# Patient Record
Sex: Male | Born: 1935 | Race: White | Hispanic: No | Marital: Married | Smoking: Former smoker
Health system: Southern US, Community
[De-identification: ages and names within clinical notes are randomized; demographics above are authoritative.]

## PROBLEM LIST (undated history)

## (undated) DIAGNOSIS — I10 Essential (primary) hypertension: Secondary | ICD-10-CM

## (undated) DIAGNOSIS — H3530 Unspecified macular degeneration: Secondary | ICD-10-CM

## (undated) DIAGNOSIS — G629 Polyneuropathy, unspecified: Secondary | ICD-10-CM

## (undated) DIAGNOSIS — T148XXA Other injury of unspecified body region, initial encounter: Secondary | ICD-10-CM

## (undated) DIAGNOSIS — S0291XA Unspecified fracture of skull, initial encounter for closed fracture: Secondary | ICD-10-CM

## (undated) DIAGNOSIS — J40 Bronchitis, not specified as acute or chronic: Secondary | ICD-10-CM

## (undated) DIAGNOSIS — E1142 Type 2 diabetes mellitus with diabetic polyneuropathy: Secondary | ICD-10-CM

## (undated) DIAGNOSIS — E7800 Pure hypercholesterolemia, unspecified: Secondary | ICD-10-CM

## (undated) DIAGNOSIS — M5030 Other cervical disc degeneration, unspecified cervical region: Secondary | ICD-10-CM

## (undated) DIAGNOSIS — S065X9A Traumatic subdural hemorrhage with loss of consciousness of unspecified duration, initial encounter: Secondary | ICD-10-CM

## (undated) DIAGNOSIS — S065XAA Traumatic subdural hemorrhage with loss of consciousness status unknown, initial encounter: Secondary | ICD-10-CM

## (undated) DIAGNOSIS — R911 Solitary pulmonary nodule: Secondary | ICD-10-CM

## (undated) DIAGNOSIS — I723 Aneurysm of iliac artery: Secondary | ICD-10-CM

## (undated) DIAGNOSIS — R269 Unspecified abnormalities of gait and mobility: Secondary | ICD-10-CM

## (undated) DIAGNOSIS — J449 Chronic obstructive pulmonary disease, unspecified: Secondary | ICD-10-CM

## (undated) HISTORY — DX: Other injury of unspecified body region, initial encounter: T14.8XXA

## (undated) HISTORY — DX: Traumatic subdural hemorrhage with loss of consciousness status unknown, initial encounter: S06.5XAA

## (undated) HISTORY — DX: Type 2 diabetes mellitus with diabetic polyneuropathy: E11.42

## (undated) HISTORY — DX: Unspecified macular degeneration: H35.30

## (undated) HISTORY — DX: Traumatic subdural hemorrhage with loss of consciousness of unspecified duration, initial encounter: S06.5X9A

## (undated) HISTORY — DX: Chronic obstructive pulmonary disease, unspecified: J44.9

## (undated) HISTORY — DX: Aneurysm of iliac artery: I72.3

## (undated) HISTORY — DX: Solitary pulmonary nodule: R91.1

## (undated) HISTORY — DX: Polyneuropathy, unspecified: G62.9

## (undated) HISTORY — DX: Pure hypercholesterolemia, unspecified: E78.00

## (undated) HISTORY — DX: Unspecified abnormalities of gait and mobility: R26.9

## (undated) HISTORY — DX: Other cervical disc degeneration, unspecified cervical region: M50.30

## (undated) NOTE — *Deleted (*Deleted)
Below is our plan:  We will ***  Please make sure you are staying well hydrated. I recommend 50-60 ounces daily. Well balanced diet and regular exercise encouraged.    Please continue follow up with care team as directed.   Follow up   You may receive a survey regarding today's visit. I encourage you to leave honest feed back as I do use this information to improve patient care. Thank you for seeing me today!       Peripheral Neuropathy Peripheral neuropathy is a type of nerve damage. It affects nerves that carry signals between the spinal cord and the arms, legs, and the rest of the body (peripheral nerves). It does not affect nerves in the spinal cord or brain. In peripheral neuropathy, one nerve or a group of nerves may be damaged. Peripheral neuropathy is a broad category that includes many specific nerve disorders, like diabetic neuropathy, hereditary neuropathy, and carpal tunnel syndrome. What are the causes? This condition may be caused by:  Diabetes. This is the most common cause of peripheral neuropathy.  Nerve injury.  Pressure or stress on a nerve that lasts a long time.  Lack (deficiency) of B vitamins. This can result from alcoholism, poor diet, or a restricted diet.  Infections.  Autoimmune diseases, such as rheumatoid arthritis and systemic lupus erythematosus.  Nerve diseases that are passed from parent to child (inherited).  Some medicines, such as cancer medicines (chemotherapy).  Poisonous (toxic) substances, such as lead and mercury.  Too little blood flowing to the legs.  Kidney disease.  Thyroid disease. In some cases, the cause of this condition is not known. What are the signs or symptoms? Symptoms of this condition depend on which of your nerves is damaged. Common symptoms include:  Loss of feeling (numbness) in the feet, hands, or both.  Tingling in the feet, hands, or both.  Burning pain.  Very sensitive skin.  Weakness.  Not  being able to move a part of the body (paralysis).  Muscle twitching.  Clumsiness or poor coordination.  Loss of balance.  Not being able to control your bladder.  Feeling dizzy.  Sexual problems. How is this diagnosed? Diagnosing and finding the cause of peripheral neuropathy can be difficult. Your health care provider will take your medical history and do a physical exam. A neurological exam will also be done. This involves checking things that are affected by your brain, spinal cord, and nerves (nervous system). For example, your health care provider will check your reflexes, how you move, and what you can feel. You may have other tests, such as:  Blood tests.  Electromyogram (EMG) and nerve conduction tests. These tests check nerve function and how well the nerves are controlling the muscles.  Imaging tests, such as CT scans or MRI to rule out other causes of your symptoms.  Removing a small piece of nerve to be examined in a lab (nerve biopsy). This is rare.  Removing and examining a small amount of the fluid that surrounds the brain and spinal cord (lumbar puncture). This is rare. How is this treated? Treatment for this condition may involve:  Treating the underlying cause of the neuropathy, such as diabetes, kidney disease, or vitamin deficiencies.  Stopping medicines that can cause neuropathy, such as chemotherapy.  Medicine to relieve pain. Medicines may include: ? Prescription or over-the-counter pain medicine. ? Antiseizure medicine. ? Antidepressants. ? Pain-relieving patches that are applied to painful areas of skin.  Surgery to relieve pressure on a  nerve or to destroy a nerve that is causing pain.  Physical therapy to help improve movement and balance.  Devices to help you move around (assistive devices). Follow these instructions at home: Medicines  Take over-the-counter and prescription medicines only as told by your health care provider. Do not take  any other medicines without first asking your health care provider.  Do not drive or use heavy machinery while taking prescription pain medicine. Lifestyle   Do not use any products that contain nicotine or tobacco, such as cigarettes and e-cigarettes. Smoking keeps blood from reaching damaged nerves. If you need help quitting, ask your health care provider.  Avoid or limit alcohol. Too much alcohol can cause a vitamin B deficiency, and vitamin B is needed for healthy nerves.  Eat a healthy diet. This includes: ? Eating foods that are high in fiber, such as fresh fruits and vegetables, whole grains, and beans. ? Limiting foods that are high in fat and processed sugars, such as fried or sweet foods. General instructions   If you have diabetes, work closely with your health care provider to keep your blood sugar under control.  If you have numbness in your feet: ? Check every day for signs of injury or infection. Watch for redness, warmth, and swelling. ? Wear padded socks and comfortable shoes. These help protect your feet.  Develop a good support system. Living with peripheral neuropathy can be stressful. Consider talking with a mental health specialist or joining a support group.  Use assistive devices and attend physical therapy as told by your health care provider. This may include using a walker or a cane.  Keep all follow-up visits as told by your health care provider. This is important. Contact a health care provider if:  You have new signs or symptoms of peripheral neuropathy.  You are struggling emotionally from dealing with peripheral neuropathy.  Your pain is not well-controlled. Get help right away if:  You have an injury or infection that is not healing normally.  You develop new weakness in an arm or leg.  You fall frequently. Summary  Peripheral neuropathy is when the nerves in the arms, or legs are damaged, resulting in numbness, weakness, or pain.  There  are many causes of peripheral neuropathy, including diabetes, pinched nerves, vitamin deficiencies, autoimmune disease, and hereditary conditions.  Diagnosing and finding the cause of peripheral neuropathy can be difficult. Your health care provider will take your medical history, do a physical exam, and do tests, including blood tests and nerve function tests.  Treatment involves treating the underlying cause of the neuropathy and taking medicines to help control pain. Physical therapy and assistive devices may also help. This information is not intended to replace advice given to you by your health care provider. Make sure you discuss any questions you have with your health care provider. Document Revised: 06/13/2017 Document Reviewed: 09/09/2016 Elsevier Patient Education  2020 [employer].

---

## 1999-02-21 ENCOUNTER — Encounter: Admission: RE | Admit: 1999-02-21 | Discharge: 1999-05-22 | Payer: Self-pay | Admitting: Emergency Medicine

## 2000-06-25 ENCOUNTER — Encounter: Admission: RE | Admit: 2000-06-25 | Discharge: 2000-06-25 | Payer: Self-pay | Admitting: General Surgery

## 2000-06-25 ENCOUNTER — Encounter: Payer: Self-pay | Admitting: General Surgery

## 2001-02-03 ENCOUNTER — Other Ambulatory Visit: Admission: RE | Admit: 2001-02-03 | Discharge: 2001-02-03 | Payer: Self-pay | Admitting: Urology

## 2001-07-31 ENCOUNTER — Ambulatory Visit (HOSPITAL_COMMUNITY): Admission: RE | Admit: 2001-07-31 | Discharge: 2001-07-31 | Payer: Self-pay | Admitting: Gastroenterology

## 2005-09-12 ENCOUNTER — Encounter: Admission: RE | Admit: 2005-09-12 | Discharge: 2005-09-12 | Payer: Self-pay | Admitting: Family Medicine

## 2005-10-23 ENCOUNTER — Encounter: Admission: RE | Admit: 2005-10-23 | Discharge: 2005-10-23 | Payer: Self-pay | Admitting: Emergency Medicine

## 2005-12-04 ENCOUNTER — Encounter: Admission: RE | Admit: 2005-12-04 | Discharge: 2005-12-04 | Payer: Self-pay | Admitting: Emergency Medicine

## 2006-01-26 ENCOUNTER — Encounter: Admission: RE | Admit: 2006-01-26 | Discharge: 2006-01-26 | Payer: Self-pay | Admitting: Family Medicine

## 2008-02-01 ENCOUNTER — Inpatient Hospital Stay (HOSPITAL_COMMUNITY): Admission: EM | Admit: 2008-02-01 | Discharge: 2008-02-05 | Payer: Self-pay | Admitting: Emergency Medicine

## 2008-02-02 ENCOUNTER — Ambulatory Visit: Payer: Self-pay | Admitting: Physical Medicine & Rehabilitation

## 2008-03-09 ENCOUNTER — Encounter: Admission: RE | Admit: 2008-03-09 | Discharge: 2008-03-09 | Payer: Self-pay | Admitting: Neurological Surgery

## 2008-06-27 ENCOUNTER — Encounter: Admission: RE | Admit: 2008-06-27 | Discharge: 2008-07-14 | Payer: Self-pay | Admitting: Emergency Medicine

## 2008-07-18 ENCOUNTER — Encounter: Admission: RE | Admit: 2008-07-18 | Discharge: 2008-10-16 | Payer: Self-pay | Admitting: Emergency Medicine

## 2008-10-18 ENCOUNTER — Encounter: Admission: RE | Admit: 2008-10-18 | Discharge: 2008-10-18 | Payer: Self-pay | Admitting: Neurology

## 2008-10-19 ENCOUNTER — Encounter: Admission: RE | Admit: 2008-10-19 | Discharge: 2008-10-19 | Payer: Self-pay | Admitting: Neurology

## 2008-12-07 ENCOUNTER — Encounter: Admission: RE | Admit: 2008-12-07 | Discharge: 2008-12-07 | Payer: Self-pay | Admitting: Neurology

## 2009-02-15 ENCOUNTER — Encounter: Admission: RE | Admit: 2009-02-15 | Discharge: 2009-03-21 | Payer: Self-pay | Admitting: Neurology

## 2009-11-05 ENCOUNTER — Emergency Department (HOSPITAL_BASED_OUTPATIENT_CLINIC_OR_DEPARTMENT_OTHER): Admission: EM | Admit: 2009-11-05 | Discharge: 2009-11-05 | Payer: Self-pay | Admitting: Emergency Medicine

## 2009-11-09 ENCOUNTER — Ambulatory Visit: Payer: Self-pay | Admitting: Diagnostic Radiology

## 2009-11-09 ENCOUNTER — Emergency Department (HOSPITAL_BASED_OUTPATIENT_CLINIC_OR_DEPARTMENT_OTHER): Admission: EM | Admit: 2009-11-09 | Discharge: 2009-11-09 | Payer: Self-pay | Admitting: Emergency Medicine

## 2010-10-02 LAB — CBC
HCT: 42.5 % (ref 39.0–52.0)
Hemoglobin: 14.5 g/dL (ref 13.0–17.0)
MCHC: 34.3 g/dL (ref 30.0–36.0)
MCV: 99.6 fL (ref 78.0–100.0)
Platelets: 238 K/uL (ref 150–400)
RBC: 4.26 MIL/uL (ref 4.22–5.81)
RDW: 12.4 % (ref 11.5–15.5)
WBC: 8.8 10*3/uL (ref 4.0–10.5)

## 2010-10-02 LAB — BASIC METABOLIC PANEL WITH GFR
CO2: 25 meq/L (ref 19–32)
Calcium: 9.4 mg/dL (ref 8.4–10.5)
Creatinine, Ser: 0.6 mg/dL (ref 0.4–1.5)
GFR calc Af Amer: >60 mL/min (ref >60)
GFR calc non Af Amer: >60 mL/min (ref >60)
Glucose, Bld: 160 mg/dL — ABNORMAL HIGH (ref 70–99)
Potassium: 4.9 meq/L (ref 3.5–5.1)

## 2010-10-02 LAB — DIFFERENTIAL
Basophils Absolute: 0 K/uL (ref 0.0–0.1)
Basophils Relative: 1 % (ref 0–1)
Eosinophils Absolute: 0 10*3/uL (ref 0.0–0.7)
Eosinophils Relative: 0 % (ref 0–5)
Lymphocytes Relative: 15 % (ref 12–46)
Lymphs Abs: 1.3 K/uL (ref 0.7–4.0)
Monocytes Absolute: 0.6 10*3/uL (ref 0.1–1.0)
Monocytes Relative: 7 % (ref 3–12)
Neutro Abs: 6.9 10*3/uL (ref 1.7–7.7)
Neutrophils Relative %: 77 % (ref 43–77)

## 2010-10-02 LAB — POCT B-TYPE NATRIURETIC PEPTIDE (BNP): B Natriuretic Peptide, POC: 12 pg/mL (ref 0–100)

## 2010-10-02 LAB — BASIC METABOLIC PANEL
BUN: 22 mg/dL (ref 6–23)
Chloride: 98 mEq/L (ref 96–112)
Sodium: 135 mEq/L (ref 135–145)

## 2010-10-02 LAB — POCT CARDIAC MARKERS
CKMB, poc: 1 ng/mL (ref 1.0–8.0)
Myoglobin, poc: 78.1 ng/mL (ref 12–200)
Troponin i, poc: <0.05 ng/mL (ref 0.00–0.09)

## 2010-11-27 NOTE — Discharge Summary (Signed)
NAMELORENCE, [name]              ACCOUNT NO.:  [id number]   MEDICAL RECORD NO.:  [id number]          PATIENT TYPE:  INP   LOCATION:  3001                         FACILITY:  [facility]   PHYSICIAN:  Stefani Dama, M.D.  DATE OF BIRTH:  [birth year]   DATE OF ADMISSION:  01/31/2008  DATE OF DISCHARGE:  02/05/2008                               DISCHARGE SUMMARY   ADMITTING DIAGNOSES:  1. Left subdural hematoma with tentorial subdural hematoma.  2. Right skull fracture.  3. Alcohol intoxication.  4. Multiple small facial abrasions and a contusion to the right eye.  5. Right elbow contusion.   DISCHARGE DIAGNOSES:  1. Left subdural hematoma with tentorial subdural hematoma.  2. Right skull fracture.  3. Alcohol intoxication.  4. Multiple small facial abrasions and a contusion to the right eye.  5. Right elbow contusion.  6. Left temporal contusion of the brain.   SECONDARY DIAGNOSES:  1. Tobacco abuse.  2. Alcohol abuse.  3. History of hypertension.   OPERATIONS AND PROCEDURES:  None.   BRIEF HISTORY:  The patient is a 75 year old male who fell while taking  out the garbage.  Upon arrival to the emergency room, had a blood  alcohol level of 177.  Brought by EMS, the patient was disoriented and  combative.  CT scan showed a right frontal skull fracture, left subdural  hematoma and tentorial subdural hematoma.  He was admitted to the  neurosurgical ICU.  He was oriented, but repeating himself continuously.  On the first day of his hospitalization, he was doing a little bit  better.  He was neurovascularly intact.  Cranial nerves 2-12 are intact.  He did have some mild double vision that resolved after 2 to 3 days.  His right eye was significantly swollen with sclera injected with blood  and a facial abrasion.  He was admitted for observation.  Physical  therapy and occupational therapy was consulted.  He was sent for a new  CT scan on February 01, 2008.  The new CT scan showed a left  temporal  contusion to the brain.  No change in the subdural hematoma.  No new  bleeding otherwise.  He continued to be observed as he was transferred  to 3000 out of the neurosurgical ICU as he was neurologically stable.  A  discharge plan was arranged.  He was found to need assistance, rehab  versus skilled nursing facility due to insurance issues.  He was not  accepted to inpatient rehab, and it was felt best that he would benefit  from short stay with skilled nursing facility due to his slow  progression with physical therapy and occupational therapy.  He was  eating well, voiding well.  Resolution of his double vision has  occurred.  He is ready for discharge on February 05, 2008.   DISCHARGE CONDITION:  Stable and improved.   DISCHARGE INSTRUCTIONS:  Discharge to skilled nursing facility,  [facility].  Priority discharge summary dictated.  Continue physical therapy and occupational therapy for general  conditioning, coordination, balance, gait training, and activities of  daily living.  He will follow up with Dr. Danielle Dess in 3 to 4 weeks as an  outpatient.  He was complaining of some mild left ear problems, which he  has had prior to this fall.  He will see an ENT if this continues to  bother him.  This can be done as an outpatient as well.   DISCHARGE MEDICATIONS:  1. Lisinopril 25 mg q. a.m.  2  Advair 250 mg 1 puff q. a.m. and q. p.m.  1. Metoprolol 35 mg p.o. q. a.m.  2. Multivitamin p.o. daily.  3. An 81 mg aspirin q. a.m.  4. Tylenol #3 one p.o. q.6 to 8 hours p.r.n. pain.  5. The patient had been placed on Ativan protocol during his      hospitalization due to his alcohol intoxication.   He is alert and oriented at the time of discharge.  Regular diet.  Contact our office prior to follow up if they have any questions or  concerns.      Aura Fey Bobbe Medico.      Stefani Dama, M.D.  Electronically Signed    SCI/MEDQ  D:  02/05/2008  T:   02/05/2008  Job:  [id number]

## 2010-11-27 NOTE — H&P (Signed)
[name], James NO.:  [id number]   MEDICAL RECORD NO.:  [id number]          PATIENT TYPE:  INP   LOCATION:                                FACILITY:  [facility]   PHYSICIAN:  Stefani Dama, M.D.  DATE OF BIRTH:  1935-09-01   DATE OF ADMISSION:  02/01/2008  DATE OF DISCHARGE:                              HISTORY & PHYSICAL   ADMITTING DIAGNOSES:  1. Acute subdural hematoma on the left with tentorial subdural      hematoma.  2. Right frontal skull fracture.  3. Alcohol intoxication.   HISTORY OF PRESENT ILLNESS:  [name] is a 75 year old right-  handed Hispanic male who apparently was taking out the garbage this  evening when he apparently tripped and fell, striking his head and his  right elbow, causing a gash onto the right posterior occiput.  He was  seen in the emergency department where his lacerations were sewn, the  contusions were evaluated, and a CT scan of the brain was performed.  CT  scan demonstrates presence of a left-sided subdural hematoma over the  temporoparietal convexity.  This measures 6 mm in maximum thickness.  There is blood over the tentorium also on the left side.  There is a  right-sided skull fracture which is nondisplaced in the frontotemporal  region.  The patient is now admitted to the hospital for further  monitoring.  On initial evaluation in the emergency department, it was  noted that the patient was combative, disoriented, and agitated.  He  gradually settled down.  His initial blood alcohol level was 177.   PAST MEDICAL HISTORY:  Significant for smoking 1 pack cigarettes a day  and drinks scotch on a daily basis.  He has an underlying history of  hypertension.   CURRENT MEDICATIONS:  Metoprolol, lisinopril, Advair, and aspirin.  He  also notes that he uses a multivitamin.   SOCIAL HISTORY:  He is married and lives with his wife who accompanies  him on this visit.   PHYSICAL EXAMINATION:  GENERAL:  At the current,  he is alert and  oriented, though he constantly repeats himself, complains of headache  and head pain, requesting pain medication.  HEENT:  He has abrasions and a sewn laceration along her right parieto-  occipital region over the scalp.  He has injection of the sclerae on the  right side with ecchymoses that is forming over the lateral aspect of  the orbit in the zygomatic region.  His pupils are 3 mm equally and  reactive to light and accommodation.  Extraocular movements appear full.  Face is symmetric.  Tongue and uvula are midline.  Sclerae and  conjunctivae are as noted with injection on the right side.  NECK:  Supple.  Range of motion is good.  MUSCULOSKELETAL:  His motor strength in the upper and lower extremities  is within the limits of normal.  Deep tendon reflexes are 1+ in the  biceps and triceps, 1+ in the patellae and the Achilles.  Babinski are  downgoing.  There is a significant bruise and contusion about the  right  elbow.  He complains of some chest wall pain.  LUNGS:  Clear to auscultation.  HEART:  Regular rate and rhythm.  No murmurs are heard.  ABDOMEN:  Soft.  Bowel sounds are positive.  No masses are noted.  EXTREMITIES:  No cyanosis, clubbing, or edema, although there is a  number of scrapes in and about the knees and the shins on both lower  extremities.   IMPRESSION:  The patient has evidence of a subdural hematoma and a skull  fracture secondary to an apparent fall.  He has also evidence of alcohol  intoxication with a blood alcohol level of 177.  He is now being  admitted for observation.  A repeat CT scan will be obtained.  The  patient's wife is questioning whether the fall could be secondary to a  mini-stroke or some other abnormality.  I explained that the most likely  source is related to the alcohol usage and I discouraged this for the  near time as the patient needs to recover from his subdural hematoma.  I  explained to them the risk of seizure with  the presence of closed head  injury including some blood on the surface of the brain.  These issues  will be reinforced in the future.      Stefani Dama, M.D.  Electronically Signed     HJE/MEDQ  D:  02/01/2008  T:  02/01/2008  Job:  [id number]

## 2010-11-30 NOTE — Procedures (Signed)
[facility]  Patient:    [name], [name] Visit Number: [id number] MRN: [id number]          Service Type: END Location: ENDO Attending Physician:  Rich Brave Dictated by:   Florencia Reasons, M.D. Proc. Date: 07/31/01 Admit Date:  07/31/2001 Discharge Date: 07/31/2001   CC:         Onalee Hua B. Georgina Pillion, M.D.   Procedure Report  PROCEDURE:  Colonoscopy.  INDICATION:  Screening for colon cancer in an asymptomatic 75 year old.  FINDINGS:  Normal exam to the cecum.  DESCRIPTION OF PROCEDURE:  The nature, purpose, and risks of the procedure had been described to the patient, who provided written consent.  Sedation was fentanyl 87.5 mcg and Versed 10 mg IV without arrhythmias or desaturation. There were some perianal skin tags, but these did not look like condylomata to me.  Digital exam of the prostate was normal.  The Olympus adult video colonoscope was advanced quite easily to the cecum, turning the patient into the supine position, applying some external abdominal compression, and overriding and taking out loops to reach the base of the cecum.  Pullback was then performed.  The quality of the prep was excellent, and it is felt that all areas were well seen.  This was a normal examination.  No polyps, cancer, colitis, vascular malformations, or diverticular disease were observed, and retroflexion of the rectum was normal.  No biopsies were obtained.  The patient tolerated the procedure well, and there were no apparent complications.  IMPRESSION:  Normal screening colonoscopy.  PLAN:  Follow-up colonoscopy in five years. Dictated by:   Florencia Reasons, M.D. Attending Physician:  Rich Brave DD:  07/31/01 TD:  08/02/01 Job: [id number]

## 2011-04-12 LAB — CBC
MCV: 99.2
RDW: 12.6
RDW: 13.2
WBC: 10.9 — ABNORMAL HIGH
WBC: 13.2 — ABNORMAL HIGH

## 2011-04-12 LAB — PROTIME-INR
INR: 1
Prothrombin Time: 13.6

## 2011-04-12 LAB — ETHANOL: Alcohol, Ethyl (B): 177 — ABNORMAL HIGH

## 2011-04-12 LAB — BASIC METABOLIC PANEL: GFR calc non Af Amer: >60

## 2011-04-24 ENCOUNTER — Other Ambulatory Visit: Payer: Self-pay | Admitting: Family Medicine

## 2011-04-24 ENCOUNTER — Ambulatory Visit
Admission: RE | Admit: 2011-04-24 | Discharge: 2011-04-24 | Disposition: A | Discharge status: Home / Self Care | Payer: Medicare Other | Source: Ambulatory Visit | Attending: Family Medicine | Admitting: Family Medicine

## 2011-04-24 DIAGNOSIS — G8929 Other chronic pain: Secondary | ICD-10-CM

## 2011-04-24 DIAGNOSIS — M542 Cervicalgia: Secondary | ICD-10-CM

## 2011-08-03 ENCOUNTER — Emergency Department (HOSPITAL_BASED_OUTPATIENT_CLINIC_OR_DEPARTMENT_OTHER)
Admission: EM | Admit: 2011-08-03 | Discharge: 2011-08-03 | Disposition: A | Discharge status: Home / Self Care | Payer: Medicare Other | Attending: Emergency Medicine | Admitting: Emergency Medicine

## 2011-08-03 ENCOUNTER — Encounter (HOSPITAL_BASED_OUTPATIENT_CLINIC_OR_DEPARTMENT_OTHER): Payer: Self-pay | Admitting: *Deleted

## 2011-08-03 ENCOUNTER — Emergency Department (INDEPENDENT_AMBULATORY_CARE_PROVIDER_SITE_OTHER): Payer: Medicare Other

## 2011-08-03 DIAGNOSIS — R0602 Shortness of breath: Secondary | ICD-10-CM

## 2011-08-03 DIAGNOSIS — E119 Type 2 diabetes mellitus without complications: Secondary | ICD-10-CM | POA: Insufficient documentation

## 2011-08-03 DIAGNOSIS — R05 Cough: Secondary | ICD-10-CM

## 2011-08-03 DIAGNOSIS — I10 Essential (primary) hypertension: Secondary | ICD-10-CM | POA: Insufficient documentation

## 2011-08-03 DIAGNOSIS — J45909 Unspecified asthma, uncomplicated: Secondary | ICD-10-CM | POA: Insufficient documentation

## 2011-08-03 DIAGNOSIS — F17200 Nicotine dependence, unspecified, uncomplicated: Secondary | ICD-10-CM | POA: Insufficient documentation

## 2011-08-03 DIAGNOSIS — J209 Acute bronchitis, unspecified: Secondary | ICD-10-CM | POA: Insufficient documentation

## 2011-08-03 DIAGNOSIS — R059 Cough, unspecified: Secondary | ICD-10-CM

## 2011-08-03 HISTORY — DX: Essential (primary) hypertension: I10

## 2011-08-03 HISTORY — DX: Unspecified fracture of skull, initial encounter for closed fracture: S02.91XA

## 2011-08-03 HISTORY — DX: Bronchitis, not specified as acute or chronic: J40

## 2011-08-03 MED ORDER — ALBUTEROL SULFATE (2.5 MG/3ML) 0.083% IN NEBU: 2.5000 mg | INHALATION_SOLUTION | Freq: Four times a day (QID) | RESPIRATORY_TRACT | Status: DC | PRN

## 2011-08-03 NOTE — ED Provider Notes (Signed)
History     CSN: [id number]  Arrival date & time 08/03/11  1226   None     No chief complaint on file.   (Consider location/radiation/quality/duration/timing/severity/associated sxs/prior treatment) Patient is a 76 y.o. male presenting with cough. The history is provided by the patient. No language interpreter was used.  Cough This is a new problem. The current episode started more than 1 week ago. The problem occurs constantly. The problem has been gradually worsening. The cough is productive of sputum. There has been no fever. Associated symptoms include rhinorrhea, shortness of breath and wheezing. He is not a smoker. His past medical history is significant for bronchitis and asthma.  Pt complains of a cough and congestion.  Pt reports he saw his MD and was started on Levaquin.  Pt reports this is his 6th day of Levaquin.  Pt has been using his wifes albuterol neblizer with some relief.  Past Medical History  Diagnosis Date  . Asthma   . Bronchitis   . Diabetes mellitus   . Hypertension   . Skull fracture     History reviewed. No pertinent past surgical history.  No family history on file.  History  Substance Use Topics  . Smoking status: Current Everyday Smoker    Types: Cigarettes  . Smokeless tobacco: Never Used  . Alcohol Use: 7.0 oz/week    14 drink(s) per week      Review of Systems  HENT: Positive for rhinorrhea.   Respiratory: Positive for cough, shortness of breath and wheezing.   All other systems reviewed and are negative.    Allergies  Penicillins; Prednisone; and Sulfa antibiotics  Home Medications   Current Outpatient Rx  Name Route Sig Dispense Refill  . ALBUTEROL SULFATE HFA 108 (90 BASE) MCG/ACT IN AERS Inhalation Inhale 2 puffs into the lungs every 6 (six) hours as needed.    Marland Kitchen AMLODIPINE BESYLATE 5 MG PO TABS Oral Take 5 mg by mouth daily.    Marland Kitchen FLUTICASONE-SALMETEROL 250-50 MCG/DOSE IN AEPB Inhalation Inhale 1 puff into the lungs every  12 (twelve) hours.    Marland Kitchen HYDROCHLOROTHIAZIDE 12.5 MG PO TABS Oral Take 6.25 mg by mouth daily.    Marland Kitchen LISINOPRIL 20 MG PO TABS Oral Take 20 mg by mouth daily.      BP 134/74  Pulse 89  Temp(Src) 97.9 F (36.6 C) (Oral)  Resp 18  Ht 5\' 11"  (1.803 m)  Wt 200 lb (90.719 kg)  BMI 27.89 kg/m2  SpO2 98%  Physical Exam  Nursing note and vitals reviewed. Constitutional: He is oriented to person, place, and time. He appears well-developed and well-nourished.  HENT:  Head: Normocephalic and atraumatic.  Right Ear: External ear normal.  Left Ear: External ear normal.  Nose: Nose normal.  Mouth/Throat: Oropharynx is clear and moist.  Eyes: Conjunctivae and EOM are normal. Pupils are equal, round, and reactive to light.  Neck: Normal range of motion. Neck supple.  Cardiovascular: Normal rate and normal heart sounds.   Pulmonary/Chest: Effort normal.  Abdominal: Soft. Bowel sounds are normal.  Musculoskeletal: Normal range of motion.  Neurological: He is alert and oriented to person, place, and time. He has normal reflexes.  Skin: Skin is warm.  Psychiatric: He has a normal mood and affect.    ED Course  Procedures (including critical care time)  Labs Reviewed - No data to display No results found.   No diagnosis found.    MDM  Pt advised continue albuterol nebs,  Take  levaquin.  See your Physician for recheck on Monday        Langston Masker, [place] 08/03/11 1518  Langston Masker, [place] 08/03/11 [phone]

## 2011-08-03 NOTE — ED Notes (Signed)
Dx with bronchitis last Sunday and started on levaquin- states feeling SOB- used home neb this morning without relief

## 2011-08-03 NOTE — ED Provider Notes (Signed)
This 76 year old male has been coughing for a week on antibiotics his lungs are currently clear and has only used his wife's nebulizer treatment a few times over the last week and has not had to use his inhaler, he is a nontoxic appearance and is easily speaking full sentences without respiratory distress during my examination.  Medical screening examination/treatment/procedure(s) were conducted as a shared visit with non-physician practitioner(s) and myself.  I personally evaluated the patient during the encounter  Hurman Horn, MD 08/06/11 1651

## 2011-08-03 NOTE — ED Notes (Signed)
Patient expressed concern over wait, informed them of status and that they were next for radiology

## 2011-08-04 NOTE — ED Provider Notes (Signed)
Medical screening examination/treatment/procedure(s) were conducted as a shared visit with non-physician practitioner(s) and myself.  I personally evaluated the patient during the encounter  Hurman Horn, MD 08/04/11 [phone]

## 2012-02-27 ENCOUNTER — Emergency Department (HOSPITAL_BASED_OUTPATIENT_CLINIC_OR_DEPARTMENT_OTHER)
Admission: EM | Admit: 2012-02-27 | Discharge: 2012-02-28 | Disposition: A | Discharge status: Home / Self Care | Payer: Medicare Other | Attending: Emergency Medicine | Admitting: Emergency Medicine

## 2012-02-27 ENCOUNTER — Encounter (HOSPITAL_BASED_OUTPATIENT_CLINIC_OR_DEPARTMENT_OTHER): Payer: Self-pay | Admitting: *Deleted

## 2012-02-27 DIAGNOSIS — J45909 Unspecified asthma, uncomplicated: Secondary | ICD-10-CM | POA: Insufficient documentation

## 2012-02-27 DIAGNOSIS — Z880 Allergy status to penicillin: Secondary | ICD-10-CM | POA: Insufficient documentation

## 2012-02-27 DIAGNOSIS — J40 Bronchitis, not specified as acute or chronic: Secondary | ICD-10-CM | POA: Insufficient documentation

## 2012-02-27 DIAGNOSIS — F17200 Nicotine dependence, unspecified, uncomplicated: Secondary | ICD-10-CM | POA: Insufficient documentation

## 2012-02-27 DIAGNOSIS — Z881 Allergy status to other antibiotic agents status: Secondary | ICD-10-CM | POA: Insufficient documentation

## 2012-02-27 DIAGNOSIS — I10 Essential (primary) hypertension: Secondary | ICD-10-CM | POA: Insufficient documentation

## 2012-02-27 DIAGNOSIS — F1010 Alcohol abuse, uncomplicated: Secondary | ICD-10-CM | POA: Insufficient documentation

## 2012-02-27 DIAGNOSIS — R339 Retention of urine, unspecified: Secondary | ICD-10-CM | POA: Insufficient documentation

## 2012-02-27 DIAGNOSIS — Z7982 Long term (current) use of aspirin: Secondary | ICD-10-CM | POA: Insufficient documentation

## 2012-02-27 DIAGNOSIS — R319 Hematuria, unspecified: Secondary | ICD-10-CM | POA: Insufficient documentation

## 2012-02-27 DIAGNOSIS — E119 Type 2 diabetes mellitus without complications: Secondary | ICD-10-CM | POA: Insufficient documentation

## 2012-02-27 DIAGNOSIS — Z882 Allergy status to sulfonamides status: Secondary | ICD-10-CM | POA: Insufficient documentation

## 2012-02-27 NOTE — ED Notes (Signed)
Hematuria x 3 days. Hx of same off and on x 18 years. Was seen by his urologist yesterday for same. Tonight he is having urgency and urinary retention.

## 2012-02-27 NOTE — ED Provider Notes (Signed)
History     CSN: [id number]  Arrival date & time 02/27/12  2143   First MD Initiated Contact with Patient 02/27/12 2317      Chief Complaint  Patient presents with  . Hematuria    (Consider location/radiation/quality/duration/timing/severity/associated sxs/prior treatment) HPI This is a 76 year old male with 18 year history of intermittent hematuria for which no etiology has ever been determined. He began having hematuria, including the passage of clots, 2 days ago. He was seen in his urologists office yesterday but states at that time there was no blood in his urine. There was some microscopic hematuria noted. This morning he again developed hematuria and passed several clots. This evening after dinner he was unable to urinate and came to the ED here. He was able to void but again developed the feeling that he cannot empty his bladder. He states he feels he needs to void but cannot. There is only minimal discomfort associated with this. He denies fever, chills or abdominal pain.  Past Medical History  Diagnosis Date  . Asthma   . Bronchitis   . Diabetes mellitus   . Hypertension   . Skull fracture     History reviewed. No pertinent past surgical history.  No family history on file.  History  Substance Use Topics  . Smoking status: Current Everyday Smoker    Types: Cigarettes  . Smokeless tobacco: Never Used  . Alcohol Use: 7.0 oz/week    14 drink(s) per week      Review of Systems  All other systems reviewed and are negative.    Allergies  Prednisone; Penicillins; and Sulfa antibiotics  Home Medications   Current Outpatient Rx  Name Route Sig Dispense Refill  . AMLODIPINE BESYLATE 5 MG PO TABS Oral Take 5 mg by mouth daily.    Marland Kitchen VITAMIN C PO Oral Take 1 tablet by mouth daily.    . ASPIRIN 81 MG PO TABS Oral Take 81 mg by mouth daily.    Marland Kitchen CALCIUM CARBONATE ANTACID 500 MG PO CHEW Oral Chew 2 tablets by mouth once as needed. For indigestion    .  FLUTICASONE-SALMETEROL 250-50 MCG/DOSE IN AEPB Inhalation Inhale 1 puff into the lungs every 12 (twelve) hours.    Marland Kitchen HYDROCHLOROTHIAZIDE 12.5 MG PO TABS Oral Take 6.25 mg by mouth daily.    Marland Kitchen LISINOPRIL 20 MG PO TABS Oral Take 20 mg by mouth daily.    Marland Kitchen MENTHOL (TOPICAL ANALGESIC) 7.5 % (ROLL) EX MISC Apply externally Apply 1 each topically daily as needed. For muscle tension    . ADULT MULTIVITAMIN W/MINERALS CH Oral Take 1 tablet by mouth daily.    Marland Kitchen PRESCRIPTION MEDICATION Both Eyes Place 1 drop into both eyes daily as needed. Allergy eye drop    . VITAMIN B-12 1000 MCG PO TABS Oral Take 1,000 mcg by mouth daily.    . ALBUTEROL SULFATE HFA 108 (90 BASE) MCG/ACT IN AERS Inhalation Inhale 2 puffs into the lungs every 6 (six) hours as needed. For shortness of breath    . ALBUTEROL SULFATE (2.5 MG/3ML) 0.083% IN NEBU Nebulization Take 3 mLs (2.5 mg total) by nebulization every 6 (six) hours as needed for wheezing. 75 mL 12    BP 138/75  Pulse 103  Temp 97.9 F (36.6 C) (Oral)  Resp 20  SpO2 98%  Physical Exam General: Well-developed, well-nourished male in no acute distress; appearance consistent with age of record HENT: normocephalic, atraumatic Eyes: Normal appearance Neck: supple Heart: regular rate and  rhythm Lungs: clear to auscultation bilaterally Abdomen: soft; nondistended; nontender; bowel sounds present GU: mildly distended bladder Extremities: No deformity; full range of motion Neurologic: Awake, alert and oriented; motor function intact in all extremities and symmetric; no facial droop Skin: Warm and dry Psychiatric: Normal mood and affect    ED Course  Procedures (including critical care time)     MDM   Nursing notes and vitals signs, including pulse oximetry, reviewed.  Summary of this visit's results, reviewed by myself:  Labs:  Results for orders placed during the hospital encounter of 02/27/12  URINALYSIS, WITH MICROSCOPIC      Component Value Range    Color, Urine RED (*) YELLOW   APPearance CLOUDY (*) CLEAR   Specific Gravity, Urine 1.012  1.005 - 1.030   pH 6.0  5.0 - 8.0   Glucose, UA NEGATIVE  NEGATIVE mg/dL   Hgb urine dipstick LARGE (*) NEGATIVE   Bilirubin Urine NEGATIVE  NEGATIVE   Ketones, ur NEGATIVE  NEGATIVE mg/dL   Protein, ur 161 (*) NEGATIVE mg/dL   Urobilinogen, UA 0.2  0.0 - 1.0 mg/dL   Nitrite NEGATIVE  NEGATIVE   Leukocytes, UA NEGATIVE  NEGATIVE   WBC, UA 0-2  <3 WBC/hpf   RBC / HPF TOO NUMEROUS TO COUNT  <3 RBC/hpf   Bacteria, UA RARE  RARE   Squamous Epithelial / LPF RARE  RARE   12:39 AM Coud catheter placed by nursing staff. 1300 mL postvoid residual was obtained. We'll send patient home with leg bag and have him followup with his urologist, Dr. Isabel Caprice.         Hanley Seamen, MD 02/28/12 [phone]

## 2012-02-28 LAB — URINALYSIS, MICROSCOPIC ONLY
Leukocytes, UA: NEGATIVE
Protein, ur: 100 mg/dL — AB
Specific Gravity, Urine: 1.012 (ref 1.005–1.030)
Urobilinogen, UA: 0.2 mg/dL (ref 0.0–1.0)

## 2012-02-28 NOTE — ED Notes (Signed)
Windell Moulding, RN reviewed care of leg bag with pt and significant other.

## 2012-02-28 NOTE — ED Notes (Signed)
First attempt at foley catheter placement by EMT was unsuccessful. Could not get past the prostate.  Verbal order from Dr. Read Drivers to attempt coude catheter placement by nurse.  Coude inserted successfully on first attempt.  Less than 5 ml urine returned.  Catheter irrigated with approx 60ml NS.  Urine immediately began to flow and 1300 ml red urine returned into gravity drainage bag. Pt tolerated the procedure well.  Expressed instant relief.

## 2012-02-29 LAB — URINE CULTURE
Colony Count: NO GROWTH
Culture: NO GROWTH

## 2012-03-04 ENCOUNTER — Emergency Department (HOSPITAL_BASED_OUTPATIENT_CLINIC_OR_DEPARTMENT_OTHER): Payer: Medicare Other

## 2012-03-04 ENCOUNTER — Emergency Department (HOSPITAL_BASED_OUTPATIENT_CLINIC_OR_DEPARTMENT_OTHER)
Admission: EM | Admit: 2012-03-04 | Discharge: 2012-03-05 | Disposition: A | Discharge status: Home / Self Care | Payer: Medicare Other | Attending: Emergency Medicine | Admitting: Emergency Medicine

## 2012-03-04 ENCOUNTER — Encounter (HOSPITAL_BASED_OUTPATIENT_CLINIC_OR_DEPARTMENT_OTHER): Payer: Self-pay | Admitting: *Deleted

## 2012-03-04 DIAGNOSIS — R739 Hyperglycemia, unspecified: Secondary | ICD-10-CM

## 2012-03-04 DIAGNOSIS — E119 Type 2 diabetes mellitus without complications: Secondary | ICD-10-CM | POA: Insufficient documentation

## 2012-03-04 DIAGNOSIS — R319 Hematuria, unspecified: Secondary | ICD-10-CM | POA: Insufficient documentation

## 2012-03-04 DIAGNOSIS — I10 Essential (primary) hypertension: Secondary | ICD-10-CM | POA: Insufficient documentation

## 2012-03-04 DIAGNOSIS — R109 Unspecified abdominal pain: Secondary | ICD-10-CM | POA: Insufficient documentation

## 2012-03-04 DIAGNOSIS — I723 Aneurysm of iliac artery: Secondary | ICD-10-CM | POA: Insufficient documentation

## 2012-03-04 DIAGNOSIS — E871 Hypo-osmolality and hyponatremia: Secondary | ICD-10-CM | POA: Insufficient documentation

## 2012-03-04 LAB — CBC WITH DIFFERENTIAL/PLATELET
Basophils Absolute: 0.1 10*3/uL (ref 0.0–0.1)
HCT: 35 % — ABNORMAL LOW (ref 39.0–52.0)
Lymphs Abs: 2.2 10*3/uL (ref 0.7–4.0)
MCH: 34.2 pg — ABNORMAL HIGH (ref 26.0–34.0)
MCV: 92.1 fL (ref 78.0–100.0)
Monocytes Absolute: 0.8 10*3/uL (ref 0.1–1.0)
Monocytes Relative: 11 % (ref 3–12)
Neutro Abs: 4 10*3/uL (ref 1.7–7.7)
RDW: 11.9 % (ref 11.5–15.5)
WBC: 7.3 10*3/uL (ref 4.0–10.5)

## 2012-03-04 LAB — BASIC METABOLIC PANEL
BUN: 18 mg/dL (ref 6–23)
CO2: 25 mEq/L (ref 19–32)
Chloride: 93 mEq/L — ABNORMAL LOW (ref 96–112)
Creatinine, Ser: 0.5 mg/dL (ref 0.50–1.35)
GFR calc Af Amer: >90 mL/min (ref >90)

## 2012-03-04 LAB — URINALYSIS, ROUTINE W REFLEX MICROSCOPIC
Nitrite: NEGATIVE
Protein, ur: 100 mg/dL — AB
Specific Gravity, Urine: 1.011 (ref 1.005–1.030)
Urobilinogen, UA: 0.2 mg/dL (ref 0.0–1.0)

## 2012-03-04 LAB — URINE MICROSCOPIC-ADD ON

## 2012-03-04 MED ORDER — DOXYCYCLINE HYCLATE 100 MG PO TABS: 100.0000 mg | ORAL_TABLET | Freq: Once | ORAL | Status: DC

## 2012-03-04 MED ORDER — DOXYCYCLINE HYCLATE 100 MG PO CAPS: 100.0000 mg | ORAL_CAPSULE | Freq: Two times a day (BID) | ORAL | Status: AC

## 2012-03-04 NOTE — ED Notes (Signed)
Pt has foley placed here last week- tonight has hematuria in leg bag, urine clear- appt with urology tomorrow

## 2012-03-04 NOTE — ED Provider Notes (Signed)
History     CSN: [id number]  Arrival date & time 03/04/12  2133   First MD Initiated Contact with Patient 03/04/12 2258      Chief Complaint  Patient presents with  . Hematuria    (Consider location/radiation/quality/duration/timing/severity/associated sxs/prior treatment) Patient is a 76 y.o. male presenting with hematuria. The history is provided by the patient. No language interpreter was used.  Hematuria This is a recurrent (started a week ago but has had several times in the past 11 years never able to find the source) problem. The current episode started in the past 7 days. The problem has been gradually worsening since onset. He describes the hematuria as gross hematuria. The hematuria occurs throughout his entire urinary stream. He reports no clotting in his urine stream. His pain is at a severity of 0/10. He is experiencing no pain. He describes his urine color as light pink (dark red earlier now pink). Obstructive symptoms do not include straining. Pertinent negatives include no dysuria, fever, flank pain, nausea or vomiting. His sexual activity is non-contributory to the current illness. His past medical history is significant for hypertension.  Had foley placed last week for same with retention bleeding initially improved now worse.  No f/c/r.  States urine cytology was negative for malignancy and is scheduled to see Dr. Isabel Caprice in the am.  No rashes on the skin.  Has had clots in the urine but not now.  No abdominal pain no n/v/d.    Past Medical History  Diagnosis Date  . Asthma   . Bronchitis   . Diabetes mellitus   . Hypertension   . Skull fracture     History reviewed. No pertinent past surgical history.  No family history on file.  History  Substance Use Topics  . Smoking status: Current Everyday Smoker    Types: Cigarettes  . Smokeless tobacco: Never Used  . Alcohol Use: 7.0 oz/week    14 drink(s) per week      Review of Systems  Constitutional: Negative  for fever.  Gastrointestinal: Negative for nausea and vomiting.  Genitourinary: Positive for hematuria. Negative for dysuria, flank pain, discharge and penile pain.  All other systems reviewed and are negative.    Allergies  Prednisone; Penicillins; and Sulfa antibiotics  Home Medications   Current Outpatient Rx  Name Route Sig Dispense Refill  . AMLODIPINE BESYLATE 5 MG PO TABS Oral Take 5 mg by mouth daily.    Marland Kitchen VITAMIN C PO Oral Take 1 tablet by mouth daily.    . ASPIRIN 81 MG PO TABS Oral Take 81 mg by mouth daily.    Marland Kitchen CALCIUM CARBONATE ANTACID 500 MG PO CHEW Oral Chew 2 tablets by mouth once as needed. For indigestion    . FLUTICASONE-SALMETEROL 250-50 MCG/DOSE IN AEPB Inhalation Inhale 1 puff into the lungs every 12 (twelve) hours.    Marland Kitchen HYDROCHLOROTHIAZIDE 12.5 MG PO TABS Oral Take 6.25 mg by mouth daily.    Marland Kitchen LISINOPRIL 20 MG PO TABS Oral Take 20 mg by mouth daily.    Marland Kitchen MENTHOL (TOPICAL ANALGESIC) 7.5 % (ROLL) EX MISC Apply externally Apply 1 each topically daily as needed. For muscle tension    . ADULT MULTIVITAMIN W/MINERALS CH Oral Take 1 tablet by mouth daily.    Marland Kitchen PRESCRIPTION MEDICATION Both Eyes Place 1 drop into both eyes daily as needed. Allergy eye drop    . TAMSULOSIN HCL 0.4 MG PO CAPS Oral Take 0.4 mg by mouth daily.    Marland Kitchen  VITAMIN B-12 1000 MCG PO TABS Oral Take 1,000 mcg by mouth daily.    Marland Kitchen DOXYCYCLINE HYCLATE 100 MG PO CAPS Oral Take 1 capsule (100 mg total) by mouth 2 (two) times daily. One po bid x 7 days 14 capsule 0    BP 157/72  Pulse 109  Temp 98.2 F (36.8 C) (Oral)  Resp 18  Ht 5\' 11"  (1.803 m)  Wt 200 lb (90.719 kg)  BMI 27.89 kg/m2  SpO2 98%  Physical Exam  Constitutional: He is oriented to person, place, and time. He appears well-developed and well-nourished. No distress.  HENT:  Head: Normocephalic.  Mouth/Throat: Oropharynx is clear and moist.  Eyes: Conjunctivae are normal. Pupils are equal, round, and reactive to light.  Neck: Normal  range of motion. Neck supple.  Cardiovascular: Normal rate and regular rhythm.   Pulmonary/Chest: Effort normal and breath sounds normal. He has no wheezes. He has no rales.  Abdominal: Soft. Bowel sounds are normal. There is no tenderness. There is no rebound and no guarding.  Musculoskeletal: Normal range of motion.  Neurological: He is alert and oriented to person, place, and time.  Skin: Skin is warm and dry.  Psychiatric: He has a normal mood and affect.    ED Course  Procedures (including critical care time)  Labs Reviewed  URINALYSIS, ROUTINE W REFLEX MICROSCOPIC - Abnormal; Notable for the following:    Color, Urine RED (*)  BIOCHEMICALS MAY BE AFFECTED BY COLOR   Glucose, UA 500 (*)     Hgb urine dipstick LARGE (*)     Protein, ur 100 (*)     Leukocytes, UA SMALL (*)     All other components within normal limits  CBC WITH DIFFERENTIAL - Abnormal; Notable for the following:    RBC 3.80 (*)     HCT 35.0 (*)     MCH 34.2 (*)     MCHC 37.1 (*)  RULED OUT INTERFERING SUBSTANCES   All other components within normal limits  BASIC METABOLIC PANEL - Abnormal; Notable for the following:    Sodium 129 (*)     Chloride 93 (*)     Glucose, Bld 201 (*)     All other components within normal limits  URINE MICROSCOPIC-ADD ON   Ct Abdomen Pelvis Wo Contrast  03/04/2012  *RADIOLOGY REPORT*  Clinical Data: Left flank pain.  Hematuria.  Foley catheter placed last week.  CT ABDOMEN AND PELVIS WITHOUT CONTRAST  Technique:  Multidetector CT imaging of the abdomen and pelvis was performed following the standard protocol without intravenous contrast.  Comparison: None.  Findings: 5 mm nodule in the right lung base is stable since previous chest CT 11/09/2009.  Lung bases are otherwise clear.  Minimal coronary artery calcification.  The kidneys appear symmetrical in size and shape.  No pyelocaliectasis or ureterectasis.  No renal or ureteral stones.  Stone in the gallbladder.  No bile duct  dilatation.  The unenhanced appearance of the liver, spleen, pancreas, and adrenal glands is unremarkable.  Scattered calcification in the abdominal aorta. 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 Retroperitoneal lymph nodes are present but not pathologically enlarged.  A small accessory spleen.  The stomach, small bowel, and colon are decompressed.  Diffusely stool filled colon.  No free air or free fluid in the abdomen.  Small umbilical hernia containing fat.  Pelvis:  Enlarged prostate gland measuring 6.2 x 3.8  cm.  A Foley catheter is in place with the balloon in the bladder base.  There is heterogeneous bladder wall thickening.  There is increased density material within the bladder suggesting hemorrhage.  Small amount of gas in the bladder consistent with catheter insertion. Findings may represent bladder infection versus other cause of bleeding.  Fat inguinal canals bilaterally.  No free or loculated pelvic fluid collections.  No significant pelvic lymphadenopathy. No evidence of diverticulitis.  The appendix is normal. Degenerative changes in the lumbar spine.  IMPRESSION: A Foley catheter in the bladder.  Diffuse bladder wall thickening which may represent infection or hypertrophy.  Increased density material in the bladder suggesting hemorrhage.  No ureteral stones or obstruction.  Incidental note of aneurysms of the right iliac artery and the celiac axis origin.  Cholelithiasis.  Stable right lung base nodule.   Original Report Authenticated By: Marlon Pel, M.D.      1. Hematuria   2. Hyponatremia   3. Hyperglycemia   4. Aneurysm artery, iliac       MDM  Hematuria with bladder hemorrhage and foley in place will treat for cystitis and patient has follow up with Dr. Isabel Caprice at 10 am today.  Patient informed of hyponatremia and states they have been reducing his HCTZ and elevation in sugar, likely  secondary to inflamation or infection.  Follow up with your PMD.  Informed of aneurysm finding and need to follow up with vascular surgery.  Patient and wife verbalize understanding and agree to follow up        Malai Lady Smitty Cords, MD 03/05/12 1610

## 2012-03-05 MED ORDER — DOXYCYCLINE HYCLATE 100 MG PO TABS
ORAL_TABLET | ORAL | Status: AC
  Filled 2012-03-05: qty 1

## 2012-03-12 ENCOUNTER — Encounter: Payer: Self-pay | Admitting: Vascular Surgery

## 2012-03-13 ENCOUNTER — Encounter: Payer: Self-pay | Admitting: Vascular Surgery

## 2012-03-17 ENCOUNTER — Ambulatory Visit (INDEPENDENT_AMBULATORY_CARE_PROVIDER_SITE_OTHER): Payer: Medicare Other | Admitting: Vascular Surgery

## 2012-03-17 ENCOUNTER — Encounter: Payer: Self-pay | Admitting: Vascular Surgery

## 2012-03-17 VITALS — BP 138/78 | HR 90 | Resp 16 | Ht 71.0 in | Wt 199.5 lb

## 2012-03-17 DIAGNOSIS — I723 Aneurysm of iliac artery: Secondary | ICD-10-CM

## 2012-03-17 DIAGNOSIS — I728 Aneurysm of other specified arteries: Secondary | ICD-10-CM

## 2012-03-17 NOTE — Progress Notes (Signed)
Subjective:     Patient ID: [name], male   DOB: 01-26-1936, 76 y.o.   MRN: [id number]  HPI this 76 year old male was referred for evaluation of right iliac artery aneurysm discovered on recent CT scan. Dr. Isabel Caprice was evaluating the patient for hematuria. 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 Patient was referred for further evaluation. He has no history of previous aneurysms and no family history of aortic aneurysms. He has had no abdominal or back symptoms. His hematuria has resolved with antibiotics.  Past Medical History  Diagnosis Date  . Asthma   . Bronchitis   . Diabetes mellitus   . Hypertension   . Skull fracture     History  Substance Use Topics  . Smoking status: Current Everyday Smoker    Types: Cigarettes  . Smokeless tobacco: Never Used  . Alcohol Use: 7.0 oz/week    14 drink(s) per week    History reviewed. No pertinent family history.  Allergies  Allergen Reactions  . Prednisone Other (See Comments)    Sweating with high dose  . Penicillins Rash  . Sulfa Antibiotics Rash    Current outpatient prescriptions:amLODipine (NORVASC) 5 MG tablet, Take 5 mg by mouth daily., Disp: , Rfl: ;  Ascorbic Acid (VITAMIN C PO), Take 1 tablet by mouth daily., Disp: , Rfl: ;  calcium carbonate (TUMS - DOSED IN MG ELEMENTAL CALCIUM) 500 MG chewable tablet, Chew 2 tablets by mouth once as needed. For indigestion, Disp: , Rfl: ;  dutasteride (AVODART) 0.5 MG capsule, Take 0.5 mg by mouth daily., Disp: , Rfl:  Fluticasone-Salmeterol (ADVAIR) 250-50 MCG/DOSE AEPB, Inhale 1 puff into the lungs every 12 (twelve) hours., Disp: , Rfl: ;  lisinopril (PRINIVIL,ZESTRIL) 20 MG tablet, Take 20 mg by mouth daily., Disp: , Rfl: ;  Menthol, Topical Analgesic, (ICY HOT) 7.5 % (ROLL) MISC, Apply 1 each topically daily as needed. For muscle tension, Disp: , Rfl: ;   Multiple Vitamin (MULTIVITAMIN WITH MINERALS) TABS, Take 1 tablet by mouth daily., Disp: , Rfl:  PRESCRIPTION MEDICATION, Place 1 drop into both eyes daily as needed. Allergy eye drop, Disp: , Rfl: ;  Tamsulosin HCl (FLOMAX) 0.4 MG CAPS, Take 0.4 mg by mouth daily., Disp: , Rfl: ;  vitamin B-12 (CYANOCOBALAMIN) 1000 MCG tablet, Take 1,000 mcg by mouth daily., Disp: , Rfl: ;  aspirin 81 MG tablet, Take 81 mg by mouth daily., Disp: , Rfl: ;  hydrochlorothiazide (HYDRODIURIL) 12.5 MG tablet, Take 6.25 mg by mouth daily., Disp: , Rfl:   BP 138/78  Pulse 90  Resp 16  Ht 5\' 11"  (1.803 m)  Wt 199 lb 8 oz (90.493 kg)  BMI 27.82 kg/m2  SpO2 98%  Body mass index is 27.82 kg/(m^2).         Review of Systems denies chest pain, dyspnea on exertion, PND, orthopnea, chronic bronchitis, lateralizing weakness, amaurosis fugax, claudication. 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 Other systems negative and complete review of systems other than episodes of hematuria     Objective:   Physical Exam blood pressure 130/78 heart rate 90 respirations 16 Gen.-alert and oriented x3 in no apparent distress HEENT normal for age Lungs no rhonchi or wheezing Cardiovascular regular rhythm no murmurs carotid pulses 3+ palpable no bruits audible Abdomen soft nontender no palpable masses Musculoskeletal free of  major deformities Skin clear -no  rashes Neurologic normal Lower extremities 3+ femoral and dorsalis pedis pulses palpable bilaterally with no edema  Today I reviewed the medical records provided by Dr Shaune Pollack in the CT scan which was performed 03/04/2012. 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 Unlikely this will ever require surgical treatment.

## 2012-03-18 ENCOUNTER — Telehealth: Payer: Self-pay | Admitting: Vascular Surgery

## 2012-03-18 NOTE — Telephone Encounter (Signed)
Followup - pt had 03/17/12 appt with Dr. Hart Rochester - kf

## 2012-03-18 NOTE — Telephone Encounter (Signed)
Message copied by Margaretmary Eddy on Wed Mar 18, 2012 10:35 AM ------      Message from: [place] M      Created: Mon Mar 09, 2012  1:03 PM      Regarding: RE: patient question (referral)      Contact: [phone]       Please let him know that I haven't rec'd anything.                   ----- Message -----         From: Margaretmary Eddy         Sent: 03/09/2012  12:32 PM           To: Allegra Grana      Subject: patient question (referral)                              Pt called. Wanted to verify that we received a referral for him from [facility] in [place] re: CT they did. Please verify with pt. Thank you!

## 2013-03-19 ENCOUNTER — Other Ambulatory Visit: Payer: Self-pay | Admitting: Vascular Surgery

## 2013-03-22 ENCOUNTER — Encounter: Payer: Self-pay | Admitting: Vascular Surgery

## 2013-03-23 ENCOUNTER — Ambulatory Visit
Admission: RE | Admit: 2013-03-23 | Discharge: 2013-03-23 | Disposition: A | Discharge status: Home / Self Care | Payer: Medicare Other | Source: Ambulatory Visit | Attending: Vascular Surgery | Admitting: Vascular Surgery

## 2013-03-23 ENCOUNTER — Encounter: Payer: Self-pay | Admitting: Vascular Surgery

## 2013-03-23 ENCOUNTER — Ambulatory Visit (INDEPENDENT_AMBULATORY_CARE_PROVIDER_SITE_OTHER): Payer: Medicare Other | Admitting: Vascular Surgery

## 2013-03-23 VITALS — BP 122/70 | HR 83 | Resp 16 | Ht 71.0 in | Wt 195.0 lb

## 2013-03-23 DIAGNOSIS — I723 Aneurysm of iliac artery: Secondary | ICD-10-CM

## 2013-03-23 DIAGNOSIS — I728 Aneurysm of other specified arteries: Secondary | ICD-10-CM

## 2013-03-23 MED ORDER — IOHEXOL 350 MG/ML SOLN
100.0000 mL | Freq: Once | INTRAVENOUS | Status: AC | PRN
  Administered 2013-03-23: 100 mL via INTRAVENOUS

## 2013-03-23 NOTE — Progress Notes (Signed)
Subjective:     Patient ID: [name], male   DOB: 03-23-1936, 77 y.o.   MRN: [id number]  HPI this 77 year old male is seen in continued followup regarding a small right common iliac artery aneurysm discovered one year ago. He has had no abdominal or back symptoms. He also had some mild dilatation of the celiac axis.  Past Medical History  Diagnosis Date  . Asthma   . Bronchitis   . Diabetes mellitus   . Hypertension   . Skull fracture   . Iliac artery aneurysm     History  Substance Use Topics  . Smoking status: Current Every Day Smoker    Types: Cigarettes  . Smokeless tobacco: Never Used  . Alcohol Use: 7.0 oz/week    14 drink(s) per week    Family History  Problem Relation Age of Onset  . Stroke Father     Allergies  Allergen Reactions  . Prednisone Other (See Comments)    Sweating with high dose  . Penicillins Rash  . Sulfa Antibiotics Rash    Current outpatient prescriptions:amLODipine (NORVASC) 5 MG tablet, Take 5 mg by mouth daily., Disp: , Rfl: ;  Ascorbic Acid (VITAMIN C PO), Take 1 tablet by mouth daily., Disp: , Rfl: ;  aspirin 81 MG tablet, Take 81 mg by mouth daily., Disp: , Rfl: ;  atorvastatin (LIPITOR) 10 MG tablet, Take 10 mg by mouth daily., Disp: , Rfl:  calcium carbonate (TUMS - DOSED IN MG ELEMENTAL CALCIUM) 500 MG chewable tablet, Chew 2 tablets by mouth once as needed. For indigestion, Disp: , Rfl: ;  dutasteride (AVODART) 0.5 MG capsule, Take 0.5 mg by mouth daily., Disp: , Rfl: ;  Fluticasone-Salmeterol (ADVAIR) 250-50 MCG/DOSE AEPB, Inhale 1 puff into the lungs every 12 (twelve) hours., Disp: , Rfl: ;  lisinopril (PRINIVIL,ZESTRIL) 20 MG tablet, Take 20 mg by mouth daily., Disp: , Rfl:  Menthol, Topical Analgesic, (ICY HOT) 7.5 % (ROLL) MISC, Apply 1 each topically daily as needed. For muscle tension, Disp: , Rfl: ;  Multiple Vitamin (MULTIVITAMIN WITH MINERALS) TABS, Take 1 tablet by mouth daily., Disp: , Rfl: ;  PRESCRIPTION MEDICATION, Place 1  drop into both eyes daily as needed. Allergy eye drop, Disp: , Rfl: ;  Tamsulosin HCl (FLOMAX) 0.4 MG CAPS, Take 0.4 mg by mouth daily., Disp: , Rfl:  vitamin B-12 (CYANOCOBALAMIN) 1000 MCG tablet, Take 1,000 mcg by mouth daily., Disp: , Rfl: ;  hydrochlorothiazide (HYDRODIURIL) 12.5 MG tablet, Take 6.25 mg by mouth daily., Disp: , Rfl:   BP 122/70  Pulse 83  Resp 16  Ht 5\' 11"  (1.803 m)  Wt 195 lb (88.451 kg)  BMI 27.21 kg/m2  Body mass index is 27.21 kg/(m^2).        \   Review of Systems complains of asthma, occasional hematuria but denies chest pain, dyspnea on exertion, PND, orthopnea, hemoptysis, claudication. All of systems negative and a complete review of systems     Objective:   Physical Exam BP 122/70  Pulse 83  Resp 16  Ht 5\' 11"  (1.803 m)  Wt 195 lb (88.451 kg)  BMI 27.21 kg/m2  Gen.-alert and oriented x3 in no apparent distress HEENT normal for age Lungs no rhonchi or wheezing Cardiovascular regular rhythm no murmurs carotid pulses 3+ palpable no bruits audible Abdomen soft nontender no palpable masses Musculoskeletal free of  major deformities Skin clear -no rashes Neurologic normal Lower extremities 3+ femoral and dorsalis pedis pulses palpable bilaterally with no edema  Data ordered a CT scan with contrast which are reviewed by computer. The right common iliac artery aneurysm is essentially unchanged with approximate 26 mm diameter. The celiac axis has mild dilatation about 15 mm in size.     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

## 2013-03-25 ENCOUNTER — Other Ambulatory Visit: Payer: Self-pay | Admitting: *Deleted

## 2013-03-25 DIAGNOSIS — I723 Aneurysm of iliac artery: Secondary | ICD-10-CM

## 2013-05-05 ENCOUNTER — Other Ambulatory Visit: Payer: Self-pay | Admitting: *Deleted

## 2013-05-05 DIAGNOSIS — I728 Aneurysm of other specified arteries: Secondary | ICD-10-CM

## 2013-05-05 DIAGNOSIS — I723 Aneurysm of iliac artery: Secondary | ICD-10-CM

## 2013-06-21 ENCOUNTER — Ambulatory Visit
Admission: RE | Admit: 2013-06-21 | Discharge: 2013-06-21 | Disposition: A | Discharge status: Home / Self Care | Payer: Medicare Other | Source: Ambulatory Visit | Attending: Family Medicine | Admitting: Family Medicine

## 2013-06-21 ENCOUNTER — Other Ambulatory Visit: Payer: Self-pay | Admitting: Family Medicine

## 2013-06-21 DIAGNOSIS — R0602 Shortness of breath: Secondary | ICD-10-CM

## 2013-07-30 ENCOUNTER — Other Ambulatory Visit: Payer: Self-pay | Admitting: Family Medicine

## 2013-07-30 DIAGNOSIS — J45909 Unspecified asthma, uncomplicated: Secondary | ICD-10-CM

## 2013-09-10 ENCOUNTER — Encounter: Payer: Self-pay | Admitting: *Deleted

## 2013-09-15 ENCOUNTER — Encounter: Payer: Self-pay | Admitting: Neurology

## 2013-09-15 ENCOUNTER — Ambulatory Visit (INDEPENDENT_AMBULATORY_CARE_PROVIDER_SITE_OTHER): Payer: Medicare Other | Admitting: Neurology

## 2013-09-15 VITALS — BP 123/73 | HR 91 | Resp 18 | Ht 71.0 in | Wt 190.0 lb

## 2013-09-15 DIAGNOSIS — R269 Unspecified abnormalities of gait and mobility: Secondary | ICD-10-CM

## 2013-09-15 DIAGNOSIS — G44229 Chronic tension-type headache, not intractable: Secondary | ICD-10-CM

## 2013-09-15 HISTORY — DX: Unspecified abnormalities of gait and mobility: R26.9

## 2013-09-15 MED ORDER — AMITRIPTYLINE HCL 10 MG PO TABS: 10.0000 mg | ORAL_TABLET | Freq: Every day | ORAL | Status: DC

## 2013-09-15 NOTE — Progress Notes (Signed)
[facility]  Provider:  Larey Seat, M D  Referring Provider: Marjorie Smolder, MD Primary Care Physician:  Marjorie Smolder, MD  Chief Complaint  Patient presents with  . New Evaluation    Room 11  . Gait Problem    HPI:  [name] is a 78 y.o. male  Is seen here as a referral   from Dr. Inda Merlin for evaluation of Tension headaches, and progressive gait disorder, walking wide based.   The patient reports a long neurologic history: While still living in [place] 1959 through 1 he had some evaluation for tightness at the ankles and dizziness . He went to the [place], lived in [place] . He had EEG evaluations for ? These were normal. He had again seen neurology for a fall related skull fracture , went for 6 days to ICU under Dr Clarice Pole care in 2009 , diagnosed with a subdural hematoma. He walks with a cane since. He lost left sided hearing , and vertigo -but the fracture was on the right high parietal skull. He likes to sleep on this right. Has hip pain, he reads and works on his computer, noticed that the seated posture provokes the tightness in the neck, he gets dizzy with looking to th right.  He has seen Dr. Melton Alar for tension neck and headaches. He is now retired, daily attending church and he has noted that he develops his tightness once he has been up and about for several minutes.          Review of Systems: Out of a complete 14 system review, the patient complains of only the following symptoms, and all other reviewed systems are negative. Vertigo- dizziness.   History   Social History  . Marital Status: Married    Spouse Name: [name]    Number of Children: 3  . Years of Education: 13   Occupational History  . Not on file.   Social History Main Topics  . Smoking status: Current Every Day Smoker    Types: Cigarettes  . Smokeless tobacco: Never Used  . Alcohol Use: 7.0 oz/week    14 drink(s) per week     Comment: 1-2 before dinner  .  Drug Use: No  . Sexual Activity: Not on file   Other Topics Concern  . Not on file   Social History Narrative   Patient is married [name]).   Patient has three children and 7 grandchildren.   Patient is retired.   Patient drinks one serving of coffee daily.   Patient is right-handed.   Patient has a college education.    Family History  Problem Relation Age of Onset  . Stroke Father     Past Medical History  Diagnosis Date  . Asthma   . Bronchitis   . Diabetes mellitus   . Hypertension   . Skull fracture   . Iliac artery aneurysm   . Peripheral neuropathy   . Hypercholesterolemia   . Subdural hematoma   . Progressive gait disorder   . Progressive gait disorder 09/15/2013    History reviewed. No pertinent past surgical history.  Current Outpatient Prescriptions  Medication Sig Dispense Refill  . amLODipine (NORVASC) 5 MG tablet Take 5 mg by mouth daily.      . Ascorbic Acid (VITAMIN C PO) Take 1 tablet by mouth daily.      Marland Kitchen aspirin 81 MG tablet Take 81 mg by mouth daily.      Marland Kitchen atorvastatin (LIPITOR)  10 MG tablet Take 10 mg by mouth daily.      . Blood Glucose Monitoring Suppl (ONE TOUCH ULTRA 2) W/DEVICE KIT       . calcium carbonate (TUMS - DOSED IN MG ELEMENTAL CALCIUM) 500 MG chewable tablet Chew 2 tablets by mouth once as needed. For indigestion      . dutasteride (AVODART) 0.5 MG capsule Take 0.5 mg by mouth daily.      . Fluticasone-Salmeterol (ADVAIR) 250-50 MCG/DOSE AEPB Inhale 1 puff into the lungs every 12 (twelve) hours.      . hydrochlorothiazide (HYDRODIURIL) 12.5 MG tablet Take 6.25 mg by mouth daily.      Marland Kitchen lisinopril (PRINIVIL,ZESTRIL) 20 MG tablet Take 20 mg by mouth daily.      . Menthol, Topical Analgesic, (ICY HOT) 7.5 % (ROLL) MISC Apply 1 each topically daily as needed. For muscle tension      . metFORMIN (GLUCOPHAGE) 500 MG tablet       . MICROLET LANCETS MISC       . Multiple Vitamin (MULTIVITAMIN WITH MINERALS) TABS Take 1 tablet by mouth  daily.      . ONE TOUCH ULTRA TEST test strip       . PRESCRIPTION MEDICATION Place 1 drop into both eyes daily as needed. Allergy eye drop      . Tamsulosin HCl (FLOMAX) 0.4 MG CAPS Take 0.4 mg by mouth daily.      . vitamin B-12 (CYANOCOBALAMIN) 1000 MCG tablet Take 1,000 mcg by mouth daily.       No current facility-administered medications for this visit.    Allergies as of 09/15/2013 - Review Complete 09/15/2013  Allergen Reaction Noted  . Pneumovax 23 [pneumococcal vac polyvalent]  09/10/2013  . Prednisone Other (See Comments) 08/03/2011  . Penicillins Rash 08/03/2011  . Sulfa antibiotics Rash 08/03/2011    Vitals: BP 123/73  Pulse 91  Resp 18  Ht 5' 11"  (1.803 m)  Wt 190 lb (86.183 kg)  BMI 26.51 kg/m2 Last Weight:  Wt Readings from Last 1 Encounters:  09/15/13 190 lb (86.183 kg)   Last Height:   Ht Readings from Last 1 Encounters:  09/15/13 5' 11"  (1.803 m)   Physical exam:  General: The patient is awake, alert and appears not in acute distress. The patient is well groomed. Head: Normocephalic, atraumatic. Neck is supple. Mallampati 3, neck circumference: 18 inches. Cardiovascular:  Regular rate and rhythm , without  murmurs or carotid bruit, and without distended neck veins. Respiratory: Lungs are clear to auscultation. Skin:  Without evidence of edema, or rash Trunk: BMI is elevated. Patient  has normal posture.  Neurologic exam : The patient is awake and alert, oriented to place and time.  Memory subjective  described as intact. There is a normal attention span & concentration ability. Speech is fluent without  dysarthria, dysphonia or aphasia. Mood and affect are appropriate.  Cranial nerves: Pupils are equal and briskly reactive to light. Funduscopic exam without  evidence of pallor or edema.  Extraocular movements  in vertical and horizontal planes intact and without nystagmus. Visual fields by finger perimetry are intact. Hearing to finger rub intact.   Facial sensation intact to fine touch. Facial motor strength is symmetric and tongue and uvula move midline.  Motor exam:  Normal tone and normal muscle bulk and symmetric normal strength in all extremities.  Sensory:  Fine touch, pinprick and vibration were tested in all extremities. Loss of vibratory sensation is noted from ankle  downwards bilateral.   Coordination: Rapid alternating movements in the fingers/hands is tested and normal.  Finger-to-nose maneuver tested and normal without evidence of ataxia, dysmetria or tremor.  Gait and station: Patient walks without assistive device. Strength within normal limits. He walks with long strides. Stance is stable and normal. Tandem gait is unfragmented.  Romberg testing is normal.  Deep tendon reflexes: in the upper and lower extremities are symmetric, downgoing.   Assessment:  After physical and neurologic examination, review of laboratory studies, imaging, neurophysiology testing and pre-existing records, assessment is;  1) diabetic neuropathy 2) vestibular dysfunction, residual since skull fracture.  3) multifactorial gait disorder. Tension neck pain .  Plan:  Treatment plan and additional workup : 1) Elavil at night lowest dose.  2) vestibular evaluation with Pt next door.    Arvilla Salada, MD

## 2013-09-22 ENCOUNTER — Ambulatory Visit
Admission: RE | Admit: 2013-09-22 | Discharge: 2013-09-22 | Disposition: A | Discharge status: Home / Self Care | Payer: Medicare Other | Source: Ambulatory Visit | Attending: Neurology | Admitting: Neurology

## 2013-09-22 DIAGNOSIS — G44229 Chronic tension-type headache, not intractable: Secondary | ICD-10-CM

## 2013-09-22 DIAGNOSIS — R269 Unspecified abnormalities of gait and mobility: Secondary | ICD-10-CM

## 2013-09-27 ENCOUNTER — Telehealth: Payer: Self-pay | Admitting: Neurology

## 2013-09-27 NOTE — Telephone Encounter (Signed)
Pt called and asked for the results for the MRI he had last week.  He understands that Dr. Brett Fairy is out of the office, however would like the WID to call. Please call.  Thank you.

## 2013-09-27 NOTE — Telephone Encounter (Signed)
Discussed results with patient. He would like a follow up with Dr Brett Fairy to discuss further.

## 2013-09-27 NOTE — Telephone Encounter (Signed)
Patient is calling requesting MRI results. Dr. Brett Fairy patient, sending to [facility], Dr. Janann Colonel. Please advise

## 2013-09-30 NOTE — Telephone Encounter (Signed)
Called pt and pt stated that Dr. Janann Colonel had called him to discuss his MRI results and that Dr. Janann Colonel was really nice. I informed the pt that he has an f/u appt with Dr. Brett Fairy on 03/03/14. I advised the pt that if he has any other problems, questions or concerns to call the office. Pt verbalized understanding.

## 2013-10-06 ENCOUNTER — Ambulatory Visit: Payer: Medicare Other | Admitting: Neurology

## 2013-10-18 ENCOUNTER — Ambulatory Visit: Payer: Medicare Other | Attending: Neurology

## 2013-10-18 DIAGNOSIS — R293 Abnormal posture: Secondary | ICD-10-CM | POA: Insufficient documentation

## 2013-10-18 DIAGNOSIS — IMO0001 Reserved for inherently not codable concepts without codable children: Secondary | ICD-10-CM | POA: Insufficient documentation

## 2013-10-18 DIAGNOSIS — G44229 Chronic tension-type headache, not intractable: Secondary | ICD-10-CM | POA: Insufficient documentation

## 2013-10-18 DIAGNOSIS — R269 Unspecified abnormalities of gait and mobility: Secondary | ICD-10-CM | POA: Insufficient documentation

## 2013-10-19 ENCOUNTER — Ambulatory Visit: Payer: Medicare Other

## 2013-10-28 ENCOUNTER — Ambulatory Visit: Payer: Medicare Other

## 2013-11-09 ENCOUNTER — Ambulatory Visit: Payer: Medicare Other

## 2013-11-15 NOTE — Progress Notes (Signed)
Quick Note:  Dr. Janann Colonel has already spoke to patient about MRI results. ______

## 2013-11-16 ENCOUNTER — Ambulatory Visit: Payer: Medicare Other | Attending: Neurology

## 2013-11-16 DIAGNOSIS — I728 Aneurysm of other specified arteries: Secondary | ICD-10-CM | POA: Insufficient documentation

## 2013-11-16 DIAGNOSIS — G44229 Chronic tension-type headache, not intractable: Secondary | ICD-10-CM | POA: Insufficient documentation

## 2013-11-16 DIAGNOSIS — Z5189 Encounter for other specified aftercare: Secondary | ICD-10-CM | POA: Diagnosis not present

## 2013-11-16 DIAGNOSIS — R269 Unspecified abnormalities of gait and mobility: Secondary | ICD-10-CM | POA: Insufficient documentation

## 2013-11-16 DIAGNOSIS — I723 Aneurysm of iliac artery: Secondary | ICD-10-CM | POA: Diagnosis not present

## 2013-11-30 ENCOUNTER — Ambulatory Visit (INDEPENDENT_AMBULATORY_CARE_PROVIDER_SITE_OTHER): Payer: Medicare Other | Admitting: Internal Medicine

## 2013-11-30 DIAGNOSIS — J45909 Unspecified asthma, uncomplicated: Secondary | ICD-10-CM

## 2013-11-30 NOTE — Progress Notes (Signed)
PFT done today. 

## 2013-12-01 LAB — PULMONARY FUNCTION TEST
DL/VA % PRED: 78 %
DL/VA: 3.62 ml/min/mmHg/L
DLCO unc % pred: 78 %
DLCO unc: 25.25 ml/min/mmHg
FEF 25-75 POST: 0.86 L/s
FEF 25-75 PRE: 0.54 L/s
FEF2575-%CHANGE-POST: 59 %
FEF2575-%PRED-POST: 39 %
FEF2575-%PRED-PRE: 24 %
FEV1-%Change-Post: 4 %
FEV1-%PRED-PRE: 55 %
FEV1-%Pred-Post: 57 %
FEV1-POST: 1.76 L
FEV1-PRE: 1.68 L
FEV1FVC-%CHANGE-POST: 0 %
FEV1FVC-%PRED-PRE: 81 %
FEV6-%CHANGE-POST: 8 %
FEV6-%PRED-POST: 74 %
FEV6-%Pred-Pre: 68 %
FEV6-PRE: 2.72 L
FEV6-Post: 2.96 L
FEV6FVC-%Change-Post: 1 %
FEV6FVC-%Pred-Post: 104 %
FEV6FVC-%Pred-Pre: 102 %
FVC-%Change-Post: 5 %
FVC-%PRED-POST: 71 %
FVC-%Pred-Pre: 68 %
FVC-Post: 3.03 L
FVC-Pre: 2.87 L
POST FEV1/FVC RATIO: 58 %
POST FEV6/FVC RATIO: 98 %
Pre FEV1/FVC ratio: 59 %
Pre FEV6/FVC Ratio: 97 %
RV % pred: 178 %
RV: 4.58 L
TLC % PRED: 109 %
TLC: 7.72 L

## 2014-02-12 ENCOUNTER — Emergency Department (HOSPITAL_BASED_OUTPATIENT_CLINIC_OR_DEPARTMENT_OTHER)
Admission: EM | Admit: 2014-02-12 | Discharge: 2014-02-12 | Disposition: A | Discharge status: Home / Self Care | Payer: Medicare Other | Attending: Emergency Medicine | Admitting: Emergency Medicine

## 2014-02-12 ENCOUNTER — Encounter (HOSPITAL_BASED_OUTPATIENT_CLINIC_OR_DEPARTMENT_OTHER): Payer: Self-pay | Admitting: Emergency Medicine

## 2014-02-12 DIAGNOSIS — R5381 Other malaise: Secondary | ICD-10-CM | POA: Insufficient documentation

## 2014-02-12 DIAGNOSIS — I10 Essential (primary) hypertension: Secondary | ICD-10-CM | POA: Diagnosis not present

## 2014-02-12 DIAGNOSIS — Z79899 Other long term (current) drug therapy: Secondary | ICD-10-CM | POA: Diagnosis not present

## 2014-02-12 DIAGNOSIS — J45909 Unspecified asthma, uncomplicated: Secondary | ICD-10-CM | POA: Insufficient documentation

## 2014-02-12 DIAGNOSIS — E119 Type 2 diabetes mellitus without complications: Secondary | ICD-10-CM | POA: Insufficient documentation

## 2014-02-12 DIAGNOSIS — Z8781 Personal history of (healed) traumatic fracture: Secondary | ICD-10-CM | POA: Diagnosis not present

## 2014-02-12 DIAGNOSIS — R42 Dizziness and giddiness: Secondary | ICD-10-CM | POA: Insufficient documentation

## 2014-02-12 DIAGNOSIS — IMO0002 Reserved for concepts with insufficient information to code with codable children: Secondary | ICD-10-CM | POA: Insufficient documentation

## 2014-02-12 DIAGNOSIS — F17200 Nicotine dependence, unspecified, uncomplicated: Secondary | ICD-10-CM | POA: Diagnosis not present

## 2014-02-12 DIAGNOSIS — Z880 Allergy status to penicillin: Secondary | ICD-10-CM | POA: Diagnosis not present

## 2014-02-12 DIAGNOSIS — R5383 Other fatigue: Secondary | ICD-10-CM

## 2014-02-12 DIAGNOSIS — Z7982 Long term (current) use of aspirin: Secondary | ICD-10-CM | POA: Insufficient documentation

## 2014-02-12 LAB — COMPREHENSIVE METABOLIC PANEL
ALBUMIN: 4.6 g/dL (ref 3.5–5.2)
ALK PHOS: 56 U/L (ref 39–117)
ALT: 17 U/L (ref 0–53)
AST: 17 U/L (ref 0–37)
Anion gap: 14 (ref 5–15)
BILIRUBIN TOTAL: 0.4 mg/dL (ref 0.3–1.2)
BUN: 17 mg/dL (ref 6–23)
CHLORIDE: 94 meq/L — AB (ref 96–112)
CO2: 25 mEq/L (ref 19–32)
Calcium: 10.4 mg/dL (ref 8.4–10.5)
Creatinine, Ser: 0.5 mg/dL (ref 0.50–1.35)
GFR calc non Af Amer: >90 mL/min (ref >90)
GLUCOSE: 121 mg/dL — AB (ref 70–99)
POTASSIUM: 4.5 meq/L (ref 3.7–5.3)
SODIUM: 133 meq/L — AB (ref 137–147)
TOTAL PROTEIN: 8 g/dL (ref 6.0–8.3)

## 2014-02-12 LAB — CBC WITH DIFFERENTIAL/PLATELET
Basophils Absolute: 0.1 10*3/uL (ref 0.0–0.1)
Basophils Relative: 1 % (ref 0–1)
EOS ABS: 0.2 10*3/uL (ref 0.0–0.7)
Eosinophils Relative: 4 % (ref 0–5)
HCT: 39.2 % (ref 39.0–52.0)
HEMOGLOBIN: 14.1 g/dL (ref 13.0–17.0)
LYMPHS ABS: 1.5 10*3/uL (ref 0.7–4.0)
Lymphocytes Relative: 31 % (ref 12–46)
MCH: 34.4 pg — AB (ref 26.0–34.0)
MCHC: 36 g/dL (ref 30.0–36.0)
MCV: 95.6 fL (ref 78.0–100.0)
MONOS PCT: 12 % (ref 3–12)
Monocytes Absolute: 0.6 10*3/uL (ref 0.1–1.0)
NEUTROS ABS: 2.6 10*3/uL (ref 1.7–7.7)
NEUTROS PCT: 53 % (ref 43–77)
PLATELETS: 196 10*3/uL (ref 150–400)
RBC: 4.1 MIL/uL — AB (ref 4.22–5.81)
RDW: 12.1 % (ref 11.5–15.5)
WBC: 5 10*3/uL (ref 4.0–10.5)

## 2014-02-12 LAB — URINALYSIS, ROUTINE W REFLEX MICROSCOPIC
BILIRUBIN URINE: NEGATIVE
Glucose, UA: 100 mg/dL — AB
HGB URINE DIPSTICK: NEGATIVE
KETONES UR: NEGATIVE mg/dL
Leukocytes, UA: NEGATIVE
Nitrite: NEGATIVE
PH: 6.5 (ref 5.0–8.0)
Protein, ur: NEGATIVE mg/dL
SPECIFIC GRAVITY, URINE: 1.014 (ref 1.005–1.030)
Urobilinogen, UA: 0.2 mg/dL (ref 0.0–1.0)

## 2014-02-12 LAB — CBG MONITORING, ED: GLUCOSE-CAPILLARY: 126 mg/dL — AB (ref 70–99)

## 2014-02-12 LAB — TROPONIN I: Troponin I: <0.3 ng/mL (ref <0.30)

## 2014-02-12 MED ORDER — SODIUM CHLORIDE 0.9 % IV BOLUS (SEPSIS)
500.0000 mL | Freq: Once | INTRAVENOUS | Status: AC
  Administered 2014-02-12: 500 mL via INTRAVENOUS

## 2014-02-12 NOTE — ED Notes (Signed)
Patient here with ongoing intermittent dizziness for the past week. Reports 1 episode of room spinning and feels more dizzy when he bends over, tried meclizine with minimal relief. Alert and oriented. Has had MRI for same and no diagnosis

## 2014-02-12 NOTE — ED Notes (Signed)
Pt took two of his own motrin that he brought from home. Ok'ed per Dr. Tamera Punt.

## 2014-02-12 NOTE — Discharge Instructions (Signed)
Dizziness °Dizziness is a common problem. It is a feeling of unsteadiness or light-headedness. You may feel like you are about to faint. Dizziness can lead to injury if you stumble or fall. A person of any age group can suffer from dizziness, but dizziness is more common in older adults. °CAUSES  °Dizziness can be caused by many different things, including: °· Middle ear problems. °· Standing for too long. °· Infections. °· An allergic reaction. °· Aging. °· An emotional response to something, such as the sight of blood. °· Side effects of medicines. °· Tiredness. °· Problems with circulation or blood pressure. °· Excessive use of alcohol or medicines, or illegal drug use. °· Breathing too fast (hyperventilation). °· An irregular heart rhythm (arrhythmia). °· A low red blood cell count (anemia). °· Pregnancy. °· Vomiting, diarrhea, fever, or other illnesses that cause body fluid loss (dehydration). °· Diseases or conditions such as Parkinson's disease, high blood pressure (hypertension), diabetes, and thyroid problems. °· Exposure to extreme heat. °DIAGNOSIS  °Your health care provider will ask about your symptoms, perform a physical exam, and perform an electrocardiogram (ECG) to record the electrical activity of your heart. Your health care provider may also perform other heart or blood tests to determine the cause of your dizziness. These may include: °· Transthoracic echocardiogram (TTE). During echocardiography, sound waves are used to evaluate how blood flows through your heart. °· Transesophageal echocardiogram (TEE). °· Cardiac monitoring. This allows your health care provider to monitor your heart rate and rhythm in real time. °· Holter monitor. This is a portable device that records your heartbeat and can help diagnose heart arrhythmias. It allows your health care provider to track your heart activity for several days if needed. °· Stress tests by exercise or by giving medicine that makes the heart beat  faster. °TREATMENT  °Treatment of dizziness depends on the cause of your symptoms and can vary greatly. °HOME CARE INSTRUCTIONS  °· Drink enough fluids to keep your urine clear or pale yellow. This is especially important in very hot weather. In older adults, it is also important in cold weather. °· Take your medicine exactly as directed if your dizziness is caused by medicines. When taking blood pressure medicines, it is especially important to get up slowly. °¨ Rise slowly from chairs and steady yourself until you feel okay. °¨ In the morning, first sit up on the side of the bed. When you feel okay, stand slowly while holding onto something until you know your balance is fine. °· Move your legs often if you need to stand in one place for a long time. Tighten and relax your muscles in your legs while standing. °· Have someone stay with you for 1-2 days if dizziness continues to be a problem. Do this until you feel you are well enough to stay alone. Have the person call your health care provider if he or she notices changes in you that are concerning. °· Do not drive or use heavy machinery if you feel dizzy. °· Do not drink alcohol. °SEEK IMMEDIATE MEDICAL CARE IF:  °· Your dizziness or light-headedness gets worse. °· You feel nauseous or vomit. °· You have problems talking, walking, or using your arms, hands, or legs. °· You feel weak. °· You are not thinking clearly or you have trouble forming sentences. It may take a friend or family member to notice this. °· You have chest pain, abdominal pain, shortness of breath, or sweating. °· Your vision changes. °· You notice   any bleeding. °· You have side effects from medicine that seems to be getting worse rather than better. °MAKE SURE YOU:  °· Understand these instructions. °· Will watch your condition. °· Will get help right away if you are not doing well or get worse. °Document Released: 12/25/2000 Document Revised: 07/06/2013 Document Reviewed: 01/18/2011 °[employer]®  Patient Information ©2015 [employer]. This information is not intended to replace advice given to you by your health care provider. Make sure you discuss any questions you have with your health care provider. ° ° °Dehydration, Adult °Dehydration is when you lose more fluids from the body than you take in. Vital organs like the kidneys, brain, and heart cannot function without a proper amount of fluids and salt. Any loss of fluids from the body can cause dehydration.  °CAUSES  °· Vomiting. °· Diarrhea. °· Excessive sweating. °· Excessive urine output. °· Fever. °SYMPTOMS  °Mild dehydration °· Thirst. °· Dry lips. °· Slightly dry mouth. °Moderate dehydration °· Very dry mouth. °· Sunken eyes. °· Skin does not bounce back quickly when lightly pinched and released. °· Dark urine and decreased urine production. °· Decreased tear production. °· Headache. °Severe dehydration °· Very dry mouth. °· Extreme thirst. °· Rapid, weak pulse (more than 100 beats per minute at rest). °· Cold hands and feet. °· Not able to sweat in spite of heat and temperature. °· Rapid breathing. °· Blue lips. °· Confusion and lethargy. °· Difficulty being awakened. °· Minimal urine production. °· No tears. °DIAGNOSIS  °Your caregiver will diagnose dehydration based on your symptoms and your exam. Blood and urine tests will help confirm the diagnosis. The diagnostic evaluation should also identify the cause of dehydration. °TREATMENT  °Treatment of mild or moderate dehydration can often be done at home by increasing the amount of fluids that you drink. It is best to drink small amounts of fluid more often. Drinking too much at one time can make vomiting worse. Refer to the home care instructions below. °Severe dehydration needs to be treated at the hospital where you will probably be given intravenous (IV) fluids that contain water and electrolytes. °HOME CARE INSTRUCTIONS  °· Ask your caregiver about specific rehydration instructions. °· Drink  enough fluids to keep your urine clear or pale yellow. °· Drink small amounts frequently if you have nausea and vomiting. °· Eat as you normally do. °· Avoid: °¨ Foods or drinks high in sugar. °¨ Carbonated drinks. °¨ Juice. °¨ Extremely hot or cold fluids. °¨ Drinks with caffeine. °¨ Fatty, greasy foods. °¨ Alcohol. °¨ Tobacco. °¨ Overeating. °¨ Gelatin desserts. °· Wash your hands well to avoid spreading bacteria and viruses. °· Only take over-the-counter or prescription medicines for pain, discomfort, or fever as directed by your caregiver. °· Ask your caregiver if you should continue all prescribed and over-the-counter medicines. °· Keep all follow-up appointments with your caregiver. °SEEK MEDICAL CARE IF: °· You have abdominal pain and it increases or stays in one area (localizes). °· You have a rash, stiff neck, or severe headache. °· You are irritable, sleepy, or difficult to awaken. °· You are weak, dizzy, or extremely thirsty. °SEEK IMMEDIATE MEDICAL CARE IF:  °· You are unable to keep fluids down or you get worse despite treatment. °· You have frequent episodes of vomiting or diarrhea. °· You have blood or green matter (bile) in your vomit. °· You have blood in your stool or your stool looks black and tarry. °· You have not urinated in 6 to   8 hours, or you have only urinated a small amount of very dark urine. °· You have a fever. °· You faint. °MAKE SURE YOU:  °· Understand these instructions. °· Will watch your condition. °· Will get help right away if you are not doing well or get worse. °Document Released: 07/01/2005 Document Revised: 09/23/2011 Document Reviewed: 02/18/2011 °ExitCare® Patient Information ©2015 [employer]. This information is not intended to replace advice given to you by your health care provider. Make sure you discuss any questions you have with your health care provider. ° °

## 2014-02-12 NOTE — ED Provider Notes (Signed)
CSN: [id number]     Arrival date & time 02/12/14  1125 History   First MD Initiated Contact with Patient 02/12/14 1235     Chief Complaint  Patient presents with  . Dizziness     (Consider location/radiation/quality/duration/timing/severity/associated sxs/prior Treatment) Patient is a 78 y.o. male presenting with dizziness. The history is provided by the patient. No language interpreter was used.  Dizziness Quality:  Lightheadedness and room spinning Associated symptoms: no chest pain, no headaches, no nausea, no shortness of breath and no vomiting   Associated symptoms comment:  Last week he started having symptoms he feels related to previous episodes of vertigo. He took his Meclizine and reports that has improved over the last week. For the past 2-3 days, he reports excessive weakness and fatigue along with some mild residual dizziness. No headache pain, no nausea, fever, SOB, chest pain or cough. He has not fainted or fallen. He states the weakness feels like his "legs and brain are not connected" and he feels "wobley".   Past Medical History  Diagnosis Date  . Asthma   . Bronchitis   . Diabetes mellitus   . Hypertension   . Skull fracture   . Iliac artery aneurysm   . Peripheral neuropathy   . Hypercholesterolemia   . Subdural hematoma   . Progressive gait disorder   . Progressive gait disorder 09/15/2013   History reviewed. No pertinent past surgical history. Family History  Problem Relation Age of Onset  . Stroke Father    History  Substance Use Topics  . Smoking status: Current Every Day Smoker    Types: Cigarettes  . Smokeless tobacco: Never Used  . Alcohol Use: 7.0 oz/week    14 drink(s) per week     Comment: 1-2 before dinner    Review of Systems  Constitutional: Negative for fever and chills.  HENT: Negative for congestion.   Respiratory: Negative.  Negative for cough and shortness of breath.   Cardiovascular: Negative.  Negative for chest pain and leg  swelling.  Gastrointestinal: Negative.  Negative for nausea, vomiting and abdominal pain.  Genitourinary: Negative.  Negative for dysuria.  Musculoskeletal: Negative.  Negative for myalgias.  Skin: Negative.  Negative for pallor.  Neurological: Positive for dizziness, weakness and light-headedness. Negative for syncope, facial asymmetry, speech difficulty and headaches.  Psychiatric/Behavioral: Negative.  Negative for confusion.      Allergies  Pneumovax 23; Prednisone; Penicillins; and Sulfa antibiotics  Home Medications   Prior to Admission medications   Medication Sig Start Date End Date Taking? Authorizing Provider  amitriptyline (ELAVIL) 10 MG tablet Take 1 tablet (10 mg total) by mouth at bedtime. Take one at bedtime for 14 days , if limited effect on tension neck pain, increase to 2 at night. 09/15/13   Asencion Partridge Dohmeier, MD  amLODipine (NORVASC) 5 MG tablet Take 5 mg by mouth daily.    Historical Provider, MD  Ascorbic Acid (VITAMIN C PO) Take 1 tablet by mouth daily.    Historical Provider, MD  aspirin 81 MG tablet Take 81 mg by mouth daily.    Historical Provider, MD  atorvastatin (LIPITOR) 10 MG tablet Take 10 mg by mouth daily.    Historical Provider, MD  Blood Glucose Monitoring Suppl (ONE TOUCH ULTRA 2) W/DEVICE KIT  09/01/13   Historical Provider, MD  calcium carbonate (TUMS - DOSED IN MG ELEMENTAL CALCIUM) 500 MG chewable tablet Chew 2 tablets by mouth once as needed. For indigestion    Historical Provider, MD  dutasteride (AVODART) 0.5 MG capsule Take 0.5 mg by mouth daily.    Historical Provider, MD  Fluticasone-Salmeterol (ADVAIR) 250-50 MCG/DOSE AEPB Inhale 1 puff into the lungs every 12 (twelve) hours.    Historical Provider, MD  hydrochlorothiazide (HYDRODIURIL) 12.5 MG tablet Take 6.25 mg by mouth daily.    Historical Provider, MD  lisinopril (PRINIVIL,ZESTRIL) 20 MG tablet Take 20 mg by mouth daily.    Historical Provider, MD  Menthol, Topical Analgesic, (ICY HOT) 7.5  % (ROLL) MISC Apply 1 each topically daily as needed. For muscle tension    Historical Provider, MD  metFORMIN (GLUCOPHAGE) 500 MG tablet  08/27/13   Historical Provider, MD  MICROLET LANCETS [place]  08/27/13   Historical Provider, MD  Multiple Vitamin (MULTIVITAMIN WITH MINERALS) TABS Take 1 tablet by mouth daily.    Historical Provider, MD  ONE TOUCH ULTRA TEST test strip  09/13/13   Historical Provider, MD  PRESCRIPTION MEDICATION Place 1 drop into both eyes daily as needed. Allergy eye drop    Historical Provider, MD  Tamsulosin HCl (FLOMAX) 0.4 MG CAPS Take 0.4 mg by mouth daily.    Historical Provider, MD  vitamin B-12 (CYANOCOBALAMIN) 1000 MCG tablet Take 1,000 mcg by mouth daily.    Historical Provider, MD   BP 144/77  Pulse 103  Temp(Src) 97.9 F (36.6 C) (Oral)  Resp 20  Ht 5' 10" (1.778 m)  Wt 190 lb (86.183 kg)  BMI 27.26 kg/m2  SpO2 100% Physical Exam  Constitutional: He is oriented to person, place, and time. He appears well-developed and well-nourished.  HENT:  Head: Normocephalic and atraumatic.  Eyes: EOM are normal. Pupils are equal, round, and reactive to light.  Neck: Normal range of motion.  Cardiovascular: Normal rate and regular rhythm.   No murmur heard. Pulmonary/Chest: Effort normal and breath sounds normal. He has no wheezes. He has no rales.  Abdominal: Soft. There is no tenderness.  Neurological: He is alert and oriented to person, place, and time. He has normal strength and normal reflexes. No sensory deficit. He displays a negative Romberg sign. Coordination normal.  No deficits of coordination. Speech is clear and focused. Ambulatory without ataxia. CN's 3-12 grossly intact. There is mild lateral nystagmus.  Psychiatric: He has a normal mood and affect.    ED Course  Procedures (including critical care time) Labs Review Labs Reviewed  CBC WITH DIFFERENTIAL - Abnormal; Notable for the following:    RBC 4.10 (*)    MCH 34.4 (*)    All other components  within normal limits  COMPREHENSIVE METABOLIC PANEL - Abnormal; Notable for the following:    Sodium 133 (*)    Chloride 94 (*)    Glucose, Bld 121 (*)    All other components within normal limits  URINALYSIS, ROUTINE W REFLEX MICROSCOPIC - Abnormal; Notable for the following:    Glucose, UA 100 (*)    All other components within normal limits  CBG MONITORING, ED - Abnormal; Notable for the following:    Glucose-Capillary 126 (*)    All other components within normal limits  TROPONIN I   Results for orders placed during the hospital encounter of 02/12/14  CBC WITH DIFFERENTIAL      Result Value Ref Range   WBC 5.0  4.0 - 10.5 K/uL   RBC 4.10 (*) 4.22 - 5.81 MIL/uL   Hemoglobin 14.1  13.0 - 17.0 g/dL   HCT 39.2  39.0 - 52.0 %   MCV 95.6  78.0 -  100.0 fL   MCH 34.4 (*) 26.0 - 34.0 pg   MCHC 36.0  30.0 - 36.0 g/dL   RDW 12.1  11.5 - 15.5 %   Platelets 196  150 - 400 K/uL   Neutrophils Relative % 53  43 - 77 %   Neutro Abs 2.6  1.7 - 7.7 K/uL   Lymphocytes Relative 31  12 - 46 %   Lymphs Abs 1.5  0.7 - 4.0 K/uL   Monocytes Relative 12  3 - 12 %   Monocytes Absolute 0.6  0.1 - 1.0 K/uL   Eosinophils Relative 4  0 - 5 %   Eosinophils Absolute 0.2  0.0 - 0.7 K/uL   Basophils Relative 1  0 - 1 %   Basophils Absolute 0.1  0.0 - 0.1 K/uL  COMPREHENSIVE METABOLIC PANEL      Result Value Ref Range   Sodium 133 (*) 137 - 147 mEq/L   Potassium 4.5  3.7 - 5.3 mEq/L   Chloride 94 (*) 96 - 112 mEq/L   CO2 25  19 - 32 mEq/L   Glucose, Bld 121 (*) 70 - 99 mg/dL   BUN 17  6 - 23 mg/dL   Creatinine, Ser 0.50  0.50 - 1.35 mg/dL   Calcium 10.4  8.4 - 10.5 mg/dL   Total Protein 8.0  6.0 - 8.3 g/dL   Albumin 4.6  3.5 - 5.2 g/dL   AST 17  0 - 37 U/L   ALT 17  0 - 53 U/L   Alkaline Phosphatase 56  39 - 117 U/L   Total Bilirubin 0.4  0.3 - 1.2 mg/dL   GFR calc non Af Amer >90  >90 mL/min   GFR calc Af Amer >90  >90 mL/min   Anion gap 14  5 - 15  URINALYSIS, ROUTINE W REFLEX MICROSCOPIC       Result Value Ref Range   Color, Urine YELLOW  YELLOW   APPearance CLEAR  CLEAR   Specific Gravity, Urine 1.014  1.005 - 1.030   pH 6.5  5.0 - 8.0   Glucose, UA 100 (*) NEGATIVE mg/dL   Hgb urine dipstick NEGATIVE  NEGATIVE   Bilirubin Urine NEGATIVE  NEGATIVE   Ketones, ur NEGATIVE  NEGATIVE mg/dL   Protein, ur NEGATIVE  NEGATIVE mg/dL   Urobilinogen, UA 0.2  0.0 - 1.0 mg/dL   Nitrite NEGATIVE  NEGATIVE   Leukocytes, UA NEGATIVE  NEGATIVE  TROPONIN I      Result Value Ref Range   Troponin I <0.30  <0.30 ng/mL  CBG MONITORING, ED      Result Value Ref Range   Glucose-Capillary 126 (*) 70 - 99 mg/dL   No results found. Imaging Review No results found.   EKG Interpretation None      MDM   Final diagnoses:  None    1. Dizziness   He feels some better with IV fluids. Labs are unconcerning. He has no neurologic deficits, no speech difficulty, is not off balance and has a history of similar symptoms. Patient is evaluated by Dr. Tamera Punt who feels he is stable for discharge.     Dewaine Oats, PA-C 02/17/14 1811

## 2014-02-12 NOTE — ED Notes (Signed)
MD at bedside. 

## 2014-02-14 ENCOUNTER — Telehealth: Payer: Self-pay | Admitting: Neurology

## 2014-02-14 NOTE — Telephone Encounter (Signed)
Patient requesting an appointment with Dr. Brett Fairy.  Went to [employer] at [facility] over the weekend due to feeling unsteady and dizzy.  Please return call on home # first [phone], if no answer call cell [phone] anytime and can leave message if not available.  Thanks

## 2014-02-15 ENCOUNTER — Ambulatory Visit (INDEPENDENT_AMBULATORY_CARE_PROVIDER_SITE_OTHER): Payer: Medicare Other | Admitting: Neurology

## 2014-02-15 ENCOUNTER — Encounter: Payer: Self-pay | Admitting: Neurology

## 2014-02-15 VITALS — BP 118/71 | HR 94 | Resp 14 | Wt 192.0 lb

## 2014-02-15 DIAGNOSIS — R2689 Other abnormalities of gait and mobility: Secondary | ICD-10-CM | POA: Insufficient documentation

## 2014-02-15 DIAGNOSIS — G44229 Chronic tension-type headache, not intractable: Secondary | ICD-10-CM

## 2014-02-15 DIAGNOSIS — H8110 Benign paroxysmal vertigo, unspecified ear: Secondary | ICD-10-CM

## 2014-02-15 DIAGNOSIS — R29818 Other symptoms and signs involving the nervous system: Secondary | ICD-10-CM

## 2014-02-15 MED ORDER — MECLIZINE HCL 25 MG PO TABS: 12.5000 mg | ORAL_TABLET | Freq: Every day | ORAL | Status: DC

## 2014-02-15 MED ORDER — AMITRIPTYLINE HCL 10 MG PO TABS
10.0000 mg | ORAL_TABLET | Freq: Every day | ORAL | Status: DC
Start: 2014-02-15 — End: 2014-04-05

## 2014-02-15 NOTE — Telephone Encounter (Signed)
Patient was scheduled for today at 3:30, had a cancellation.

## 2014-02-15 NOTE — Patient Instructions (Signed)
Vertigo Vertigo means you feel like you are moving when you are not. Vertigo can make you feel like things around you are moving when they are not. This problem often goes away on its own.  HOME CARE   Follow your doctor's instructions.  Avoid driving.  Avoid using heavy machinery.  Avoid doing any activity that could be dangerous if you have a vertigo attack.  Tell your doctor if a medicine seems to cause your vertigo. GET HELP RIGHT AWAY IF:   Your medicines do not help or make you feel worse.  You have trouble talking or walking.  You feel weak or have trouble using your arms, hands, or legs.  You have bad headaches.  You keep feeling sick to your stomach (nauseous) or throwing up (vomiting).  Your vision changes.  A family member notices changes in your behavior.  Your problems get worse. MAKE SURE YOU:  Understand these instructions.  Will watch your condition.  Will get help right away if you are not doing well or get worse. Document Released: 04/09/2008 Document Revised: 09/23/2011 Document Reviewed: 01/17/2011 ExitCare Patient Information 2015 [employer]. This information is not intended to replace advice given to you by your health care provider. Make sure you discuss any questions you have with your health care provider.  

## 2014-02-15 NOTE — Progress Notes (Signed)
[facility]  Provider:  Larey Seat, M D  Referring Provider: Marjorie Smolder, MD Primary Care Physician:  Marjorie Smolder, MD  Chief Complaint  Patient presents with  . Follow-up    rm 11    HPI:  [name] is a 78 y.o. male  Is seen here as a referral   from Dr. Inda Merlin for evaluation of Tension headaches, and progressive gait disorder, walking wide based.   The patient has not tried amitriptyline. He had a vertigo spell, and gets sick when driven but not when driving , he reads at night- no vision problems, but in the morning getting up from sleep he feels worse.  He is better after lunch.   Dr. Thornell Mule has followed him ENT. Last Saturday , he went to the ER with vertigo, and had nystagmus in the horizontal plane, had pulsating tinnitus. His labs and EKG were normal.          Last visit note : CD- consult.  The patient reports a long neurologic history: While still living in [place] 1959 through 32 he had some evaluation for tightness at the ankles and dizziness . He went to the [place], lived in [place] . He had EEG evaluations for ? These were normal. He had again seen neurology for a fall related skull fracture , went for 6 days to ICU under Dr Clarice Pole care in 2009 , diagnosed with a subdural hematoma. He walks with a cane since. He lost left sided hearing , and vertigo -but the fracture was on the right high parietal skull. He likes to sleep on this right. Has hip pain, he reads and works on his computer, noticed that the seated posture provokes the tightness in the neck, he gets dizzy with looking to th right.  He has seen Dr. Melton Alar for tension neck and headaches. He is now retired, daily attending church and he has noted that he develops his tightness once he has been up and about for several minutes.          Review of Systems: Out of a complete 14 system review, the patient complains of only the following symptoms, and all other  reviewed systems are negative. Vertigo- dizziness.   History   Social History  . Marital Status: Married    Spouse Name: [name]    Number of Children: 3  . Years of Education: 13   Occupational History  . Retired    Social History Main Topics  . Smoking status: Current Every Day Smoker    Types: Cigarettes  . Smokeless tobacco: Never Used  . Alcohol Use: 7.0 oz/week    14 drink(s) per week     Comment: 1-2 before dinner  . Drug Use: No  . Sexual Activity: Not on file   Other Topics Concern  . Not on file   Social History Narrative   Patient is married [name]).   Patient has three children and 7 grandchildren.   Patient is retired.   Patient drinks one serving of coffee daily.   Patient is right-handed.   Patient has a college education.    Family History  Problem Relation Age of Onset  . Stroke Father     Past Medical History  Diagnosis Date  . Asthma   . Bronchitis   . Diabetes mellitus   . Hypertension   . Skull fracture   . Iliac artery aneurysm   . Peripheral neuropathy   . Hypercholesterolemia   .  Subdural hematoma   . Progressive gait disorder   . Progressive gait disorder 09/15/2013    History reviewed. No pertinent past surgical history.  Current Outpatient Prescriptions  Medication Sig Dispense Refill  . albuterol (PROVENTIL HFA;VENTOLIN HFA) 108 (90 BASE) MCG/ACT inhaler Inhale 2 puffs into the lungs every 6 (six) hours as needed for wheezing or shortness of breath.      Marland Kitchen amLODipine (NORVASC) 5 MG tablet Take 5 mg by mouth daily.      . Ascorbic Acid (VITAMIN C PO) Take 1 tablet by mouth daily.      Marland Kitchen aspirin 81 MG tablet Take 81 mg by mouth daily.      Marland Kitchen atorvastatin (LIPITOR) 10 MG tablet Take 10 mg by mouth daily.      . Blood Glucose Monitoring Suppl (ONE TOUCH ULTRA 2) W/DEVICE KIT       . calcium carbonate (TUMS - DOSED IN MG ELEMENTAL CALCIUM) 500 MG chewable tablet Chew 2 tablets by mouth once as needed. For indigestion      .  cyclobenzaprine (FLEXERIL) 10 MG tablet Take 10 mg by mouth at bedtime.      . dutasteride (AVODART) 0.5 MG capsule Take 0.5 mg by mouth daily.      . Fluticasone-Salmeterol (ADVAIR) 250-50 MCG/DOSE AEPB Inhale 1 puff into the lungs every 12 (twelve) hours.      Marland Kitchen lisinopril (PRINIVIL,ZESTRIL) 20 MG tablet Take 20 mg by mouth daily.      . meclizine (ANTIVERT) 25 MG tablet Take 12.5 mg by mouth daily.      . Menthol, Topical Analgesic, (ICY HOT) 7.5 % (ROLL) MISC Apply 1 each topically daily as needed. For muscle tension      . metFORMIN (GLUCOPHAGE) 500 MG tablet       . MICROLET LANCETS MISC       . Multiple Vitamin (MULTIVITAMIN WITH MINERALS) TABS Take 1 tablet by mouth daily.      . ONE TOUCH ULTRA TEST test strip       . PRESCRIPTION MEDICATION Place 1 drop into both eyes daily as needed. Allergy eye drop      . Tamsulosin HCl (FLOMAX) 0.4 MG CAPS Take 0.4 mg by mouth daily.      . vitamin B-12 (CYANOCOBALAMIN) 1000 MCG tablet Take 1,000 mcg by mouth daily.       No current facility-administered medications for this visit.    Allergies as of 02/15/2014 - Review Complete 02/15/2014  Allergen Reaction Noted  . Pneumovax 23 [pneumococcal vac polyvalent]  09/10/2013  . Prednisone Other (See Comments) 08/03/2011  . Penicillins Rash 08/03/2011  . Sulfa antibiotics Rash 08/03/2011    Vitals: BP 118/71  Pulse 94  Resp 14  Wt 192 lb (87.091 kg) Last Weight:  Wt Readings from Last 1 Encounters:  02/15/14 192 lb (87.091 kg)   Last Height:   Ht Readings from Last 1 Encounters:  02/12/14 5' 10"  (1.778 m)   Physical exam:  General: The patient is awake, alert and appears not in acute distress. The patient is well groomed. Head: Normocephalic, atraumatic. Neck is supple. Mallampati 3, neck circumference: 18 inches. Cardiovascular:  Regular rate and rhythm , without  murmurs or carotid bruit, and without distended neck veins. Respiratory: Lungs are clear to auscultation. Skin:   Without evidence of edema, or rash Trunk: BMI is elevated. Patient  has normal posture.  Neurologic exam : The patient is awake and alert, oriented to place and time.  Memory  subjective  described as intact. There is a normal attention span & concentration ability. Speech is fluent without  dysarthria, dysphonia or aphasia. Mood and affect are appropriate.  Cranial nerves: Pupils are equal and briskly reactive to light. Funduscopic exam without  evidence of pallor or edema.  Extraocular movements  in vertical and horizontal planes intact and without nystagmus. Visual fields by finger perimetry are intact. Hearing to finger rub intact.  Facial sensation intact to fine touch. Facial motor strength is symmetric and tongue and uvula move midline.  Motor exam:  Normal tone and normal muscle bulk and symmetric normal strength in all extremities.  Sensory:  Fine touch, pinprick and vibration were tested in all extremities. Loss of vibratory sensation is noted from ankle downwards bilateral.   Coordination: Rapid alternating movements in the fingers/hands is tested and normal.  Finger-to-nose maneuver tested and normal without evidence of ataxia, dysmetria or tremor.  Gait and station: Patient walks without assistive device. Strength within normal limits. He walks with long strides. Stance is stable and normal. Tandem gait is unfragmented.  Romberg testing is normal.  Deep tendon reflexes: in the upper and lower extremities are symmetric, downgoing.   Assessment:  After physical and neurologic examination, review of laboratory studies, imaging, neurophysiology testing and pre-existing records, assessment is;  1) diabetic neuropathy 2) vestibular dysfunction, residual since skull fracture.  3) multifactorial gait disorder.  Imbalance, worse with downwards gaze. Marland Kitchen  4) Tension neck pain .  Plan:  Treatment plan and additional workup : 1) Elavil at night lowest dose at night .  2) vestibular  evaluation did not improve the sensation of vertigo.  3) PT for balance is working well.     Veer Elamin, MD

## 2014-02-17 NOTE — Telephone Encounter (Signed)
Noted  

## 2014-02-18 NOTE — ED Provider Notes (Signed)
Medical screening examination/treatment/procedure(s) were conducted as a shared visit with non-physician practitioner(s) and myself.  I personally evaluated the patient during the encounter.   EKG Interpretation   Date/Time:  Saturday February 12 2014 11:53:34 EDT Ventricular Rate:  95 PR Interval:  144 QRS Duration: 90 QT Interval:  340 QTC Calculation: 427 R Axis:   76 Text Interpretation:  Normal sinus rhythm Normal ECG since last tracing no  significant change Confirmed by Nyjah Schwake  MD, Naziah Weckerly ([id number]) on 02/12/2014  4:05:41 PM      Pt with some vertigo symptoms, fatigue, similar to past episodes.  No nystagmus, no neuro deficits, ambulating without problem, no symptoms consistent with ACS.  Pt feels better, will go home to f/u with his doctor  Malvin Johns, MD 02/18/14 [phone]

## 2014-03-03 ENCOUNTER — Ambulatory Visit: Payer: Medicare Other | Admitting: Neurology

## 2014-03-28 ENCOUNTER — Other Ambulatory Visit: Payer: Self-pay | Admitting: Vascular Surgery

## 2014-03-28 ENCOUNTER — Encounter: Payer: Self-pay | Admitting: Vascular Surgery

## 2014-03-28 LAB — BUN: BUN: 18 mg/dL (ref 6–23)

## 2014-03-28 LAB — CREATININE, SERUM: CREATININE: 0.65 mg/dL (ref 0.50–1.35)

## 2014-03-29 ENCOUNTER — Ambulatory Visit
Admission: RE | Admit: 2014-03-29 | Discharge: 2014-03-29 | Disposition: A | Discharge status: Home / Self Care | Payer: Medicare Other | Source: Ambulatory Visit | Attending: Vascular Surgery | Admitting: Vascular Surgery

## 2014-03-29 ENCOUNTER — Ambulatory Visit (INDEPENDENT_AMBULATORY_CARE_PROVIDER_SITE_OTHER): Payer: Medicare Other | Admitting: Vascular Surgery

## 2014-03-29 ENCOUNTER — Encounter: Payer: Self-pay | Admitting: Vascular Surgery

## 2014-03-29 ENCOUNTER — Inpatient Hospital Stay: Admission: RE | Admit: 2014-03-29 | Payer: Medicare Other | Source: Ambulatory Visit

## 2014-03-29 VITALS — BP 119/91 | HR 99 | Ht 70.0 in | Wt 192.0 lb

## 2014-03-29 DIAGNOSIS — I723 Aneurysm of iliac artery: Secondary | ICD-10-CM

## 2014-03-29 DIAGNOSIS — I728 Aneurysm of other specified arteries: Secondary | ICD-10-CM

## 2014-03-29 MED ORDER — IOHEXOL 350 MG/ML SOLN
80.0000 mL | Freq: Once | INTRAVENOUS | Status: AC | PRN
Start: 2014-03-29 — End: 2014-03-29
  Administered 2014-03-29: 80 mL via INTRAVENOUS

## 2014-03-29 NOTE — Progress Notes (Signed)
Subjective:     Patient ID: [name], male   DOB: 1935-11-05, 78 y.o.   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 He has had no abdominal or back symptoms since I last saw him one year ago. He does have some right hip problems which will be addressed in the near future.  Past Medical History  Diagnosis Date  . Asthma   . Bronchitis   . Diabetes mellitus   . Hypertension   . Skull fracture   . Iliac artery aneurysm   . Peripheral neuropathy   . Hypercholesterolemia   . Subdural hematoma   . Progressive gait disorder   . 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 COPD (chronic obstructive pulmonary disease)     History  Substance Use Topics  . Smoking status: Current Every Day Smoker -- 0.50 packs/day for 60 years    Types: Cigarettes  . Smokeless tobacco: Never Used  . Alcohol Use: 7.0 oz/week    14 drink(s) per week     Comment: 1-2 before dinner    Family History  Problem Relation Age of Onset  . Stroke Father     Allergies  Allergen Reactions  . Pneumovax 23 [Pneumococcal Vac Polyvalent]     Fever, sweats  . Prednisone Other (See Comments)    Sweating with high dose  . Penicillins Rash  . Sulfa Antibiotics Rash    Current outpatient prescriptions:amLODipine (NORVASC) 5 MG tablet, Take 5 mg by mouth daily., Disp: , Rfl: ;  atorvastatin (LIPITOR) 10 MG tablet, Take 10 mg by mouth daily., Disp: , Rfl: ;  cyclobenzaprine (FLEXERIL) 10 MG tablet, Take 10 mg by mouth 3 (three) times daily as needed for muscle spasms., Disp: , Rfl: ;  Fluticasone-Salmeterol (ADVAIR) 250-50 MCG/DOSE AEPB, Inhale 1 puff into the lungs every 12 (twelve) hours., Disp: , Rfl:  lisinopril (PRINIVIL,ZESTRIL) 20 MG tablet, Take 20 mg by mouth daily., Disp: , Rfl: ;  meclizine (ANTIVERT) 25 MG tablet, Take 0.5 tablets (12.5 mg total) by mouth daily., Disp: 30 tablet,  Rfl: 1;  metFORMIN (GLUCOPHAGE) 500 MG tablet, , Disp: , Rfl: ;  Tamsulosin HCl (FLOMAX) 0.4 MG CAPS, Take 0.4 mg by mouth daily., Disp: , Rfl:  albuterol (PROVENTIL HFA;VENTOLIN HFA) 108 (90 BASE) MCG/ACT inhaler, Inhale 2 puffs into the lungs every 6 (six) hours as needed for wheezing or shortness of breath., Disp: , Rfl: ;  amitriptyline (ELAVIL) 10 MG tablet, Take 1 tablet (10 mg total) by mouth at bedtime., Disp: 30 tablet, Rfl: 3;  Ascorbic Acid (VITAMIN C PO), Take 1 tablet by mouth daily., Disp: , Rfl: ;  aspirin 81 MG tablet, Take 81 mg by mouth daily., Disp: , Rfl:  Blood Glucose Monitoring Suppl (ONE TOUCH ULTRA 2) W/DEVICE KIT, , Disp: , Rfl: ;  calcium carbonate (TUMS - DOSED IN MG ELEMENTAL CALCIUM) 500 MG chewable tablet, Chew 2 tablets by mouth once as needed. For indigestion, Disp: , Rfl: ;  dutasteride (AVODART) 0.5 MG capsule, Take 0.5 mg by mouth daily., Disp: , Rfl: ;  Menthol, Topical Analgesic, (ICY HOT) 7.5 % (ROLL) MISC, Apply 1 each topically daily as needed. For muscle tension, Disp: , Rfl:  MICROLET LANCETS MISC, , Disp: , Rfl: ;  Multiple Vitamin (MULTIVITAMIN WITH MINERALS) TABS, Take 1 tablet by mouth daily., Disp: , Rfl: ;  ONE TOUCH ULTRA TEST test strip, ,  Disp: , Rfl: ;  PRESCRIPTION MEDICATION, Place 1 drop into both eyes daily as needed. Allergy eye drop, Disp: , Rfl: ;  vitamin B-12 (CYANOCOBALAMIN) 1000 MCG tablet, Take 1,000 mcg by mouth daily., Disp: , Rfl:   BP 119/91  Pulse 99  Ht 5' 10"  (1.778 m)  Wt 192 lb (87.091 kg)  BMI 27.55 kg/m2  SpO2 98%  Body mass index is 27.55 kg/(m^2).          Review of Systems denies chest pain. Does have history of asthma, peripheral neuropathy, right hip discomfort. Denies lateralizing weakness, aphasia, amaurosis fugax, syncope. All other systems negative the complete review of systems    Objective:   Physical Exam BP 119/91  Pulse 99  Ht 5' 10"  (1.778 m)  Wt 192 lb (87.091 kg)  BMI 27.55 kg/m2  SpO2  98%  Gen.-alert and oriented x3 in no apparent distress HEENT normal for age Lungs no rhonchi or wheezing Cardiovascular regular rhythm no murmurs carotid pulses 3+ palpable no bruits audible Abdomen soft nontender no palpable masses Musculoskeletal free of  major deformities Skin clear -no rashes Neurologic normal Lower extremities 3+ femoral and dorsalis pedis pulses palpable bilaterally with no edema  Today I ordered a CT angiogram of the abdomen and pelvis which are reviewed by computer. Right common iliac artery aneurysm is unchanged at 2.6 cm. The area of slight dilatation in the celiac axis is 14 mm and unchanged.        Assessment:     Stable right common iliac artery aneurysm-2.6 cm an area of dilatation of celiac axis    Plan:     Will repeat study in 2 years

## 2014-03-29 NOTE — Addendum Note (Signed)
Addended by: Mena Goes on: 03/29/2014 03:23 PM   Modules accepted: Orders

## 2014-04-05 ENCOUNTER — Encounter (HOSPITAL_BASED_OUTPATIENT_CLINIC_OR_DEPARTMENT_OTHER): Payer: Self-pay | Admitting: Emergency Medicine

## 2014-04-05 ENCOUNTER — Inpatient Hospital Stay (HOSPITAL_BASED_OUTPATIENT_CLINIC_OR_DEPARTMENT_OTHER)
Admission: EM | Admit: 2014-04-05 | Discharge: 2014-04-08 | DRG: 194 | Disposition: A | Discharge status: Home / Self Care | Payer: Medicare Other | Attending: Internal Medicine | Admitting: Internal Medicine

## 2014-04-05 ENCOUNTER — Emergency Department (HOSPITAL_BASED_OUTPATIENT_CLINIC_OR_DEPARTMENT_OTHER): Payer: Medicare Other

## 2014-04-05 DIAGNOSIS — E871 Hypo-osmolality and hyponatremia: Secondary | ICD-10-CM | POA: Diagnosis present

## 2014-04-05 DIAGNOSIS — I10 Essential (primary) hypertension: Secondary | ICD-10-CM | POA: Diagnosis present

## 2014-04-05 DIAGNOSIS — J440 Chronic obstructive pulmonary disease with acute lower respiratory infection: Secondary | ICD-10-CM | POA: Diagnosis present

## 2014-04-05 DIAGNOSIS — E785 Hyperlipidemia, unspecified: Secondary | ICD-10-CM | POA: Diagnosis present

## 2014-04-05 DIAGNOSIS — J209 Acute bronchitis, unspecified: Secondary | ICD-10-CM | POA: Diagnosis present

## 2014-04-05 DIAGNOSIS — E119 Type 2 diabetes mellitus without complications: Secondary | ICD-10-CM | POA: Diagnosis present

## 2014-04-05 DIAGNOSIS — G609 Hereditary and idiopathic neuropathy, unspecified: Secondary | ICD-10-CM | POA: Diagnosis present

## 2014-04-05 DIAGNOSIS — J189 Pneumonia, unspecified organism: Secondary | ICD-10-CM | POA: Diagnosis not present

## 2014-04-05 DIAGNOSIS — J45901 Unspecified asthma with (acute) exacerbation: Secondary | ICD-10-CM

## 2014-04-05 DIAGNOSIS — J441 Chronic obstructive pulmonary disease with (acute) exacerbation: Secondary | ICD-10-CM | POA: Diagnosis present

## 2014-04-05 DIAGNOSIS — E118 Type 2 diabetes mellitus with unspecified complications: Secondary | ICD-10-CM | POA: Diagnosis present

## 2014-04-05 LAB — CBC WITH DIFFERENTIAL/PLATELET
Basophils Absolute: 0 10*3/uL (ref 0.0–0.1)
Basophils Relative: 0 % (ref 0–1)
EOS ABS: 0.1 10*3/uL (ref 0.0–0.7)
EOS PCT: 1 % (ref 0–5)
HCT: 34.5 % — ABNORMAL LOW (ref 39.0–52.0)
HEMOGLOBIN: 12.3 g/dL — AB (ref 13.0–17.0)
LYMPHS PCT: 13 % (ref 12–46)
Lymphs Abs: 1.2 10*3/uL (ref 0.7–4.0)
MCH: 34.2 pg — ABNORMAL HIGH (ref 26.0–34.0)
MCHC: 35.7 g/dL (ref 30.0–36.0)
MCV: 95.8 fL (ref 78.0–100.0)
MONOS PCT: 11 % (ref 3–12)
Monocytes Absolute: 1 10*3/uL (ref 0.1–1.0)
NEUTROS ABS: 6.4 10*3/uL (ref 1.7–7.7)
NEUTROS PCT: 75 % (ref 43–77)
Platelets: 218 10*3/uL (ref 150–400)
RBC: 3.6 MIL/uL — ABNORMAL LOW (ref 4.22–5.81)
RDW: 12 % (ref 11.5–15.5)
WBC: 8.6 10*3/uL (ref 4.0–10.5)

## 2014-04-05 LAB — BASIC METABOLIC PANEL
Anion gap: 16 — ABNORMAL HIGH (ref 5–15)
BUN: 16 mg/dL (ref 6–23)
CO2: 23 meq/L (ref 19–32)
Calcium: 9.8 mg/dL (ref 8.4–10.5)
Chloride: 92 mEq/L — ABNORMAL LOW (ref 96–112)
Creatinine, Ser: 0.5 mg/dL (ref 0.50–1.35)
GFR calc Af Amer: >90 mL/min (ref >90)
GLUCOSE: 223 mg/dL — AB (ref 70–99)
Potassium: 4.5 mEq/L (ref 3.7–5.3)
SODIUM: 131 meq/L — AB (ref 137–147)

## 2014-04-05 LAB — GLUCOSE, CAPILLARY
Glucose-Capillary: 239 mg/dL — ABNORMAL HIGH (ref 70–99)
Glucose-Capillary: 310 mg/dL — ABNORMAL HIGH (ref 70–99)

## 2014-04-05 LAB — TROPONIN I

## 2014-04-05 LAB — I-STAT CG4 LACTIC ACID, ED: LACTIC ACID, VENOUS: 1.6 mmol/L (ref 0.5–2.2)

## 2014-04-05 MED ORDER — LEVOFLOXACIN 750 MG PO TABS: 750.0000 mg | ORAL_TABLET | Freq: Every day | ORAL | Status: DC

## 2014-04-05 MED ORDER — SODIUM CHLORIDE 0.9 % IV SOLN
INTRAVENOUS | Status: DC
  Administered 2014-04-05 – 2014-04-07 (×3): via INTRAVENOUS

## 2014-04-05 MED ORDER — DUTASTERIDE 0.5 MG PO CAPS
0.5000 mg | ORAL_CAPSULE | Freq: Every day | ORAL | Status: DC
  Administered 2014-04-05 – 2014-04-08 (×4): 0.5 mg via ORAL
  Filled 2014-04-05 (×5): qty 1

## 2014-04-05 MED ORDER — METHYLPREDNISOLONE SODIUM SUCC 125 MG IJ SOLR
125.0000 mg | Freq: Once | INTRAMUSCULAR | Status: AC
  Administered 2014-04-05: 125 mg via INTRAVENOUS
  Filled 2014-04-05: qty 2

## 2014-04-05 MED ORDER — ATORVASTATIN CALCIUM 10 MG PO TABS
10.0000 mg | ORAL_TABLET | Freq: Every day | ORAL | Status: DC
  Administered 2014-04-05 – 2014-04-08 (×4): 10 mg via ORAL
  Filled 2014-04-05 (×4): qty 1

## 2014-04-05 MED ORDER — LISINOPRIL 20 MG PO TABS
20.0000 mg | ORAL_TABLET | Freq: Every day | ORAL | Status: DC
  Administered 2014-04-06 – 2014-04-08 (×3): 20 mg via ORAL
  Filled 2014-04-05 (×3): qty 1

## 2014-04-05 MED ORDER — VITAMIN C 500 MG PO TABS
500.0000 mg | ORAL_TABLET | Freq: Every day | ORAL | Status: DC
Start: 2014-04-06 — End: 2014-04-08
  Administered 2014-04-06 – 2014-04-08 (×3): 500 mg via ORAL
  Filled 2014-04-05 (×3): qty 1

## 2014-04-05 MED ORDER — IPRATROPIUM-ALBUTEROL 0.5-2.5 (3) MG/3ML IN SOLN
RESPIRATORY_TRACT | Status: AC
  Administered 2014-04-05: 3 mL via RESPIRATORY_TRACT
  Filled 2014-04-05: qty 3

## 2014-04-05 MED ORDER — IPRATROPIUM-ALBUTEROL 0.5-2.5 (3) MG/3ML IN SOLN
3.0000 mL | RESPIRATORY_TRACT | Status: DC
  Administered 2014-04-05: 3 mL via RESPIRATORY_TRACT
  Filled 2014-04-05: qty 3

## 2014-04-05 MED ORDER — ENOXAPARIN SODIUM 40 MG/0.4ML ~~LOC~~ SOLN
40.0000 mg | SUBCUTANEOUS | Status: DC
  Administered 2014-04-05 – 2014-04-07 (×3): 40 mg via SUBCUTANEOUS
  Filled 2014-04-05 (×4): qty 0.4

## 2014-04-05 MED ORDER — ONDANSETRON HCL 4 MG/2ML IJ SOLN: 4.0000 mg | Freq: Four times a day (QID) | INTRAMUSCULAR | Status: DC | PRN

## 2014-04-05 MED ORDER — ONDANSETRON HCL 4 MG PO TABS
4.0000 mg | ORAL_TABLET | Freq: Four times a day (QID) | ORAL | Status: DC | PRN
Start: 2014-04-05 — End: 2014-04-08

## 2014-04-05 MED ORDER — IPRATROPIUM-ALBUTEROL 0.5-2.5 (3) MG/3ML IN SOLN
3.0000 mL | Freq: Once | RESPIRATORY_TRACT | Status: AC
  Administered 2014-04-05: 3 mL via RESPIRATORY_TRACT

## 2014-04-05 MED ORDER — OXYCODONE HCL 5 MG PO TABS
5.0000 mg | ORAL_TABLET | ORAL | Status: DC | PRN
  Administered 2014-04-07: 5 mg via ORAL
  Filled 2014-04-05: qty 1

## 2014-04-05 MED ORDER — TAMSULOSIN HCL 0.4 MG PO CAPS
0.4000 mg | ORAL_CAPSULE | Freq: Every day | ORAL | Status: DC
  Administered 2014-04-05 – 2014-04-08 (×4): 0.4 mg via ORAL
  Filled 2014-04-05 (×4): qty 1

## 2014-04-05 MED ORDER — ADULT MULTIVITAMIN W/MINERALS CH
1.0000 | ORAL_TABLET | Freq: Every day | ORAL | Status: DC
  Administered 2014-04-06 – 2014-04-08 (×3): 1 via ORAL
  Filled 2014-04-05 (×3): qty 1

## 2014-04-05 MED ORDER — ACETAMINOPHEN 325 MG PO TABS
650.0000 mg | ORAL_TABLET | Freq: Four times a day (QID) | ORAL | Status: DC | PRN
  Administered 2014-04-06 – 2014-04-07 (×4): 650 mg via ORAL
  Filled 2014-04-05 (×4): qty 2

## 2014-04-05 MED ORDER — AMLODIPINE BESYLATE 5 MG PO TABS
5.0000 mg | ORAL_TABLET | Freq: Every day | ORAL | Status: DC
  Administered 2014-04-06 – 2014-04-08 (×3): 5 mg via ORAL
  Filled 2014-04-05 (×4): qty 1

## 2014-04-05 MED ORDER — HYDROMORPHONE HCL 1 MG/ML IJ SOLN: 0.5000 mg | INTRAMUSCULAR | Status: DC | PRN

## 2014-04-05 MED ORDER — IPRATROPIUM-ALBUTEROL 0.5-2.5 (3) MG/3ML IN SOLN
3.0000 mL | Freq: Three times a day (TID) | RESPIRATORY_TRACT | Status: DC
  Administered 2014-04-06 – 2014-04-08 (×8): 3 mL via RESPIRATORY_TRACT
  Filled 2014-04-05 (×8): qty 3

## 2014-04-05 MED ORDER — ALBUTEROL SULFATE (2.5 MG/3ML) 0.083% IN NEBU
INHALATION_SOLUTION | RESPIRATORY_TRACT | Status: AC
  Administered 2014-04-05: 2.5 mg via RESPIRATORY_TRACT
  Filled 2014-04-05: qty 3

## 2014-04-05 MED ORDER — ALUM & MAG HYDROXIDE-SIMETH 200-200-20 MG/5ML PO SUSP: 30.0000 mL | Freq: Four times a day (QID) | ORAL | Status: DC | PRN

## 2014-04-05 MED ORDER — INSULIN ASPART 100 UNIT/ML ~~LOC~~ SOLN
0.0000 [IU] | Freq: Three times a day (TID) | SUBCUTANEOUS | Status: DC
  Administered 2014-04-06 (×2): 3 [IU] via SUBCUTANEOUS
  Administered 2014-04-06 – 2014-04-07 (×2): 2 [IU] via SUBCUTANEOUS
  Administered 2014-04-07: 5 [IU] via SUBCUTANEOUS
  Administered 2014-04-07: 3 [IU] via SUBCUTANEOUS
  Administered 2014-04-08 (×2): 5 [IU] via SUBCUTANEOUS

## 2014-04-05 MED ORDER — SODIUM CHLORIDE 0.9 % IV BOLUS (SEPSIS)
1000.0000 mL | Freq: Once | INTRAVENOUS | Status: AC
  Administered 2014-04-05: 1000 mL via INTRAVENOUS

## 2014-04-05 MED ORDER — LEVOFLOXACIN IN D5W 750 MG/150ML IV SOLN
750.0000 mg | INTRAVENOUS | Status: DC
  Administered 2014-04-06 – 2014-04-08 (×3): 750 mg via INTRAVENOUS
  Filled 2014-04-05 (×3): qty 150

## 2014-04-05 MED ORDER — ACETAMINOPHEN 650 MG RE SUPP: 650.0000 mg | Freq: Four times a day (QID) | RECTAL | Status: DC | PRN

## 2014-04-05 MED ORDER — LEVOFLOXACIN IN D5W 750 MG/150ML IV SOLN
750.0000 mg | Freq: Once | INTRAVENOUS | Status: AC
  Administered 2014-04-05: 750 mg via INTRAVENOUS
  Filled 2014-04-05: qty 150

## 2014-04-05 MED ORDER — INSULIN ASPART 100 UNIT/ML ~~LOC~~ SOLN
0.0000 [IU] | Freq: Every day | SUBCUTANEOUS | Status: DC
  Administered 2014-04-05: 4 [IU] via SUBCUTANEOUS
  Administered 2014-04-06: 2 [IU] via SUBCUTANEOUS
  Administered 2014-04-07: 3 [IU] via SUBCUTANEOUS

## 2014-04-05 MED ORDER — ASPIRIN 81 MG PO CHEW
81.0000 mg | CHEWABLE_TABLET | Freq: Every day | ORAL | Status: DC
  Administered 2014-04-06 – 2014-04-08 (×3): 81 mg via ORAL
  Filled 2014-04-05 (×3): qty 1

## 2014-04-05 MED ORDER — ALBUTEROL SULFATE (2.5 MG/3ML) 0.083% IN NEBU
2.5000 mg | INHALATION_SOLUTION | Freq: Once | RESPIRATORY_TRACT | Status: AC
  Administered 2014-04-05: 2.5 mg via RESPIRATORY_TRACT

## 2014-04-05 NOTE — ED Provider Notes (Signed)
TIME SEEN: 12:05 PM  CHIEF COMPLAINT: Fever, cough, congestion  HPI: Patient is a 78 year old male with history of asthma and tobacco use, hypertension, diabetes, hyperlipidemia who presents to the emergency department with complaints of subjective fevers, night sweats, cough, nasal congestion and shortness of breath that started last week. He was seen by his primary care physician on Thursday, 5 days ago. He states he did not have a chest x-ray was given antibiotic at that time. He went to urgent care on Saturday, 3 days ago and had a chest x-ray was diagnosed with bronchitis and started on azithromycin. He reports he has not had any improvement of his symptoms. Wife reports that he had an episode of diaphoresis last night. He denies any chest pain or chest discomfort. No vomiting or diarrhea.  ROS: See HPI Constitutional: no fever  Eyes: no drainage  ENT: no runny nose   Cardiovascular:  no chest pain  Resp:  SOB  GI: no vomiting GU: no dysuria Integumentary: no rash  Allergy: no hives  Musculoskeletal: no leg swelling  Neurological: no slurred speech ROS otherwise negative  PAST MEDICAL HISTORY/PAST SURGICAL HISTORY:  Past Medical History  Diagnosis Date  . Asthma   . Bronchitis   . Diabetes mellitus   . Hypertension   . Skull fracture   . Iliac artery aneurysm   . Peripheral neuropathy   . Hypercholesterolemia   . Subdural hematoma   . Progressive gait disorder   . 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 COPD (chronic obstructive pulmonary disease)     MEDICATIONS:  Prior to Admission medications   Medication Sig Start Date End Date Taking? Authorizing Provider  albuterol (PROVENTIL HFA;VENTOLIN HFA) 108 (90 BASE) MCG/ACT inhaler Inhale 2 puffs into the lungs every 6 (six) hours as needed for wheezing or shortness of breath.    Historical Provider, MD  amitriptyline (ELAVIL) 10 MG tablet Take 1 tablet (10 mg total) by mouth at bedtime. 02/15/14   Asencion Partridge Dohmeier, MD   amLODipine (NORVASC) 5 MG tablet Take 5 mg by mouth daily.    Historical Provider, MD  Ascorbic Acid (VITAMIN C PO) Take 1 tablet by mouth daily.    Historical Provider, MD  aspirin 81 MG tablet Take 81 mg by mouth daily.    Historical Provider, MD  atorvastatin (LIPITOR) 10 MG tablet Take 10 mg by mouth daily.    Historical Provider, MD  Blood Glucose Monitoring Suppl (ONE TOUCH ULTRA 2) W/DEVICE KIT  09/01/13   Historical Provider, MD  calcium carbonate (TUMS - DOSED IN MG ELEMENTAL CALCIUM) 500 MG chewable tablet Chew 2 tablets by mouth once as needed. For indigestion    Historical Provider, MD  cyclobenzaprine (FLEXERIL) 10 MG tablet Take 10 mg by mouth 3 (three) times daily as needed for muscle spasms.    Historical Provider, MD  dutasteride (AVODART) 0.5 MG capsule Take 0.5 mg by mouth daily.    Historical Provider, MD  Fluticasone-Salmeterol (ADVAIR) 250-50 MCG/DOSE AEPB Inhale 1 puff into the lungs every 12 (twelve) hours.    Historical Provider, MD  lisinopril (PRINIVIL,ZESTRIL) 20 MG tablet Take 20 mg by mouth daily.    Historical Provider, MD  meclizine (ANTIVERT) 25 MG tablet Take 0.5 tablets (12.5 mg total) by mouth daily. 02/15/14   Asencion Partridge Dohmeier, MD  Menthol, Topical Analgesic, (ICY HOT) 7.5 % (ROLL) MISC Apply 1 each topically daily as needed. For muscle tension    Historical Provider, MD  metFORMIN (GLUCOPHAGE) 500 MG tablet  08/27/13  Historical Provider, MD  MICROLET LANCETS [place]  08/27/13   Historical Provider, MD  Multiple Vitamin (MULTIVITAMIN WITH MINERALS) TABS Take 1 tablet by mouth daily.    Historical Provider, MD  ONE TOUCH ULTRA TEST test strip  09/13/13   Historical Provider, MD  PRESCRIPTION MEDICATION Place 1 drop into both eyes daily as needed. Allergy eye drop    Historical Provider, MD  Tamsulosin HCl (FLOMAX) 0.4 MG CAPS Take 0.4 mg by mouth daily.    Historical Provider, MD  vitamin B-12 (CYANOCOBALAMIN) 1000 MCG tablet Take 1,000 mcg by mouth daily.    Historical  Provider, MD    ALLERGIES:  Allergies  Allergen Reactions  . Pneumovax 23 [Pneumococcal Vac Polyvalent]     Fever, sweats  . Prednisone Other (See Comments)    Sweating with high dose  . Penicillins Rash  . Sulfa Antibiotics Rash    SOCIAL HISTORY:  History  Substance Use Topics  . Smoking status: Current Every Day Smoker -- 0.50 packs/day for 60 years    Types: Cigarettes  . Smokeless tobacco: Never Used  . Alcohol Use: 7.0 oz/week    14 drink(s) per week     Comment: 1-2 before dinner    FAMILY HISTORY: Family History  Problem Relation Age of Onset  . Stroke Father     EXAM: BP 127/69  Pulse 110  Temp(Src) 99.4 F (37.4 C) (Oral)  Resp 18  SpO2 94% CONSTITUTIONAL: Alert and oriented and responds appropriately to questions. Well-appearing; well-nourished HEAD: Normocephalic EYES: Conjunctivae clear, PERRL ENT: normal nose; no rhinorrhea; moist mucous membranes; pharynx without lesions noted NECK: Supple, no meningismus, no LAD  CARD: Regular and tachycardic; S1 and S2 appreciated; no murmurs, no clicks, no rubs, no gallops RESP: Normal chest excursion without splinting or tachypnea; breath sounds clear and equal bilaterally; no wheezes, no rhonchi, no rales, , diminished at bases bilaterally  ABD/GI: Normal bowel sounds; non-distended; soft, non-tender, no rebound, no guarding BACK:  The back appears normal and is non-tender to palpation, there is no CVA tenderness EXT: Normal ROM in all joints; non-tender to palpation; no edema; normal capillary refill; no cyanosis    SKIN: Normal color for age and race; warm NEURO: Moves all extremities equally PSYCH: The patient's mood and manner are appropriate. Grooming and personal hygiene are appropriate.  MEDICAL DECISION MAKING: Patient here with URI. May be viral versus pneumonia versus asthma/COPD exacerbation. We'll give DuoNeb treatment, obtain chest x-ray, labs including troponin as he did have an episode of  diaphoresis last night. EKG shows sinus tachycardia but no other ischemic changes.  ED PROGRESS: Chest x-ray shows bilateral pneumonia. He has no leukocytosis. No hypoxia. Will give IV Levaquin for community-acquired pneumonia. Oxygen saturation is 92% at rest and with ambulating. Would ambulate patient does become very short of breath, speaking short sentences, increased work of breathing. I feel he will need admission as he has failed outpatient treatment.   2:03 PM  D/w Dr. Algis Liming for admission at [facility] (per pt preference).   EKG Interpretation  Date/Time:  Tuesday April 05 2014 12:28:12 EDT Ventricular Rate:  101 PR Interval:  134 QRS Duration: 90 QT Interval:  338 QTC Calculation: 438 R Axis:   69 Text Interpretation:  Sinus tachycardia Otherwise normal ECG Confirmed by WARD,  DO, KRISTEN ([id number]) on 04/05/2014 12:44:24 PM        [place], DO 04/05/14 1403

## 2014-04-05 NOTE — ED Notes (Signed)
C/o nonprod cough, head and chest congestion started last week-was seen by PCP 9/17-no meds started-seen at urgent care 9/19-dx with bronchitis and started on zpack (last dose to complete tomorrow)-pt reports e is not getting better and feels increase in SOB with diaphoresis at night-low grade fever

## 2014-04-05 NOTE — ED Notes (Signed)
Resting SpO2 92% HR 108, RR 16 and ambulating in here the ED HR increased to 118, SpO2 remained 92% and RR increased to 24.  Pt dizzy at times. MD notified.

## 2014-04-05 NOTE — Progress Notes (Signed)
ANTIBIOTIC CONSULT NOTE - INITIAL  Pharmacy Consult:  Levaquin Indication:  CAP  Allergies  Allergen Reactions  . Pneumovax 23 [Pneumococcal Vac Polyvalent]     Fever, sweats  . Prednisone Other (See Comments)    Sweating with high dose  . Penicillins Rash  . Sulfa Antibiotics Rash    Patient Measurements: Height: 5\' 10"  (177.8 cm) Weight: 192 lb (87.091 kg) IBW/kg (Calculated) : 73  Vital Signs: Temp: 99.4 F (37.4 C) (09/22 1134) Temp src: Oral (09/22 1134) BP: 127/69 mmHg (09/22 1134) Pulse Rate: 110 (09/22 1134)  Labs:  Recent Labs  04/05/14 1215  WBC 8.6  HGB 12.3*  PLT 218   Estimated Creatinine Clearance: 82.4 ml/min (by C-G formula based on Cr of 0.65). No results found for this basename: VANCOTROUGH, VANCOPEAK, VANCORANDOM, GENTTROUGH, GENTPEAK, GENTRANDOM, TOBRATROUGH, TOBRAPEAK, TOBRARND, AMIKACINPEAK, AMIKACINTROU, AMIKACIN,  in the last 72 hours   Microbiology: No results found for this or any previous visit (from the past 720 hour(s)).  Medical History: Past Medical History  Diagnosis Date  . Asthma   . Bronchitis   . Diabetes mellitus   . Hypertension   . Skull fracture   . Iliac artery aneurysm   . Peripheral neuropathy   . Hypercholesterolemia   . Subdural hematoma   . Progressive gait disorder   . 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 COPD (chronic obstructive pulmonary disease)      Assessment: 9 YOM with CAP who did not respond to outpatient Zpack.  Pharmacy consulted to initiate Levaquin.  Baseline labs reviewed.   Goal of Therapy:  Clearance of infection   Plan:  - Levaquin 750mg  PO Q24H - Pharmacy will sign off as dosage adjustment likely unnecessary.  Thank you for the consult!    Elta Angell D. Mina Marble, PharmD, BCPS Pager:  [phone] 04/05/2014, 1:02 PM

## 2014-04-05 NOTE — ED Notes (Signed)
EDP notified Pharm order for levaquin 750mg  po-orders to d/c po order and give IV

## 2014-04-06 DIAGNOSIS — J441 Chronic obstructive pulmonary disease with (acute) exacerbation: Secondary | ICD-10-CM

## 2014-04-06 DIAGNOSIS — E871 Hypo-osmolality and hyponatremia: Secondary | ICD-10-CM | POA: Diagnosis present

## 2014-04-06 DIAGNOSIS — E785 Hyperlipidemia, unspecified: Secondary | ICD-10-CM | POA: Diagnosis present

## 2014-04-06 DIAGNOSIS — E118 Type 2 diabetes mellitus with unspecified complications: Secondary | ICD-10-CM | POA: Diagnosis present

## 2014-04-06 LAB — BASIC METABOLIC PANEL
Anion gap: 12 (ref 5–15)
BUN: 20 mg/dL (ref 6–23)
CALCIUM: 9.2 mg/dL (ref 8.4–10.5)
CO2: 25 mEq/L (ref 19–32)
CREATININE: 0.53 mg/dL (ref 0.50–1.35)
Chloride: 96 mEq/L (ref 96–112)
GFR calc non Af Amer: >90 mL/min (ref >90)
Glucose, Bld: 282 mg/dL — ABNORMAL HIGH (ref 70–99)
POTASSIUM: 4.3 meq/L (ref 3.7–5.3)
Sodium: 133 mEq/L — ABNORMAL LOW (ref 137–147)

## 2014-04-06 LAB — NA AND K (SODIUM & POTASSIUM), RAND UR
Potassium Urine: 46 mEq/L
SODIUM UR: 49 meq/L

## 2014-04-06 LAB — CBC
HCT: 31.6 % — ABNORMAL LOW (ref 39.0–52.0)
Hemoglobin: 11.4 g/dL — ABNORMAL LOW (ref 13.0–17.0)
MCH: 34.5 pg — AB (ref 26.0–34.0)
MCHC: 36.1 g/dL — AB (ref 30.0–36.0)
MCV: 95.8 fL (ref 78.0–100.0)
PLATELETS: 216 10*3/uL (ref 150–400)
RBC: 3.3 MIL/uL — ABNORMAL LOW (ref 4.22–5.81)
RDW: 12.7 % (ref 11.5–15.5)
WBC: 8.2 10*3/uL (ref 4.0–10.5)

## 2014-04-06 LAB — GLUCOSE, CAPILLARY
GLUCOSE-CAPILLARY: 225 mg/dL — AB (ref 70–99)
GLUCOSE-CAPILLARY: 196 mg/dL — AB (ref 70–99)
Glucose-Capillary: 221 mg/dL — ABNORMAL HIGH (ref 70–99)
Glucose-Capillary: 229 mg/dL — ABNORMAL HIGH (ref 70–99)

## 2014-04-06 LAB — HEMOGLOBIN A1C
HEMOGLOBIN A1C: 6.8 % — AB (ref <5.7)
Mean Plasma Glucose: 148 mg/dL — ABNORMAL HIGH (ref <117)

## 2014-04-06 LAB — OSMOLALITY, URINE: Osmolality, Ur: 597 mOsm/kg (ref 390–1090)

## 2014-04-06 MED ORDER — PREDNISONE 20 MG PO TABS
20.0000 mg | ORAL_TABLET | Freq: Every day | ORAL | Status: DC
  Administered 2014-04-07: 20 mg via ORAL
  Filled 2014-04-06: qty 1

## 2014-04-06 NOTE — Care Management Note (Signed)
    Page 1 of 1   04/06/2014     11:48:16 AM CARE MANAGEMENT NOTE 04/06/2014  Patient:  [name], [name]   Account Number:  [id number]  Date Initiated:  04/06/2014  Documentation initiated by:  Dessa Phi  Subjective/Objective Assessment:   78 Y/O M ADMITTED W/PNA.     Action/Plan:   FROM  HOME.   Anticipated DC Date:  04/09/2014   Anticipated DC Plan:  [place]  CM consult      Choice offered to / List presented to:             Status of service:  In process, will continue to follow Medicare Important Message given?   (If response is "NO", the following Medicare IM given date fields will be blank) Date Medicare IM given:   Medicare IM given by:   Date Additional Medicare IM given:   Additional Medicare IM given by:    Discharge Disposition:    Per UR Regulation:  Reviewed for med. necessity/level of care/duration of stay  If discussed at [place] of Stay Meetings, dates discussed:    Comments:  04/06/14 Marshelle Bilger RN,BSN NCM [place].MONITOR FOR PROGRESS, & D/C NEEDS.

## 2014-04-06 NOTE — Progress Notes (Signed)
Patient seen and examined this morning, admitted earlier today with CAP in the setting of COPD. - continue IV antibiotics, already improved this morning, may be able to go home tomorrow.  James Murry M. Cruzita Lederer, MD [facility] [phone]

## 2014-04-06 NOTE — H&P (Signed)
[facility] Admission History and Physical       Bosco Paparella KDX:[id number] DOB: [birth year] DOA: 04/05/2014  Referring physician:  PCP: Marjorie Smolder, MD  Specialists:   Chief Complaint: SOB , Fevers Cough and Congestion  HPI: [name] is a 78 y.o. male with a history of COPD, DM2, Hyperlipidemia who presented to the [facility] ED with complaints of worsening symptoms of fevers cough and chest congestion x 1 week despite Azithromycin Rx.   He had been seen by his PCP 1 week ago and prescribed a prednisone taper, but he did not pick up the prescription.  He went to the [facility] a few days later due to worsening and was diagnosed with acute bronchitis and placed on Azithromycin.   He has 1 day left on his Azithromycin, and he reports that his symptoms have continued to worsen, and he has more SOB.   He reports having a non-productive cough, and chest congestion and tightness.  Last night, he became very diaphoretic.   He also reports that his blood sugars have been increasing , whereas his glucose levels are usually well controlled, and he take Metformin rx.      Review of Systems:  Constitutional: No Weight Loss, No Weight Gain, +Night Sweats, +Fevers, +Chills, Dizziness, Fatigue, or Generalized Weakness HEENT: No Headaches, Difficulty Swallowing,Tooth/Dental Problems,Sore Throat,  No Sneezing, Rhinitis, Ear Ache, Nasal Congestion, or Post Nasal Drip,  Cardio-vascular:  No Chest pain, Orthopnea, PND, Edema in Lower Extremities, Anasarca, Dizziness, Palpitations  Resp: No Dyspnea, No DOE,  +Non-Productive Cough, No Hemoptysis, No Wheezing.    GI: No Heartburn, Indigestion, Abdominal Pain, Nausea, Vomiting, Diarrhea, Hematemesis, Hematochezia, Melena, Change in Bowel Habits,  Loss of Appetite  GU: No Dysuria, Change in Color of Urine, No Urgency or Frequency, No Flank pain.  Musculoskeletal: No Joint Pain or Swelling, No Decreased Range of Motion, No Back Pain.  Neurologic: No  Syncope, No Seizures, Muscle Weakness, Paresthesia, Vision Disturbance or Loss, No Diplopia, No Vertigo, No Difficulty Walking,  Skin: No Rash or Lesions. Psych: No Change in Mood or Affect, No Depression or Anxiety, No Memory loss, No Confusion, or Hallucinations   Past Medical History  Diagnosis Date  . Asthma   . Bronchitis   . Diabetes mellitus   . Hypertension   . Skull fracture   . Iliac artery aneurysm   . Peripheral neuropathy   . Hypercholesterolemia   . Subdural hematoma   . Progressive gait disorder   . 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 COPD (chronic obstructive pulmonary disease)     History reviewed. No pertinent past surgical history.    Prior to Admission medications   Medication Sig Start Date End Date Taking? Authorizing Provider  amLODipine (NORVASC) 5 MG tablet Take 5 mg by mouth daily.   Yes Historical Provider, MD  Ascorbic Acid (VITAMIN C PO) Take 1 tablet by mouth daily.   Yes Historical Provider, MD  aspirin 81 MG tablet Take 81 mg by mouth daily.   Yes Historical Provider, MD  atorvastatin (LIPITOR) 10 MG tablet Take 10 mg by mouth daily.   Yes Historical Provider, MD  dutasteride (AVODART) 0.5 MG capsule Take 0.5 mg by mouth daily.   Yes Historical Provider, MD  Fluticasone-Salmeterol (ADVAIR) 250-50 MCG/DOSE AEPB Inhale 1 puff into the lungs every 12 (twelve) hours.   Yes Historical Provider, MD  lisinopril (PRINIVIL,ZESTRIL) 20 MG tablet Take 20 mg by mouth daily.   Yes Historical Provider, MD  metFORMIN (GLUCOPHAGE) 500  MG tablet Take 500 mg by mouth 2 (two) times daily with a meal.  08/27/13  Yes Historical Provider, MD  Multiple Vitamin (MULTIVITAMIN WITH MINERALS) TABS Take 1 tablet by mouth daily.   Yes Historical Provider, MD  Tamsulosin HCl (FLOMAX) 0.4 MG CAPS Take 0.4 mg by mouth daily.   Yes Historical Provider, MD      Allergies  Allergen Reactions  . Pneumovax 23 [Pneumococcal Vac Polyvalent]     Fever, sweats  . Prednisone Other  (See Comments)    Sweating with high dose  . Penicillins Rash  . Sulfa Antibiotics Rash     Social History:  reports that he has been smoking Cigarettes.  He has a 30 pack-year smoking history. He has never used smokeless tobacco. He reports that he drinks about 7 ounces of alcohol per week. He reports that he does not use illicit drugs.     Family History  Problem Relation Age of Onset  . Stroke Father        Physical Exam:  GEN:  Pleasant Well Nourished and Well Developed  78 y.o. Hispanic male examined and in no acute distress; cooperative with exam Filed Vitals:   04/05/14 1722 04/05/14 1838 04/05/14 2114 04/05/14 2139  BP: 134/69 118/63  134/62  Pulse: 103 92  96  Temp: 99.3 F (37.4 C) 98.7 F (37.1 C)  98.1 F (36.7 C)  TempSrc: Oral Oral  Oral  Resp: 20 19  20   Height:  5\' 10"  (1.778 m)    Weight:  87.091 kg (192 lb)    SpO2: 93% 95% 94% 95%   Blood pressure 134/62, pulse 96, temperature 98.1 F (36.7 C), temperature source Oral, resp. rate 20, height 5\' 10"  (1.778 m), weight 87.091 kg (192 lb), SpO2 95.00%. PSYCH: He is alert and oriented x4; does not appear anxious does not appear depressed; affect is normal HEENT: Normocephalic and Atraumatic, Mucous membranes pink; PERRLA; EOM intact; Fundi:  Benign;  No scleral icterus, Nares: Patent, Oropharynx: Clear, Fair Dentition,    Neck:  FROM, No Cervical Lymphadenopathy nor Thyromegaly or Carotid Bruit; No JVD; Breasts:: Not examined CHEST WALL: No tenderness CHEST: Normal respiration, clear to auscultation bilaterally HEART: Regular rate and rhythm; no murmurs rubs or gallops BACK: No kyphosis or scoliosis; No CVA tenderness ABDOMEN: Positive Bowel Sounds, Soft Non-Tender; No Masses, No Organomegaly. Rectal Exam: Not done EXTREMITIES: No Cyanosis, Clubbing, or Edema; No Ulcerations. Genitalia: not examined PULSES: 2+ and symmetric SKIN: Normal hydration no rash or ulceration CNS: Alert and Oriented x 4 , No  Focal Deficits  Vascular: pulses palpable throughout    Labs on Admission:  Basic Metabolic Panel:  Recent Labs Lab 04/05/14 1215  NA 131*  K 4.5  CL 92*  CO2 23  GLUCOSE 223*  BUN 16  CREATININE 0.50  CALCIUM 9.8   Liver Function Tests: No results found for this basename: AST, ALT, ALKPHOS, BILITOT, PROT, ALBUMIN,  in the last 168 hours No results found for this basename: LIPASE, AMYLASE,  in the last 168 hours No results found for this basename: AMMONIA,  in the last 168 hours CBC:  Recent Labs Lab 04/05/14 1215  WBC 8.6  NEUTROABS 6.4  HGB 12.3*  HCT 34.5*  MCV 95.8  PLT 218   Cardiac Enzymes:  Recent Labs Lab 04/05/14 1215  TROPONINI <0.30    BNP (last 3 results) No results found for this basename: PROBNP,  in the last 8760 hours CBG:  Recent Labs Lab  04/05/14 1833 04/05/14 2216  GLUCAP 239* 310*    Radiological Exams on Admission: Dg Chest 2 View  04/05/2014   CLINICAL DATA:  Shortness of breath  EXAM: CHEST  2 VIEW  COMPARISON:  April 02, 2014  FINDINGS: The heart size and mediastinal contours are stable. There patchy consolidation of the left mid lung and left lung base. Mild patchy consolidation of right lung base is noted. There is no pulmonary edema. There is no pleural effusion. The visualized skeletal structures are stable.  IMPRESSION: Pneumonia is in the left mid to lower lung and in the right lung base.   Electronically Signed   By: Abelardo Diesel M.D.   On: 04/05/2014 12:21     EKG: Independently reviewed. Sinus Tachycardia at rate of 101   Assessment/Plan:   78 y.o. male with   Principal Problem:      1.   CAP (community acquired pneumonia)    IV Levaquin 750mg  daily    DUONebs    O2 PRN    Monitor O2 Sats         2.   Chronic obstructive pulmonary disease with acute exacerbation    DUONebs    Monitor  O2 Sats    O2 PRN         3.   Hyponatremia    Send Urine OSM, and Urine Electrolytes    IVFs with NSS    Monitor  Sodium Trend         4.   Type II or unspecified type diabetes mellitus without mention of complication, not stated as uncontrolled    Hold Metformin RX    SSI Coverage PRN    Check HbA1c in AM         5.   Hyperlipidemia    Continue Atovastatin Rx         6.  DVT Prophylaxis    Lovenox   Code Status:      FULL CODE Family Communication:    Wife at Bedside Disposition Plan:     Inpatient  Time spent:  [place] [facility] Pager [phone]   If [place] Please Contact the Day Rounding Team MD for [facility]  If 7PM-7AM, Please Contact night-coverage  [email or web address] Password [facility] 04/06/2014, 12:53 AM

## 2014-04-07 ENCOUNTER — Inpatient Hospital Stay (HOSPITAL_COMMUNITY): Payer: Medicare Other

## 2014-04-07 LAB — GLUCOSE, CAPILLARY
Glucose-Capillary: 171 mg/dL — ABNORMAL HIGH (ref 70–99)
Glucose-Capillary: 209 mg/dL — ABNORMAL HIGH (ref 70–99)
Glucose-Capillary: 293 mg/dL — ABNORMAL HIGH (ref 70–99)

## 2014-04-07 MED ORDER — PREDNISONE 20 MG PO TABS: 40.0000 mg | ORAL_TABLET | Freq: Every day | ORAL | Status: DC

## 2014-04-07 MED ORDER — DOCUSATE SODIUM 100 MG PO CAPS
200.0000 mg | ORAL_CAPSULE | Freq: Two times a day (BID) | ORAL | Status: DC
  Administered 2014-04-07: 200 mg via ORAL
  Filled 2014-04-07 (×4): qty 2

## 2014-04-07 MED ORDER — METHYLPREDNISOLONE SODIUM SUCC 125 MG IJ SOLR
60.0000 mg | Freq: Two times a day (BID) | INTRAMUSCULAR | Status: DC
  Administered 2014-04-07: 1 mg via INTRAVENOUS
  Administered 2014-04-07: 60 mg via INTRAVENOUS
  Filled 2014-04-07 (×3): qty 0.96

## 2014-04-07 NOTE — Progress Notes (Signed)
PROGRESS NOTE  [name] MPN:[id number] DOB: Sep 23, 1935 DOA: 04/05/2014 PCP: Marjorie Smolder, MD  HPI: [name] is a 78 y.o. male with a history of COPD, DM2, Hyperlipidemia who presented to the [facility] ED with complaints of worsening symptoms of fevers cough and chest congestion x 1 week despite Azithromycin Rx  Subjective/ 24 H Interval events - felt better yesterday, but more dyspneic last night. On room air this morning, feeling worse than yesterday  Assessment/Plan: CAP (community acquired pneumonia)  - continue Levaquin  - DUONebs   Chronic obstructive pulmonary disease with acute exacerbation  DUONebs  - iv solumedrol   Type II or unspecified type diabetes mellitus without mention of complication, not stated as uncontrolled  - Hold Metformin RX  - SSI Coverage PRN  - A1C shows good control   Hyperlipidemia  Continue Atovastatin Rx   Diet: heart/carb Fluids: none DVT Prophylaxis: lovenox  Code Status: Full Family Communication: d/w wife bedside  Disposition Plan: inpatient, home when ready   Consultants:  None   Procedures:  None    Antibiotics Levofloxacin 9/22 >>  Studies  Filed Vitals:   04/07/14 0850 04/07/14 0901 04/07/14 0940 04/07/14 1017  BP: 123/57  118/63 115/58  Pulse:    79  Temp:    98 F (36.7 C)  TempSrc:    Oral  Resp:      Height:      Weight:      SpO2:  94%  95%    Intake/Output Summary (Last 24 hours) at 04/07/14 1245 Last data filed at 04/07/14 0700  Gross per 24 hour  Intake   2400 ml  Output      0 ml  Net   2400 ml   Filed Weights   04/05/14 1201 04/05/14 1838  Weight: 87.091 kg (192 lb) 87.091 kg (192 lb)    Exam:  General:  NAD  Cardiovascular: RRR  Respiratory: no wheezing, moves air well  Abdomen: soft, non tender  MSK: no edema  Data Reviewed: Basic Metabolic Panel:  Recent Labs Lab 04/05/14 1215 04/06/14 0327  NA 131* 133*  K 4.5 4.3  CL 92* 96  CO2 23 25  GLUCOSE 223* 282*   BUN 16 20  CREATININE 0.50 0.53  CALCIUM 9.8 9.2   CBC:  Recent Labs Lab 04/05/14 1215 04/06/14 0327  WBC 8.6 8.2  NEUTROABS 6.4  --   HGB 12.3* 11.4*  HCT 34.5* 31.6*  MCV 95.8 95.8  PLT 218 216   Cardiac Enzymes:  Recent Labs Lab 04/05/14 1215  TROPONINI <0.30   CBG:  Recent Labs Lab 04/06/14 1202 04/06/14 1708 04/06/14 2143 04/07/14 0745 04/07/14 1154  GLUCAP 225* 196* 229* 171* 209*    Recent Results (from the past 240 hour(s))  CULTURE, BLOOD (ROUTINE X 2)     Status: None   Collection Time    04/05/14  1:00 PM      Result Value Ref Range Status   Specimen Description BLOOD RIGHT AC   Final   Special Requests BOTTLES DRAWN AEROBIC AND ANAEROBIC [facility] EACH   Final   Culture  Setup Time     Final   Value: 04/05/2014 21:59     Performed at [facility]   Culture     Final   Value:        BLOOD CULTURE RECEIVED NO GROWTH TO DATE CULTURE WILL BE HELD FOR 5 DAYS BEFORE ISSUING A FINAL NEGATIVE REPORT     Performed  at [facility]   Report Status PENDING   Incomplete  CULTURE, BLOOD (ROUTINE X 2)     Status: None   Collection Time    04/05/14  1:20 PM      Result Value Ref Range Status   Specimen Description BLOOD LEFT AC   Final   Special Requests BOTTLES DRAWN AEROBIC AND ANAEROBIC [facility] EACH   Final   Culture  Setup Time     Final   Value: 04/05/2014 21:56     Performed at [facility]   Culture     Final   Value:        BLOOD CULTURE RECEIVED NO GROWTH TO DATE CULTURE WILL BE HELD FOR 5 DAYS BEFORE ISSUING A FINAL NEGATIVE REPORT     Performed at [facility]   Report Status PENDING   Incomplete     Studies: No results found.  Scheduled Meds: . amLODipine  5 mg Oral Daily  . aspirin  81 mg Oral Daily  . atorvastatin  10 mg Oral Daily  . dutasteride  0.5 mg Oral Daily  . enoxaparin (LOVENOX) injection  40 mg Subcutaneous Q24H  . insulin aspart  0-5 Units Subcutaneous QHS  . insulin aspart  0-9 Units Subcutaneous  TID WC  . ipratropium-albuterol  3 mL Nebulization TID  . levofloxacin (LEVAQUIN) IV  750 mg Intravenous Q24H  . lisinopril  20 mg Oral Daily  . methylPREDNISolone (SOLU-MEDROL) injection  60 mg Intravenous Q12H  . multivitamin with minerals  1 tablet Oral Daily  . tamsulosin  0.4 mg Oral Daily  . vitamin C  500 mg Oral Daily   Continuous Infusions:   Principal Problem:   CAP (community acquired pneumonia) Active Problems:   Type II or unspecified type diabetes mellitus without mention of complication, not stated as uncontrolled   Chronic obstructive pulmonary disease with acute exacerbation   Hyponatremia   Hyperlipidemia   Time spent: [place], MD [facility] Pager [phone]. If 7 PM - 7 AM, please contact night-coverage at [email or web address], password [facility] 04/07/2014, 12:45 PM  LOS: 2 days

## 2014-04-07 NOTE — Progress Notes (Signed)
Inpatient Diabetes Program Recommendations  AACE/ADA: New Consensus Statement on Inpatient Glycemic Control (2013)  Target Ranges:  Prepandial:   less than 140 mg/dL      Peak postprandial:   less than 180 mg/dL (1-2 hours)      Critically ill patients:  140 - 180 mg/dL    Inpatient Diabetes Program Recommendations Insulin - Basal: May want to add some basal lantus/levemir of 10-15 units daiy or HS while on Solumedrol. Correction (SSI): May need increase to moderate tidwc as well.  Thank you, Rosita Kea, RN, CNS, Diabetes Coordinator [phone])

## 2014-04-08 LAB — GLUCOSE, CAPILLARY
Glucose-Capillary: 293 mg/dL — ABNORMAL HIGH (ref 70–99)
Glucose-Capillary: 280 mg/dL — ABNORMAL HIGH (ref 70–99)
Glucose-Capillary: 251 mg/dL — ABNORMAL HIGH (ref 70–99)

## 2014-04-08 MED ORDER — GLIPIZIDE 5 MG PO TABS: 5.0000 mg | ORAL_TABLET | Freq: Every day | ORAL | Status: DC

## 2014-04-08 MED ORDER — PREDNISONE 20 MG PO TABS
30.0000 mg | ORAL_TABLET | Freq: Every day | ORAL | Status: DC
  Administered 2014-04-08: 30 mg via ORAL
  Filled 2014-04-08: qty 1

## 2014-04-08 MED ORDER — LEVOFLOXACIN 750 MG PO TABS: 750.0000 mg | ORAL_TABLET | Freq: Every day | ORAL | Status: DC

## 2014-04-08 MED ORDER — PREDNISONE 10 MG PO TABS: 30.0000 mg | ORAL_TABLET | Freq: Every day | ORAL | Status: DC

## 2014-04-08 MED ORDER — LORATADINE 10 MG PO TABS
10.0000 mg | ORAL_TABLET | Freq: Every day | ORAL | Status: DC | PRN
  Filled 2014-04-08: qty 1

## 2014-04-08 NOTE — Discharge Instructions (Signed)

## 2014-04-09 NOTE — Discharge Summary (Signed)
Physician Discharge Summary  [name] VOH:[id number] DOB: September 12, 1935 DOA: 04/05/2014  PCP: Marjorie Smolder, MD  Admit date: 04/05/2014 Discharge date: 04/09/2014  Time spent: 35 minutes  Recommendations for Outpatient Follow-up:  1. Follow up with PCP in 1 week   Discharge Diagnoses:  Principal Problem:   CAP (community acquired pneumonia) Active Problems:   Type II or unspecified type diabetes mellitus without mention of complication, not stated as uncontrolled   Chronic obstructive pulmonary disease with acute exacerbation   Hyponatremia   Hyperlipidemia  Discharge Condition: stable  Diet recommendation: diabetic  Filed Weights   04/05/14 1201 04/05/14 1838  Weight: 87.091 kg (192 lb) 87.091 kg (192 lb)   History of present illness:  [name] is a 78 y.o. male with a history of COPD, DM2, Hyperlipidemia who presented to the [facility] ED with complaints of worsening symptoms of fevers cough and chest congestion x 1 week despite Azithromycin Rx. He had been seen by his PCP 1 week ago and prescribed a prednisone taper, but he did not pick up the prescription. He went to the [facility] a few days later due to worsening and was diagnosed with acute bronchitis and placed on Azithromycin. He has 1 day left on his Azithromycin, and he reports that his symptoms have continued to worsen, and he has more SOB. He reports having a non-productive cough, and chest congestion and tightness. Last night, he became very diaphoretic. He also reports that his blood sugars have been increasing , whereas his glucose levels are usually well controlled, and he take Metformin rx.   Hospital Course:  CAP (community acquired pneumonia) - patient admitted to telemetry floor and IV Levofloxacin was started. He responded well and his antibiotic regimen was transitioned to po and he is to complete a full course as an outpatient. On the day of discharge, patient able to ambulate in the hallway without  oxygen maintaining his saturations > 90%, without shortness of breath or chest pain. He will follow up with his PCP in 1 week following discharge.  Chronic obstructive pulmonary disease with acute exacerbation - patient initially placed on IV steroids which were transitioned to po Prednisone and will have a short 5 day taper following discharge. Type II or unspecified type diabetes mellitus without mention of complication, not stated as uncontrolled. Patient's A1C was 6.8 showing good control with diet and Metformin. His sugars have been elevated in the mid 200s while on steroids. He was prescribed Glipizide for 5 days while he is on Prednisone and was instructed to check his CBGs in the morning and prior to lunch and dinner. He is also to resume his Metformin which he discontinued prior to admission.  Hyperlipidemia Continue statin Tobacco abuse - counseled for cessation  Procedures:  None   Consultations:  None   Discharge Exam: Filed Vitals:   04/08/14 1029 04/08/14 1343 04/08/14 1404 04/08/14 1419  BP:  127/72    Pulse:  74    Temp:  97.9 F (36.6 C)    TempSrc:  Oral    Resp:  18    Height:      Weight:      SpO2: 96% 97% 96% 94%   General: NAD Cardiovascular: RRR Respiratory: CTA biL, no wheezing  Discharge Instructions    Medication List         amLODipine 5 MG tablet  Commonly known as:  NORVASC  Take 5 mg by mouth daily.     aspirin 81 MG tablet  Take 81 mg by mouth daily.     atorvastatin 10 MG tablet  Commonly known as:  LIPITOR  Take 10 mg by mouth daily.     dutasteride 0.5 MG capsule  Commonly known as:  AVODART  Take 0.5 mg by mouth daily.     Fluticasone-Salmeterol 250-50 MCG/DOSE Aepb  Commonly known as:  ADVAIR  Inhale 1 puff into the lungs every 12 (twelve) hours.     glipiZIDE 5 MG tablet  Commonly known as:  GLUCOTROL  Take 1 tablet (5 mg total) by mouth daily before breakfast.     levofloxacin 750 MG tablet  Commonly known as:   LEVAQUIN  Take 1 tablet (750 mg total) by mouth daily.     lisinopril 20 MG tablet  Commonly known as:  PRINIVIL,ZESTRIL  Take 20 mg by mouth daily.     metFORMIN 500 MG tablet  Commonly known as:  GLUCOPHAGE  Take 500 mg by mouth 2 (two) times daily with a meal.     multivitamin with minerals Tabs tablet  Take 1 tablet by mouth daily.     predniSONE 10 MG tablet  Commonly known as:  DELTASONE  Take 3 tablets (30 mg total) by mouth daily with breakfast. 2 tablets daily for 3 days then 1 tablet daily for 2 days then stop.     tamsulosin 0.4 MG Caps capsule  Commonly known as:  FLOMAX  Take 0.4 mg by mouth daily.     VITAMIN C PO  Take 1 tablet by mouth daily.           Follow-up Information   Follow up with Marjorie Smolder, MD. Schedule an appointment as soon as possible for a visit in 1 week.   Specialty:  Family Medicine   Contact information:   [address] [place] [address] [phone]       The results of significant diagnostics from this hospitalization (including imaging, microbiology, ancillary and laboratory) are listed below for reference.    Significant Diagnostic Studies: Dg Chest 2 View  04/07/2014   CLINICAL DATA:  Cough, worsening shortness of Breath  EXAM: CHEST  2 VIEW  COMPARISON:  04/05/2014  FINDINGS: Cardiomediastinal silhouette is stable. Worsening patchy pneumonia in left lower lobe. Probable small left pleural effusion. Stable atelectasis or infiltrate in right middle lobe.  IMPRESSION: Worsening patchy pneumonia in left lower lobe. Probable small left pleural effusion.   Electronically Signed   By: Lahoma Crocker M.D.   On: 04/07/2014 10:53   Dg Chest 2 View  04/05/2014   CLINICAL DATA:  Shortness of breath  EXAM: CHEST  2 VIEW  COMPARISON:  April 02, 2014  FINDINGS: The heart size and mediastinal contours are stable. There patchy consolidation of the left mid lung and left lung base. Mild patchy consolidation of right  lung base is noted. There is no pulmonary edema. There is no pleural effusion. The visualized skeletal structures are stable.  IMPRESSION: Pneumonia is in the left mid to lower lung and in the right lung base.   Electronically Signed   By: Abelardo Diesel M.D.   On: 04/05/2014 12:21   Ct Angio Abd/pel W/ And/or W/o  03/29/2014   CLINICAL DATA:  Followup celiac and iliac artery aneurysms. No acute complaints.  EXAM: CTA ABDOMEN AND PELVIS wITHOUT AND WITH CONTRAST  TECHNIQUE: Multidetector CT imaging of the abdomen and pelvis was performed using the standard protocol during bolus administration of intravenous contrast. Multiplanar reconstructed  images and MIPs were obtained and reviewed to evaluate the vascular anatomy.  CONTRAST:  39mL OMNIPAQUE IOHEXOL 350 MG/ML SOLN  COMPARISON:  03/23/2013  FINDINGS: Stable 6 mm right lower lobe pulmonary nodule (image 1/series 5) unchanged compared with 03/04/2012.  The liver demonstrates no focal abnormality. There is no intrahepatic or extrahepatic biliary ductal dilatation. There is cholelithiasis. The spleen demonstrates no focal abnormality. The kidneys, adrenal glands and pancreas are normal. The bladder is unremarkable. There is mild prostatomegaly.  The stomach, duodenum, small intestine, and large intestine demonstrate no gross abnormality. There is no pneumoperitoneum, pneumatosis, or portal venous gas. There is no abdominal or pelvic free fluid. There is no lymphadenopathy.  There are no lytic or sclerotic osseous lesions. Degenerative disc disease of the lumbar spine most severe at L4-5 with disc space narrowing and discogenic endplate osteophytes. Bilateral facet arthropathy at L4-5 and L5-S1.  Aorta: The abdominal aorta is patent and mildly tortuous. Maximal diameter of the infrarenal abdominal roughly measures 2.6 cm .  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 The aneurysm measures 1.4 cm in  diameter and is unchanged. The aneurysm terminates at the trifurcation. The aneurysm is morphologically unchanged with partial calcification and irregularity.  Superior mesenteric: Patent without aneurysm. Mild atherosclerotic calcification at the origin.  Left renal: Mild atherosclerotic plaque near the origin without critical stenosis or aneurysm.  Right renal: Mild atherosclerotic plaque near the origin without critical stenosis or aneurysm.  Inferior mesenteric:  Patent.  Left iliac: Dilatation of the distal left common iliac artery, measuring up to 2.3 cm and unchanged. The left internal and external iliac arteries are patent.  Right iliac: Fusiform dilatation of the right common iliac artery measuring up to 2.6 cm and is stable. The right internal and external iliac arteries are patent. The external iliac artery measures up to 1.1 cm.  Review of the MIP images confirms the above findings.  IMPRESSION: 1. 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 2. 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   Electronically Signed   By: Kathreen Devoid   On: 03/29/2014 13:09    Microbiology: Recent Results (from the past 240 hour(s))  CULTURE, BLOOD (ROUTINE X 2)     Status: None   Collection Time    04/05/14  1:00 PM      Result Value Ref Range Status   Specimen Description BLOOD RIGHT [facility]   Final   Special Requests BOTTLES DRAWN AEROBIC AND ANAEROBIC [facility] EACH   Final   Culture  Setup Time     Final   Value: 04/05/2014 21:59     Performed at [facility]   Culture     Final   Value:        BLOOD CULTURE RECEIVED NO GROWTH TO DATE CULTURE WILL BE HELD FOR 5 DAYS BEFORE ISSUING A FINAL NEGATIVE REPORT     Performed at [facility]   Report Status PENDING   Incomplete  CULTURE, BLOOD (ROUTINE X 2)     Status: None   Collection Time    04/05/14  1:20 PM      Result Value Ref Range Status   Specimen Description BLOOD LEFT AC   Final   Special  Requests BOTTLES DRAWN AEROBIC AND ANAEROBIC [place]   Final   Culture  Setup Time     Final   Value: 04/05/2014 21:56  Performed at [facility]     Final   Value:        BLOOD CULTURE RECEIVED NO GROWTH TO DATE CULTURE WILL BE HELD FOR 5 DAYS BEFORE ISSUING A FINAL NEGATIVE REPORT     Performed at [facility]   Report Status PENDING   Incomplete     Labs: Basic Metabolic Panel:  Recent Labs Lab 04/05/14 1215 04/06/14 0327  NA 131* 133*  K 4.5 4.3  CL 92* 96  CO2 23 25  GLUCOSE 223* 282*  BUN 16 20  CREATININE 0.50 0.53  CALCIUM 9.8 9.2   CBC:  Recent Labs Lab 04/05/14 1215 04/06/14 0327  WBC 8.6 8.2  NEUTROABS 6.4  --   HGB 12.3* 11.4*  HCT 34.5* 31.6*  MCV 95.8 95.8  PLT 218 216   Cardiac Enzymes:  Recent Labs Lab 04/05/14 1215  TROPONINI <0.30   CBG:  Recent Labs Lab 04/07/14 1154 04/07/14 1707 04/07/14 2155 04/08/14 0714 04/08/14 1253  GLUCAP 209* 293* 293* 280* 251*       Signed:  GHERGHE, COSTIN  [facility] 04/09/2014, 11:14 AM

## 2014-04-11 LAB — CULTURE, BLOOD (ROUTINE X 2)
CULTURE: NO GROWTH
Culture: NO GROWTH

## 2014-08-25 ENCOUNTER — Other Ambulatory Visit: Payer: Self-pay | Admitting: Family Medicine

## 2014-08-25 DIAGNOSIS — R911 Solitary pulmonary nodule: Secondary | ICD-10-CM

## 2014-08-26 ENCOUNTER — Ambulatory Visit: Payer: Medicare Other | Admitting: Interventional Cardiology

## 2014-08-26 DIAGNOSIS — R0789 Other chest pain: Secondary | ICD-10-CM | POA: Insufficient documentation

## 2014-09-07 ENCOUNTER — Inpatient Hospital Stay (HOSPITAL_BASED_OUTPATIENT_CLINIC_OR_DEPARTMENT_OTHER): Payer: PPO

## 2014-09-07 ENCOUNTER — Emergency Department (HOSPITAL_BASED_OUTPATIENT_CLINIC_OR_DEPARTMENT_OTHER): Payer: PPO

## 2014-09-07 ENCOUNTER — Inpatient Hospital Stay (HOSPITAL_BASED_OUTPATIENT_CLINIC_OR_DEPARTMENT_OTHER)
Admission: EM | Admit: 2014-09-07 | Discharge: 2014-09-09 | DRG: 194 | Disposition: A | Discharge status: Home / Self Care | Payer: PPO | Attending: Internal Medicine | Admitting: Internal Medicine

## 2014-09-07 ENCOUNTER — Encounter (HOSPITAL_BASED_OUTPATIENT_CLINIC_OR_DEPARTMENT_OTHER): Payer: Self-pay | Admitting: *Deleted

## 2014-09-07 DIAGNOSIS — J449 Chronic obstructive pulmonary disease, unspecified: Secondary | ICD-10-CM | POA: Diagnosis present

## 2014-09-07 DIAGNOSIS — Z87891 Personal history of nicotine dependence: Secondary | ICD-10-CM | POA: Diagnosis not present

## 2014-09-07 DIAGNOSIS — E871 Hypo-osmolality and hyponatremia: Secondary | ICD-10-CM | POA: Diagnosis present

## 2014-09-07 DIAGNOSIS — D72829 Elevated white blood cell count, unspecified: Secondary | ICD-10-CM

## 2014-09-07 DIAGNOSIS — E780 Pure hypercholesterolemia: Secondary | ICD-10-CM | POA: Diagnosis present

## 2014-09-07 DIAGNOSIS — E785 Hyperlipidemia, unspecified: Secondary | ICD-10-CM | POA: Diagnosis present

## 2014-09-07 DIAGNOSIS — G44229 Chronic tension-type headache, not intractable: Secondary | ICD-10-CM | POA: Diagnosis present

## 2014-09-07 DIAGNOSIS — J45909 Unspecified asthma, uncomplicated: Secondary | ICD-10-CM | POA: Diagnosis present

## 2014-09-07 DIAGNOSIS — Z880 Allergy status to penicillin: Secondary | ICD-10-CM | POA: Diagnosis not present

## 2014-09-07 DIAGNOSIS — J189 Pneumonia, unspecified organism: Principal | ICD-10-CM | POA: Diagnosis present

## 2014-09-07 DIAGNOSIS — I10 Essential (primary) hypertension: Secondary | ICD-10-CM | POA: Diagnosis present

## 2014-09-07 DIAGNOSIS — Z7982 Long term (current) use of aspirin: Secondary | ICD-10-CM

## 2014-09-07 DIAGNOSIS — Z885 Allergy status to narcotic agent status: Secondary | ICD-10-CM

## 2014-09-07 DIAGNOSIS — G629 Polyneuropathy, unspecified: Secondary | ICD-10-CM | POA: Diagnosis present

## 2014-09-07 DIAGNOSIS — E118 Type 2 diabetes mellitus with unspecified complications: Secondary | ICD-10-CM | POA: Diagnosis present

## 2014-09-07 DIAGNOSIS — R0602 Shortness of breath: Secondary | ICD-10-CM | POA: Diagnosis present

## 2014-09-07 DIAGNOSIS — D638 Anemia in other chronic diseases classified elsewhere: Secondary | ICD-10-CM | POA: Diagnosis present

## 2014-09-07 DIAGNOSIS — D649 Anemia, unspecified: Secondary | ICD-10-CM

## 2014-09-07 LAB — COMPREHENSIVE METABOLIC PANEL
ALBUMIN: 3.6 g/dL (ref 3.5–5.2)
ALK PHOS: 74 U/L (ref 39–117)
ALT: 65 U/L — ABNORMAL HIGH (ref 0–53)
ANION GAP: 9 (ref 5–15)
AST: 29 U/L (ref 0–37)
BUN: 19 mg/dL (ref 6–23)
CALCIUM: 8.8 mg/dL (ref 8.4–10.5)
CO2: 24 mmol/L (ref 19–32)
Chloride: 91 mmol/L — ABNORMAL LOW (ref 96–112)
Creatinine, Ser: 0.61 mg/dL (ref 0.50–1.35)
GFR calc non Af Amer: >90 mL/min (ref >90)
Glucose, Bld: 262 mg/dL — ABNORMAL HIGH (ref 70–99)
POTASSIUM: 4.3 mmol/L (ref 3.5–5.1)
Sodium: 124 mmol/L — ABNORMAL LOW (ref 135–145)
Total Bilirubin: 0.3 mg/dL (ref 0.3–1.2)
Total Protein: 8.1 g/dL (ref 6.0–8.3)

## 2014-09-07 LAB — CBC WITH DIFFERENTIAL/PLATELET
BASOS ABS: 0 10*3/uL (ref 0.0–0.1)
BASOS PCT: 0 % (ref 0–1)
EOS ABS: 0 10*3/uL (ref 0.0–0.7)
Eosinophils Relative: 0 % (ref 0–5)
HCT: 34.2 % — ABNORMAL LOW (ref 39.0–52.0)
HEMOGLOBIN: 11.8 g/dL — AB (ref 13.0–17.0)
Lymphocytes Relative: 13 % (ref 12–46)
Lymphs Abs: 1.5 10*3/uL (ref 0.7–4.0)
MCH: 30.7 pg (ref 26.0–34.0)
MCHC: 34.5 g/dL (ref 30.0–36.0)
MCV: 89.1 fL (ref 78.0–100.0)
Monocytes Absolute: 0.8 10*3/uL (ref 0.1–1.0)
Monocytes Relative: 7 % (ref 3–12)
NEUTROS ABS: 8.8 10*3/uL — AB (ref 1.7–7.7)
NEUTROS PCT: 80 % — AB (ref 43–77)
Platelets: 434 10*3/uL — ABNORMAL HIGH (ref 150–400)
RBC: 3.84 MIL/uL — AB (ref 4.22–5.81)
RDW: 13.3 % (ref 11.5–15.5)
WBC: 11.2 10*3/uL — ABNORMAL HIGH (ref 4.0–10.5)

## 2014-09-07 LAB — GLUCOSE, CAPILLARY
GLUCOSE-CAPILLARY: 177 mg/dL — AB (ref 70–99)
Glucose-Capillary: 320 mg/dL — ABNORMAL HIGH (ref 70–99)

## 2014-09-07 LAB — STREP PNEUMONIAE URINARY ANTIGEN: Strep Pneumo Urinary Antigen: NEGATIVE

## 2014-09-07 LAB — RETICULOCYTES
RBC.: 3.88 MIL/uL — ABNORMAL LOW (ref 4.22–5.81)
Retic Count, Absolute: 77.6 10*3/uL (ref 19.0–186.0)
Retic Ct Pct: 2 % (ref 0.4–3.1)

## 2014-09-07 LAB — BRAIN NATRIURETIC PEPTIDE: B Natriuretic Peptide: 18.9 pg/mL (ref 0.0–100.0)

## 2014-09-07 LAB — TROPONIN I

## 2014-09-07 LAB — TSH: TSH: 1.303 u[IU]/mL (ref 0.350–4.500)

## 2014-09-07 MED ORDER — TAMSULOSIN HCL 0.4 MG PO CAPS
0.4000 mg | ORAL_CAPSULE | Freq: Every day | ORAL | Status: DC
  Administered 2014-09-08 – 2014-09-09 (×2): 0.4 mg via ORAL
  Filled 2014-09-07 (×2): qty 1

## 2014-09-07 MED ORDER — IPRATROPIUM-ALBUTEROL 0.5-2.5 (3) MG/3ML IN SOLN
3.0000 mL | RESPIRATORY_TRACT | Status: DC
  Administered 2014-09-07 – 2014-09-09 (×9): 3 mL via RESPIRATORY_TRACT
  Filled 2014-09-07 (×9): qty 3

## 2014-09-07 MED ORDER — SODIUM CHLORIDE 0.9 % IV SOLN
INTRAVENOUS | Status: DC
  Administered 2014-09-07: 100 mL/h via INTRAVENOUS
  Administered 2014-09-09: 06:00:00 via INTRAVENOUS

## 2014-09-07 MED ORDER — MOMETASONE FURO-FORMOTEROL FUM 100-5 MCG/ACT IN AERO
2.0000 | INHALATION_SPRAY | Freq: Two times a day (BID) | RESPIRATORY_TRACT | Status: DC
  Administered 2014-09-07 – 2014-09-09 (×4): 2 via RESPIRATORY_TRACT
  Filled 2014-09-07: qty 8.8

## 2014-09-07 MED ORDER — ASPIRIN EC 81 MG PO TBEC
81.0000 mg | DELAYED_RELEASE_TABLET | Freq: Every day | ORAL | Status: DC
Start: 2014-09-07 — End: 2014-09-09
  Administered 2014-09-07 – 2014-09-09 (×3): 81 mg via ORAL
  Filled 2014-09-07 (×3): qty 1

## 2014-09-07 MED ORDER — CEFTRIAXONE SODIUM 1 G IJ SOLR
1.0000 g | Freq: Once | INTRAMUSCULAR | Status: AC
  Administered 2014-09-07: 1 g via INTRAVENOUS

## 2014-09-07 MED ORDER — CYCLOBENZAPRINE HCL 10 MG PO TABS: 10.0000 mg | ORAL_TABLET | Freq: Three times a day (TID) | ORAL | Status: DC | PRN

## 2014-09-07 MED ORDER — AMLODIPINE BESYLATE 5 MG PO TABS
5.0000 mg | ORAL_TABLET | Freq: Every day | ORAL | Status: DC
  Administered 2014-09-07 – 2014-09-09 (×3): 5 mg via ORAL
  Filled 2014-09-07 (×3): qty 1

## 2014-09-07 MED ORDER — CEFTRIAXONE SODIUM 1 G IJ SOLR
INTRAMUSCULAR | Status: AC
  Filled 2014-09-07: qty 10

## 2014-09-07 MED ORDER — LEVOFLOXACIN IN D5W 750 MG/150ML IV SOLN
750.0000 mg | INTRAVENOUS | Status: DC
  Administered 2014-09-07 – 2014-09-08 (×2): 750 mg via INTRAVENOUS
  Filled 2014-09-07 (×2): qty 150

## 2014-09-07 MED ORDER — FINASTERIDE 5 MG PO TABS
5.0000 mg | ORAL_TABLET | Freq: Every day | ORAL | Status: DC
  Administered 2014-09-08 – 2014-09-09 (×2): 5 mg via ORAL
  Filled 2014-09-07 (×2): qty 1

## 2014-09-07 MED ORDER — ADULT MULTIVITAMIN W/MINERALS CH
1.0000 | ORAL_TABLET | Freq: Every day | ORAL | Status: DC
  Administered 2014-09-08 – 2014-09-09 (×2): 1 via ORAL
  Filled 2014-09-07 (×2): qty 1

## 2014-09-07 MED ORDER — INSULIN ASPART 100 UNIT/ML ~~LOC~~ SOLN
0.0000 [IU] | Freq: Three times a day (TID) | SUBCUTANEOUS | Status: DC
  Administered 2014-09-08: 5 [IU] via SUBCUTANEOUS
  Administered 2014-09-08: 8 [IU] via SUBCUTANEOUS
  Administered 2014-09-08: 3 [IU] via SUBCUTANEOUS
  Administered 2014-09-09: 5 [IU] via SUBCUTANEOUS

## 2014-09-07 MED ORDER — IPRATROPIUM-ALBUTEROL 0.5-2.5 (3) MG/3ML IN SOLN
3.0000 mL | Freq: Once | RESPIRATORY_TRACT | Status: AC
  Administered 2014-09-07: 3 mL via RESPIRATORY_TRACT
  Filled 2014-09-07: qty 3

## 2014-09-07 MED ORDER — IOHEXOL 300 MG/ML  SOLN
80.0000 mL | Freq: Once | INTRAMUSCULAR | Status: AC | PRN
  Administered 2014-09-07: 80 mL via INTRAVENOUS

## 2014-09-07 MED ORDER — ATORVASTATIN CALCIUM 10 MG PO TABS
10.0000 mg | ORAL_TABLET | Freq: Every day | ORAL | Status: DC
  Administered 2014-09-08 – 2014-09-09 (×2): 10 mg via ORAL
  Filled 2014-09-07 (×2): qty 1

## 2014-09-07 MED ORDER — AZITHROMYCIN 250 MG PO TABS
500.0000 mg | ORAL_TABLET | Freq: Once | ORAL | Status: AC
  Administered 2014-09-07: 500 mg via ORAL
  Filled 2014-09-07: qty 2

## 2014-09-07 MED ORDER — DM-GUAIFENESIN ER 30-600 MG PO TB12
2.0000 | ORAL_TABLET | Freq: Two times a day (BID) | ORAL | Status: DC
  Administered 2014-09-07 – 2014-09-09 (×4): 2 via ORAL
  Filled 2014-09-07 (×4): qty 2

## 2014-09-07 MED ORDER — ONDANSETRON HCL 4 MG/2ML IJ SOLN: 4.0000 mg | Freq: Four times a day (QID) | INTRAMUSCULAR | Status: DC | PRN

## 2014-09-07 MED ORDER — ACETAMINOPHEN 325 MG PO TABS
650.0000 mg | ORAL_TABLET | ORAL | Status: DC | PRN
  Administered 2014-09-07 – 2014-09-09 (×5): 650 mg via ORAL
  Filled 2014-09-07 (×5): qty 2

## 2014-09-07 MED ORDER — LISINOPRIL 20 MG PO TABS
20.0000 mg | ORAL_TABLET | Freq: Every day | ORAL | Status: DC
  Administered 2014-09-08 – 2014-09-09 (×2): 20 mg via ORAL
  Filled 2014-09-07 (×2): qty 1

## 2014-09-07 MED ORDER — PANTOPRAZOLE SODIUM 40 MG PO TBEC
40.0000 mg | DELAYED_RELEASE_TABLET | Freq: Every day | ORAL | Status: DC
  Administered 2014-09-08 – 2014-09-09 (×2): 40 mg via ORAL
  Filled 2014-09-07 (×2): qty 1

## 2014-09-07 MED ORDER — DUTASTERIDE 0.5 MG PO CAPS: 0.5000 mg | ORAL_CAPSULE | Freq: Every day | ORAL | Status: DC

## 2014-09-07 MED ORDER — SODIUM CHLORIDE 0.9 % IV BOLUS (SEPSIS)
500.0000 mL | Freq: Once | INTRAVENOUS | Status: AC
  Administered 2014-09-07: 500 mL via INTRAVENOUS

## 2014-09-07 NOTE — ED Notes (Signed)
Patient transported to radiology

## 2014-09-07 NOTE — ED Notes (Signed)
Pt sats 92% on RA. Pt placed on O2 at 2l/m [place] for comfort.

## 2014-09-07 NOTE — ED Notes (Signed)
Attempt to call report no answer

## 2014-09-07 NOTE — ED Notes (Signed)
Saw his PCP with cough appx two weeks ago, was given prednisone. Sates it has been getting worse over the last few days.

## 2014-09-07 NOTE — ED Provider Notes (Signed)
CSN: [id number]     Arrival date & time 09/07/14  1259 History   First MD Initiated Contact with Patient 09/07/14 1316     Chief Complaint  Patient presents with  . Shortness of Breath     Patient is a 79 y.o. male presenting with shortness of breath. The history is provided by the patient. No language interpreter was used.  Shortness of Breath  [name] presents for evaluation of shortness of breath and cough. Reports that he developed shortness of breath with cough about 2 weeks ago. He was seen by his PCP and started on steroids for possible bronchitis. He temporarily felt better but has since had worsening symptoms. He has temperatures to 100-100.4 at night. His shortness of breath and cough are worse at night and often wake him up. He endorses some DOE.  He is having night sweats. His cough is at times productive of white sputum. He denies any chest pain, vomiting, abdominal pain, leg swelling or pain.  No hemoptysis.  He has a hx/o DM, HTN, asthma.  Sxs are moderate, constant, worsening.    Past Medical History  Diagnosis Date  . Asthma   . Bronchitis   . Diabetes mellitus   . Hypertension   . Skull fracture   . Iliac artery aneurysm   . Peripheral neuropathy   . Hypercholesterolemia   . Subdural hematoma   . Progressive gait disorder   . 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 COPD (chronic obstructive pulmonary disease)    History reviewed. No pertinent past surgical history. Family History  Problem Relation Age of Onset  . Stroke Father    History  Substance Use Topics  . Smoking status: Former Smoker -- 0.50 packs/day for 60 years    Types: Cigarettes    Quit date: 04/07/2014  . Smokeless tobacco: Never Used  . Alcohol Use: 7.0 oz/week    14 Standard drinks or equivalent per week     Comment: 1-2 before dinner    Review of Systems  Respiratory: Positive for shortness of breath.   All other systems reviewed and are negative.     Allergies  Pneumovax 23;  Prednisone; Penicillins; and Sulfa antibiotics  Home Medications   Prior to Admission medications   Medication Sig Start Date End Date Taking? Authorizing Provider  amLODipine (NORVASC) 5 MG tablet Take 5 mg by mouth daily.    Historical Provider, MD  Ascorbic Acid (VITAMIN C PO) Take 1 tablet by mouth daily.    Historical Provider, MD  aspirin 81 MG tablet Take 81 mg by mouth daily.    Historical Provider, MD  atorvastatin (LIPITOR) 10 MG tablet Take 10 mg by mouth daily.    Historical Provider, MD  dutasteride (AVODART) 0.5 MG capsule Take 0.5 mg by mouth daily.    Historical Provider, MD  Fluticasone-Salmeterol (ADVAIR) 250-50 MCG/DOSE AEPB Inhale 1 puff into the lungs every 12 (twelve) hours.    Historical Provider, MD  glipiZIDE (GLUCOTROL) 5 MG tablet Take 1 tablet (5 mg total) by mouth daily before breakfast. 04/08/14   Caren Griffins, MD  levofloxacin (LEVAQUIN) 750 MG tablet Take 1 tablet (750 mg total) by mouth daily. 04/08/14   Costin Karlyne Greenspan, MD  lisinopril (PRINIVIL,ZESTRIL) 20 MG tablet Take 20 mg by mouth daily.    Historical Provider, MD  metFORMIN (GLUCOPHAGE) 500 MG tablet Take 500 mg by mouth 2 (two) times daily with a meal.  08/27/13   Historical Provider, MD  Multiple Vitamin (  MULTIVITAMIN WITH MINERALS) TABS Take 1 tablet by mouth daily.    Historical Provider, MD  predniSONE (DELTASONE) 10 MG tablet Take 3 tablets (30 mg total) by mouth daily with breakfast. 2 tablets daily for 3 days then 1 tablet daily for 2 days then stop. 04/08/14   Costin Karlyne Greenspan, MD  Tamsulosin HCl (FLOMAX) 0.4 MG CAPS Take 0.4 mg by mouth daily.    Historical Provider, MD   BP 142/79 mmHg  Pulse 100  Temp(Src) 98.3 F (36.8 C) (Oral)  Resp 18  Ht 5\' 10"  (1.778 m)  Wt 196 lb (88.905 kg)  BMI 28.12 kg/m2  SpO2 93% Physical Exam  Constitutional: He is oriented to person, place, and time. He appears well-developed and well-nourished.  HENT:  Head: Normocephalic and atraumatic.   Cardiovascular: Normal rate and regular rhythm.   No murmur heard. Pulmonary/Chest: Effort normal. No respiratory distress.  Decreased air movement in bilateral bases, wheezes in bilateral upper lung fields.   Abdominal: Soft. There is no tenderness. There is no rebound and no guarding.  Musculoskeletal: He exhibits no edema or tenderness.  Neurological: He is alert and oriented to person, place, and time.  Skin: Skin is warm and dry.  Psychiatric: He has a normal mood and affect. His behavior is normal.  Nursing note and vitals reviewed.   ED Course  Procedures (including critical care time) Labs Review Labs Reviewed  COMPREHENSIVE METABOLIC PANEL - Abnormal; Notable for the following:    Sodium 124 (*)    Chloride 91 (*)    Glucose, Bld 262 (*)    ALT 65 (*)    All other components within normal limits  CBC WITH DIFFERENTIAL/PLATELET - Abnormal; Notable for the following:    WBC 11.2 (*)    RBC 3.84 (*)    Hemoglobin 11.8 (*)    HCT 34.2 (*)    Platelets 434 (*)    Neutrophils Relative % 80 (*)    Neutro Abs 8.8 (*)    All other components within normal limits  HEMOGLOBIN A1C - Abnormal; Notable for the following:    Hgb A1c MFr Bld 8.5 (*)    All other components within normal limits  GLUCOSE, CAPILLARY - Abnormal; Notable for the following:    Glucose-Capillary 177 (*)    All other components within normal limits  BASIC METABOLIC PANEL - Abnormal; Notable for the following:    Sodium 126 (*)    Chloride 94 (*)    Glucose, Bld 249 (*)    All other components within normal limits  CBC WITH DIFFERENTIAL/PLATELET - Abnormal; Notable for the following:    WBC 13.2 (*)    RBC 3.69 (*)    Hemoglobin 11.3 (*)    HCT 33.3 (*)    Neutrophils Relative % 83 (*)    Neutro Abs 10.9 (*)    Lymphocytes Relative 10 (*)    All other components within normal limits  VITAMIN B12 - Abnormal; Notable for the following:    Vitamin B-12 1352 (*)    All other components within  normal limits  IRON AND TIBC - Abnormal; Notable for the following:    Iron 21 (*)    Saturation Ratios 7 (*)    All other components within normal limits  RETICULOCYTES - Abnormal; Notable for the following:    RBC. 3.88 (*)    All other components within normal limits  GLUCOSE, CAPILLARY - Abnormal; Notable for the following:    Glucose-Capillary 320 (*)  All other components within normal limits  GLUCOSE, CAPILLARY - Abnormal; Notable for the following:    Glucose-Capillary 224 (*)    All other components within normal limits  GLUCOSE, CAPILLARY - Abnormal; Notable for the following:    Glucose-Capillary 184 (*)    All other components within normal limits  GLUCOSE, CAPILLARY - Abnormal; Notable for the following:    Glucose-Capillary 294 (*)    All other components within normal limits  CBC - Abnormal; Notable for the following:    RBC 3.65 (*)    Hemoglobin 11.3 (*)    HCT 33.2 (*)    All other components within normal limits  BASIC METABOLIC PANEL - Abnormal; Notable for the following:    Sodium 131 (*)    Glucose, Bld 226 (*)    All other components within normal limits  GLUCOSE, CAPILLARY - Abnormal; Notable for the following:    Glucose-Capillary 242 (*)    All other components within normal limits  GLUCOSE, CAPILLARY - Abnormal; Notable for the following:    Glucose-Capillary 247 (*)    All other components within normal limits  CULTURE, BLOOD (ROUTINE X 2)  CULTURE, BLOOD (ROUTINE X 2)  CULTURE, EXPECTORATED SPUTUM-ASSESSMENT  CULTURE, RESPIRATORY (NON-EXPECTORATED)  GRAM STAIN  BRAIN NATRIURETIC PEPTIDE  TROPONIN I  HIV ANTIBODY (ROUTINE TESTING)  LEGIONELLA ANTIGEN, URINE  STREP PNEUMONIAE URINARY ANTIGEN  MAGNESIUM  SODIUM, URINE, RANDOM  CREATININE, URINE, RANDOM  OSMOLALITY  OSMOLALITY, URINE  CORTISOL  TSH  FOLATE  FERRITIN    Imaging Review No results found.   EKG Interpretation   Date/Time:  Wednesday September 07 2014 13:58:31  EST Ventricular Rate:  97 PR Interval:  126 QRS Duration: 86 QT Interval:  346 QTC Calculation: 439 R Axis:   58 Text Interpretation:  Normal sinus rhythm Normal ECG Confirmed by Hazle Coca [id number]) on 09/07/2014 2:17:07 PM      MDM   Final diagnoses:  CAP (community acquired pneumonia)  Hyponatremia    Patient here with progressive shortness of breath and cough, fevers at night. Chest x-ray concerning for pneumonia. Plan to admit for IV antibiotics and further management.  Quintella Reichert, MD 09/12/14 [phone]

## 2014-09-07 NOTE — H&P (Signed)
[facility] History and Physical  [name] DGU:[id number] DOB: Jun 02, 1936 DOA: 09/07/2014  Referring physician: Dr [name] PCP: James Smolder, MD   Chief Complaint: SOB/cough  HPI: [name] is a 79 y.o. male  With history of COPD, asthma, HTN, DM, hyperlipidemia, who presents to ED with a 3 week history of worsening, congestion, SOB, productive cough of clear sputum, low grade fevers. Patient endorses some wheezing, with some diaphoresis. Patient denies nausea, no emesis, no abdominal pain, no diarrhea, no constipation, no hematemesis, no hematochezia, no melena, no weakness, no dysuria. Patient states 2 weeks prior to admission was seen at the walk-in clinic where chest x-ray was done which was negative for any acute infiltrate. Patient stated he was placed on prednisone for treatment of possible bronchitis. Patient was seen in the emergency room, comprehensive metabolic profile.had a sodium of 124 glucose of 262 ALT of 65 otherwise was within normal limits. BNP was negative. First set of troponin was negative. CBC had a white count of 11.2 hemoglobin of 11.8 per electronic 434 otherwise was within normal limits. Chest x-ray which was done showed multilobar pneumonia in the left lung base with small pleural effusion. CT chest which was done showed bilateral lower lobe and middle lobe pneumonia worse on the left. Small left pleural effusion. Maximal mediastinal lymph nodes likely reactive. Patient was given a dose of IV Rocephin and azithromycin. [facility] were called to admit the patient for further evaluation and management.   Review of Systems: As per history of present illness otherwise negative. Constitutional:  No weight loss, night sweats, Fevers, chills, fatigue.  HEENT:  No headaches, Difficulty swallowing,Tooth/dental problems,Sore throat,  No sneezing, itching, ear ache, nasal congestion, post nasal drip,  Cardio-vascular:  No chest pain, Orthopnea, PND,  swelling in lower extremities, anasarca, dizziness, palpitations  GI:  No heartburn, indigestion, abdominal pain, nausea, vomiting, diarrhea, change in bowel habits, loss of appetite  Resp:  No shortness of breath with exertion or at rest. No excess mucus, no productive cough, No non-productive cough, No coughing up of blood.No change in color of mucus.No wheezing.No chest wall deformity  Skin:  no rash or lesions.  GU:  no dysuria, change in color of urine, no urgency or frequency. No flank pain.  Musculoskeletal:  No joint pain or swelling. No decreased range of motion. No back pain.  Psych:  No change in mood or affect. No depression or anxiety. No memory loss.   Past Medical History  Diagnosis Date  . Asthma   . Bronchitis   . Diabetes mellitus   . Hypertension   . Skull fracture   . Iliac artery aneurysm   . Peripheral neuropathy   . Hypercholesterolemia   . Subdural hematoma   . Progressive gait disorder   . 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 COPD (chronic obstructive pulmonary disease)    History reviewed. No pertinent past surgical history. Social History:  reports that he quit smoking about 5 months ago. His smoking use included Cigarettes. He has a 30 pack-year smoking history. He has never used smokeless tobacco. He reports that he drinks about 7.0 oz of alcohol per week. He reports that he does not use illicit drugs.  Allergies  Allergen Reactions  . Pneumovax 23 [Pneumococcal Vac Polyvalent]     Fever, sweats  . Prednisone Other (See Comments)    Sweating with high dose  . Penicillins Rash  . Sulfa Antibiotics Rash    Family History  Problem Relation Age of Onset  .  Stroke Father      Prior to Admission medications   Medication Sig Start Date End Date Taking? Authorizing Provider  albuterol (PROVENTIL HFA;VENTOLIN HFA) 108 (90 BASE) MCG/ACT inhaler Inhale 2 puffs into the lungs every 6 (six) hours as needed for wheezing or shortness of breath.   Yes  Historical Provider, MD  Ascorbic Acid (VITAMIN C PO) Take 1 tablet by mouth daily.   Yes Historical Provider, MD  aspirin 81 MG tablet Take 81 mg by mouth daily.   Yes Historical Provider, MD  atorvastatin (LIPITOR) 10 MG tablet Take 10 mg by mouth daily.   Yes Historical Provider, MD  Cyanocobalamin (VITAMIN B 12 PO) Take 500 mg by mouth daily.   Yes Historical Provider, MD  cyclobenzaprine (FLEXERIL) 10 MG tablet Take 10 mg by mouth 3 (three) times daily as needed for muscle spasms.   Yes Historical Provider, MD  finasteride (PROSCAR) 5 MG tablet Take 5 mg by mouth daily.   Yes Historical Provider, MD  Fluticasone-Salmeterol (ADVAIR) 250-50 MCG/DOSE AEPB Inhale 1 puff into the lungs every 12 (twelve) hours.   Yes Historical Provider, MD  lisinopril (PRINIVIL,ZESTRIL) 20 MG tablet Take 20 mg by mouth daily.   Yes Historical Provider, MD  metFORMIN (GLUCOPHAGE) 500 MG tablet Take 500 mg by mouth 2 (two) times daily with a meal.  08/27/13  Yes Historical Provider, MD  Multiple Vitamin (MULTIVITAMIN WITH MINERALS) TABS Take 1 tablet by mouth daily.   Yes Historical Provider, MD  Tamsulosin HCl (FLOMAX) 0.4 MG CAPS Take 0.4 mg by mouth daily.   Yes Historical Provider, MD  amLODipine (NORVASC) 5 MG tablet Take 5 mg by mouth daily.    Historical Provider, MD  dutasteride (AVODART) 0.5 MG capsule Take 0.5 mg by mouth daily.    Historical Provider, MD  glipiZIDE (GLUCOTROL) 5 MG tablet Take 1 tablet (5 mg total) by mouth daily before breakfast. Patient not taking: Reported on 09/07/2014 04/08/14   Caren Griffins, MD  levofloxacin (LEVAQUIN) 750 MG tablet Take 1 tablet (750 mg total) by mouth daily. 04/08/14   Costin Karlyne Greenspan, MD  predniSONE (DELTASONE) 10 MG tablet Take 3 tablets (30 mg total) by mouth daily with breakfast. 2 tablets daily for 3 days then 1 tablet daily for 2 days then stop. 04/08/14   Caren Griffins, MD   Physical Exam: Filed Vitals:   09/07/14 1354 09/07/14 1521 09/07/14 1626  09/07/14 1706  BP:  127/64 126/68 113/74  Pulse:  88 79 93  Temp:  98.1 F (36.7 C) 98.4 F (36.9 C) 98.2 F (36.8 C)  TempSrc:  Oral Oral Oral  Resp:  18 20   Height:      Weight:      SpO2: 93% 96% 96% 94%    Wt Readings from Last 3 Encounters:  09/07/14 88.905 kg (196 lb)  04/05/14 87.091 kg (192 lb)  03/29/14 87.091 kg (192 lb)    General:  Well-developed well-nourished in no acute cardiopulmonary distress. Speaking in full sentences.  Eyes: PERRLA, EOMI, normal lids, irises & conjunctiva ENT: grossly normal hearing, lips & tongue Neck: no LAD, masses or thyromegaly Cardiovascular: RRR, no m/r/g. No LE edema. Respiratory: Coarse breath sounds in the bases bilaterally. No crackles. No wheezing.  Normal respiratory effort. Abdomen: soft, ntnd, positive bowel sounds, no rebound, no guarding Skin: no rash or induration seen on limited exam Musculoskeletal: grossly normal tone BUE/BLE Psychiatric: grossly normal mood and affect, speech fluent and appropriate Neurologic: Alert  and oriented 3. Cranial nerves II through XII are grossly intact. No focal deficits.           Labs on Admission:  Basic Metabolic Panel:  Recent Labs Lab 09/07/14 1350  NA 124*  K 4.3  CL 91*  CO2 24  GLUCOSE 262*  BUN 19  CREATININE 0.61  CALCIUM 8.8   Liver Function Tests:  Recent Labs Lab 09/07/14 1350  AST 29  ALT 65*  ALKPHOS 74  BILITOT 0.3  PROT 8.1  ALBUMIN 3.6   No results for input(s): LIPASE, AMYLASE in the last 168 hours. No results for input(s): AMMONIA in the last 168 hours. CBC:  Recent Labs Lab 09/07/14 1350  WBC 11.2*  NEUTROABS 8.8*  HGB 11.8*  HCT 34.2*  MCV 89.1  PLT 434*   Cardiac Enzymes:  Recent Labs Lab 09/07/14 1350  TROPONINI <0.03    BNP (last 3 results)  Recent Labs  09/07/14 1350  BNP 18.9    ProBNP (last 3 results) No results for input(s): PROBNP in the last 8760 hours.  CBG:  Recent Labs Lab 09/07/14 1818  GLUCAP  177*    Radiological Exams on Admission: Dg Chest 2 View  09/07/2014   CLINICAL DATA:  79 year old male with cough for 2 weeks. Shortness of Breath. No improvement with prednisone. Initial encounter.  EXAM: CHEST  2 VIEW  COMPARISON:  08/21/2014 and earlier.  FINDINGS: Confluent airspace disease now at the left lung base suspected to involve both the lingula and lower lobe. Right lung base appears stable. Small left pleural effusion suspected. Stable cardiac size and mediastinal contours. No pneumothorax or pulmonary edema. Stable visualized osseous structures.  IMPRESSION: Multilobar pneumonia at the left lung base with small pleural effusion.  Post treatment radiographs recommended to document resolution.   Electronically Signed   By: Genevie Ann M.D.   On: 09/07/2014 13:57   Ct Chest W Contrast  09/07/2014   CLINICAL DATA:  79 year old male with cough and shortness of Breath. Left lung pneumonia suspected today on radiographs. Initial encounter.  EXAM: CT CHEST WITH CONTRAST  TECHNIQUE: Multidetector CT imaging of the chest was performed during intravenous contrast administration.  CONTRAST:  37mL OMNIPAQUE IOHEXOL 300 MG/ML  SOLN  COMPARISON:  Chest radiographs 1347 hours today and earlier. CT Abdomen and Pelvis 03/29/2014, and earlier.  FINDINGS: Central airways are patent. There is confluent opacity in consolidation in the lingula with some air bronchograms. There is a large area of consolidation in the left lower lobe basal segments. The left lower lobe superior segment is spared. Associated trace left pleural effusion.  No left hilar lymphadenopathy. No pericardial effusion. Calcified coronary artery atherosclerosis. Negative visualized aorta. Maximal mediastinal lymph nodes up to 10-11 mm short axis. Negative thoracic inlet.  Mild patchy opacity in the medial segment of the right middle lobe. Nodular, distal peribronchial, and early confluent opacity in the posterior basal segments of the right lower  lobe. No right pleural effusion.  No axillary lymphadenopathy. Negative visualized liver, spleen, adrenal glands, renal upper poles, and bowel in the upper abdomen.  No acute osseous abnormality identified.  IMPRESSION: Bilateral lower lobe and middle lobe pneumonia, worse on the left. Small left pleural effusion.  Maximal mediastinal lymph nodes likely are reactive.  Post treatment chest radiographs recommended to document resolution.   Electronically Signed   By: Genevie Ann M.D.   On: 09/07/2014 16:23    EKG: Independently reviewed. Normal sinus rhythm  Assessment/Plan Principal Problem:  CAP (community acquired pneumonia) Active Problems:   Chronic tension headaches   Diabetes mellitus type 2 with complications   Hyponatremia   Hyperlipidemia   COPD (chronic obstructive pulmonary disease)   Leukocytosis   Anemia  #1 multilobar community-acquired pneumonia Patient presenting with worsening shortness of breath, productive cough of clear sputum which had been worsening over 2 weeks.. Chest x-ray CT chest in the emergency room revealing multilobar bilateral pneumonia. Admit patient to telemetry. Check a sputum Gram stain and culture. Check a urine Legionella antigen. Check a urine strep pneumococcus antigen. Check blood cultures 2. Place on oxygen, scheduled nebulizers, Mucinex, IV Levaquin. Follow.  #2 type 2 diabetes mellitus Check a hemoglobin A1c. Hold oral hypoglycemic agents. Place on a sliding scale insulin.  #3 leukocytosis Likely secondary to problem #1. Blood cultures have been ordered and are pending. Urinalysis is nitrite negative leukocytes negative. Patient will be placed empirically on IV Levaquin. Follow.  #4 COPD Stable. No wheezing noted on exam. Follow closely.  #5 anemia Likely anemia of chronic disease. Check an anemia panel. Follow H&H.  #6 hyponatremia Likely secondary to hypovolemic hyponatremia. Check a urine sodium. Check a urine creatinine. Check a serum  osmolality. Check a urine osmolality. Check a TSH. Check a cortisol level. Check orthostatics. Place on IV fluids. Follow.   #7 hyperlipidemia Continue statin.  #8 prophylaxis SCDs for DVT prophylaxis. Code Status: Full DVT Prophylaxis: SCDs Family Communication: updated patient and wife at bedside. Disposition Plan: Admit to telemetry.  Time spent: 25 mins  [facility] Pager [phone]

## 2014-09-07 NOTE — ED Notes (Signed)
Patient transported to CT 

## 2014-09-08 LAB — CBC WITH DIFFERENTIAL/PLATELET
Basophils Absolute: 0 10*3/uL (ref 0.0–0.1)
Basophils Relative: 0 % (ref 0–1)
Eosinophils Absolute: 0 10*3/uL (ref 0.0–0.7)
Eosinophils Relative: 0 % (ref 0–5)
HEMATOCRIT: 33.3 % — AB (ref 39.0–52.0)
HEMOGLOBIN: 11.3 g/dL — AB (ref 13.0–17.0)
Lymphocytes Relative: 10 % — ABNORMAL LOW (ref 12–46)
Lymphs Abs: 1.4 10*3/uL (ref 0.7–4.0)
MCH: 30.6 pg (ref 26.0–34.0)
MCHC: 33.9 g/dL (ref 30.0–36.0)
MCV: 90.2 fL (ref 78.0–100.0)
MONO ABS: 0.9 10*3/uL (ref 0.1–1.0)
MONOS PCT: 7 % (ref 3–12)
NEUTROS ABS: 10.9 10*3/uL — AB (ref 1.7–7.7)
Neutrophils Relative %: 83 % — ABNORMAL HIGH (ref 43–77)
Platelets: 368 10*3/uL (ref 150–400)
RBC: 3.69 MIL/uL — ABNORMAL LOW (ref 4.22–5.81)
RDW: 13.7 % (ref 11.5–15.5)
WBC: 13.2 10*3/uL — ABNORMAL HIGH (ref 4.0–10.5)

## 2014-09-08 LAB — LEGIONELLA ANTIGEN, URINE

## 2014-09-08 LAB — BASIC METABOLIC PANEL
ANION GAP: 9 (ref 5–15)
BUN: 13 mg/dL (ref 6–23)
CHLORIDE: 94 mmol/L — AB (ref 96–112)
CO2: 23 mmol/L (ref 19–32)
Calcium: 8.5 mg/dL (ref 8.4–10.5)
Creatinine, Ser: 0.54 mg/dL (ref 0.50–1.35)
GFR calc Af Amer: >90 mL/min (ref >90)
GFR calc non Af Amer: >90 mL/min (ref >90)
GLUCOSE: 249 mg/dL — AB (ref 70–99)
Potassium: 4.5 mmol/L (ref 3.5–5.1)
Sodium: 126 mmol/L — ABNORMAL LOW (ref 135–145)

## 2014-09-08 LAB — IRON AND TIBC
Iron: 21 ug/dL — ABNORMAL LOW (ref 42–165)
Saturation Ratios: 7 % — ABNORMAL LOW (ref 20–55)
TIBC: 296 ug/dL (ref 215–435)
UIBC: 275 ug/dL (ref 125–400)

## 2014-09-08 LAB — EXPECTORATED SPUTUM ASSESSMENT W REFEX TO RESP CULTURE

## 2014-09-08 LAB — FOLATE: Folate: >20 ng/mL

## 2014-09-08 LAB — FERRITIN: Ferritin: 177 ng/mL (ref 22–322)

## 2014-09-08 LAB — HIV ANTIBODY (ROUTINE TESTING W REFLEX): HIV Screen 4th Generation wRfx: NONREACTIVE

## 2014-09-08 LAB — GLUCOSE, CAPILLARY
GLUCOSE-CAPILLARY: 224 mg/dL — AB (ref 70–99)
GLUCOSE-CAPILLARY: 294 mg/dL — AB (ref 70–99)
Glucose-Capillary: 184 mg/dL — ABNORMAL HIGH (ref 70–99)
Glucose-Capillary: 242 mg/dL — ABNORMAL HIGH (ref 70–99)

## 2014-09-08 LAB — OSMOLALITY: Osmolality: 279 mOsm/kg (ref 275–300)

## 2014-09-08 LAB — EXPECTORATED SPUTUM ASSESSMENT W GRAM STAIN, RFLX TO RESP C

## 2014-09-08 LAB — CREATININE, URINE, RANDOM: Creatinine, Urine: 54 mg/dL

## 2014-09-08 LAB — CORTISOL: Cortisol, Plasma: 12.6 ug/dL

## 2014-09-08 LAB — SODIUM, URINE, RANDOM: SODIUM UR: 90 meq/L

## 2014-09-08 LAB — MAGNESIUM: Magnesium: 2 mg/dL (ref 1.5–2.5)

## 2014-09-08 LAB — OSMOLALITY, URINE: OSMOLALITY UR: 430 mosm/kg (ref 390–1090)

## 2014-09-08 LAB — VITAMIN B12: VITAMIN B 12: 1352 pg/mL — AB (ref 211–911)

## 2014-09-08 NOTE — Care Management Note (Addendum)
    Page 1 of 1   09/09/2014     1:53:47 PM CARE MANAGEMENT NOTE 09/09/2014  Patient:  [name], [name]   Account Number:  [id number]  Date Initiated:  09/08/2014  Documentation initiated by:  Dessa Phi  Subjective/Objective Assessment:   79 y/o m admitted w/CAP.     Action/Plan:   From home.   Anticipated DC Date:  09/09/2014   Anticipated DC Plan:  [place]  CM consult      Choice offered to / List presented to:             Status of service:  Completed, signed off Medicare Important Message given?   (If response is "NO", the following Medicare IM given date fields will be blank) Date Medicare IM given:   Medicare IM given by:   Date Additional Medicare IM given:   Additional Medicare IM given by:    Discharge Disposition:  HOME/SELF CARE  Per UR Regulation:  Reviewed for med. necessity/level of care/duration of stay  If discussed at [place] of Stay Meetings, dates discussed:    Comments:  09/09/14 Dessa Phi RN BSN NCM [phone] d/c home no needs or orders.  09/08/14 Dessa Phi RN BSN NCM [phone] PT/OT-n/a. No anticipated d/c needs.

## 2014-09-08 NOTE — Progress Notes (Signed)
Inpatient Diabetes Program Recommendations  AACE/ADA: New Consensus Statement on Inpatient Glycemic Control (2013)  Target Ranges:  Prepandial:   less than 140 mg/dL      Peak postprandial:   less than 180 mg/dL (1-2 hours)      Critically ill patients:  140 - 180 mg/dL   Reason for Visit: Hyperglycemia  Results for James, Maldonado (MRN [id number]) as of 09/08/2014 15:59  Ref. Range 09/07/2014 21:33 09/08/2014 07:40 09/08/2014 11:38  Glucose-Capillary Latest Range: 70-99 mg/dL 320 (H) 224 (H) 184 (H)    Inpatient Diabetes Program Recommendations Insulin - Basal: May benefit from addition of Levemir 10 units QHS. HgbA1C: Check HgbA1C to assess glycemic control prior to hospitalizaiton  Note: Will follow. Thank you. Lorenda Peck, RD, LDN, CDE Inpatient Diabetes Coordinator [phone]

## 2014-09-08 NOTE — Progress Notes (Signed)
[facility] PROGRESS NOTE  Niclas Markell MWU:[id number] DOB: July 04, 1936 DOA: 09/07/2014  PCP: Marjorie Smolder, MD  Brief HPI: 79 year old male of [place] descent with a past medical history of COPD, asthma and diabetes who presented with a 3 week history of worsening shortness of breath and cough. He was initially treated as an outpatient with steroid for bronchitis. With no improvement, he decided to come into the hospital. He was detected to have multilobar pneumonia in the ED. He was subsequently admitted to the hospital.  Past medical history:  Past Medical History  Diagnosis Date  . Asthma   . Bronchitis   . Diabetes mellitus   . Hypertension   . Skull fracture   . Iliac artery aneurysm   . Peripheral neuropathy   . Hypercholesterolemia   . Subdural hematoma   . Progressive gait disorder   . 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 COPD (chronic obstructive pulmonary disease)     Consultants: None  Procedures: None  Antibiotics: Received ceftriaxone and azithromycin in the ED on 2/24 Levaquin 2/24  Subjective: Patient feels better this morning. Cough is improved. Denies any shortness of breath. He wants to walk. No chest pain.  Objective: Vital Signs  Filed Vitals:   09/07/14 2227 09/08/14 0046 09/08/14 0426 09/08/14 0748  BP:   119/65   Pulse:   96   Temp:   99.4 F (37.4 C)   TempSrc:   Oral   Resp:   16   Height:      Weight:      SpO2: 95% 95% 93% 96%    Intake/Output Summary (Last 24 hours) at 09/08/14 1005 Last data filed at 09/08/14 0700  Gross per 24 hour  Intake   1590 ml  Output   1100 ml  Net    490 ml   Filed Weights   09/07/14 1307  Weight: 88.905 kg (196 lb)   General appearance: alert, cooperative, appears stated age and no distress Resp: Crackles heard bilaterally, scattered. No wheezing. No rhonchi. Good air entry. Cardio: regular rate and rhythm, S1, S2 normal, no murmur, click, rub or gallop GI: soft, non-tender; bowel  sounds normal; no masses,  no organomegaly Extremities: extremities normal, atraumatic, no cyanosis or edema Pulses: 2+ and symmetric Neurologic: Alert and oriented 3. No focal neurological deficits are noted.  Lab Results:  Basic Metabolic Panel:  Recent Labs Lab 09/07/14 1350 09/08/14 0535  NA 124* 126*  K 4.3 4.5  CL 91* 94*  CO2 24 23  GLUCOSE 262* 249*  BUN 19 13  CREATININE 0.61 0.54  CALCIUM 8.8 8.5  MG  --  2.0   Liver Function Tests:  Recent Labs Lab 09/07/14 1350  AST 29  ALT 65*  ALKPHOS 74  BILITOT 0.3  PROT 8.1  ALBUMIN 3.6   CBC:  Recent Labs Lab 09/07/14 1350 09/08/14 0535  WBC 11.2* 13.2*  NEUTROABS 8.8* 10.9*  HGB 11.8* 11.3*  HCT 34.2* 33.3*  MCV 89.1 90.2  PLT 434* 368   Cardiac Enzymes:  Recent Labs Lab 09/07/14 1350  TROPONINI <0.03   BNP (last 3 results)  Recent Labs  09/07/14 1350  BNP 18.9    CBG:  Recent Labs Lab 09/07/14 1818 09/07/14 2133 09/08/14 0740  GLUCAP 177* 320* 224*    Recent Results (from the past 240 hour(s))  Culture, sputum-assessment     Status: None   Collection Time: 09/08/14  1:08 AM  Result Value Ref Range Status   Specimen  Description SPUTUM  Final   Special Requests NONE  Final   Sputum evaluation   Final    THIS SPECIMEN IS ACCEPTABLE. RESPIRATORY CULTURE REPORT TO FOLLOW.   Report Status 09/08/2014 FINAL  Final      Studies/Results: Dg Chest 2 View  09/07/2014   CLINICAL DATA:  79 year old male with cough for 2 weeks. Shortness of Breath. No improvement with prednisone. Initial encounter.  EXAM: CHEST  2 VIEW  COMPARISON:  08/21/2014 and earlier.  FINDINGS: Confluent airspace disease now at the left lung base suspected to involve both the lingula and lower lobe. Right lung base appears stable. Small left pleural effusion suspected. Stable cardiac size and mediastinal contours. No pneumothorax or pulmonary edema. Stable visualized osseous structures.  IMPRESSION: Multilobar  pneumonia at the left lung base with small pleural effusion.  Post treatment radiographs recommended to document resolution.   Electronically Signed   By: Genevie Ann M.D.   On: 09/07/2014 13:57   Ct Chest W Contrast  09/07/2014   CLINICAL DATA:  79 year old male with cough and shortness of Breath. Left lung pneumonia suspected today on radiographs. Initial encounter.  EXAM: CT CHEST WITH CONTRAST  TECHNIQUE: Multidetector CT imaging of the chest was performed during intravenous contrast administration.  CONTRAST:  37mL OMNIPAQUE IOHEXOL 300 MG/ML  SOLN  COMPARISON:  Chest radiographs 1347 hours today and earlier. CT Abdomen and Pelvis 03/29/2014, and earlier.  FINDINGS: Central airways are patent. There is confluent opacity in consolidation in the lingula with some air bronchograms. There is a large area of consolidation in the left lower lobe basal segments. The left lower lobe superior segment is spared. Associated trace left pleural effusion.  No left hilar lymphadenopathy. No pericardial effusion. Calcified coronary artery atherosclerosis. Negative visualized aorta. Maximal mediastinal lymph nodes up to 10-11 mm short axis. Negative thoracic inlet.  Mild patchy opacity in the medial segment of the right middle lobe. Nodular, distal peribronchial, and early confluent opacity in the posterior basal segments of the right lower lobe. No right pleural effusion.  No axillary lymphadenopathy. Negative visualized liver, spleen, adrenal glands, renal upper poles, and bowel in the upper abdomen.  No acute osseous abnormality identified.  IMPRESSION: Bilateral lower lobe and middle lobe pneumonia, worse on the left. Small left pleural effusion.  Maximal mediastinal lymph nodes likely are reactive.  Post treatment chest radiographs recommended to document resolution.   Electronically Signed   By: Genevie Ann M.D.   On: 09/07/2014 16:23    Medications:  Scheduled: . amLODipine  5 mg Oral Daily  . aspirin EC  81 mg Oral  Daily  . atorvastatin  10 mg Oral Daily  . dextromethorphan-guaiFENesin  2 tablet Oral BID  . finasteride  5 mg Oral Daily  . insulin aspart  0-15 Units Subcutaneous TID WC  . ipratropium-albuterol  3 mL Nebulization Q4H  . levofloxacin (LEVAQUIN) IV  750 mg Intravenous Q24H  . lisinopril  20 mg Oral Daily  . mometasone-formoterol  2 puff Inhalation BID  . multivitamin with minerals  1 tablet Oral Daily  . pantoprazole  40 mg Oral Q0600  . tamsulosin  0.4 mg Oral Daily   Continuous: . sodium chloride 100 mL/hr (09/07/14 1900)   OEV:[id number], cyclobenzaprine, ondansetron (ZOFRAN) IV  Assessment/Plan:  Principal Problem:   CAP (community acquired pneumonia) Active Problems:   Chronic tension headaches   Diabetes mellitus type 2 with complications   Hyponatremia   Hyperlipidemia   COPD (chronic obstructive pulmonary disease)  Leukocytosis   Anemia    Multilobar community-acquired pneumonia Patient placed on antibiotics. He is feeling better. Cultures negative so far. He saturating normal on room air. Continue current treatment. Strep and legionella antigens are negative.  Type 2 diabetes mellitus Reasonably well-controlled. HbA1c was 6.8 in September. Continue sliding scale coverage. Holding his oral agents.   Leukocytosis Could be due to infection as well as the fact that he was on steroids recently. WBC has gone up slightly. He remains afebrile. Continue to monitor.   Hyponatremia Likely secondary to hypovolemic hyponatremia. The patient is had this before. Review of his old records show that he did have hyponatremia previously. He states that his primary care physician has been working him up for the same. TSH is 1.3. Urine osmolality is 430. There could be an element of SIADH. Continue IV fluids. Further management as outpatient.   History of COPD Stable. No wheezing noted on exam. Follow closely.  Normocytic anemia Likely anemia of chronic disease. Anemia  panel reviewed. No iron deficiency noted.   Hyperlipidemia Continue statin.  DVT Prophylaxis: SCDs Code Status: Full code  Family Communication: Discussed with the patient  Disposition Plan: Not ready for discharge    LOS: 1 day   [facility] Pager [phone] 09/08/2014, 10:05 AM  If 7PM-7AM, please contact night-coverage at [email or web address], password [facility]

## 2014-09-08 NOTE — Evaluation (Signed)
  Occupational Therapy Evaluation Patient Details Name: [name] MRN: [id number] DOB: 07-31-1935 Today's Date: 09-19-2014    History of Present Illness Pt is a 79 year old male with history of COPD, asthma, HTN, DM, hyperlipidemia admitted with multilobar CAP   Clinical Impression   Pt at baseline - no further OT needed    Follow Up Recommendations  No OT follow up    Equipment Recommendations       Recommendations for Other Services       Precautions / Restrictions Precautions Precautions: None      Mobility Bed Mobility Overal bed mobility: Modified Independent                Transfers Overall transfer level: Modified independent                    Balance Overall balance assessment: No apparent balance deficits (not formally assessed)                                          ADL Overall ADL's : At baseline                                                       Pertinent Vitals/Pain Pain Assessment: No/denies pain     Hand Dominance     Extremity/Trunk Assessment Upper Extremity Assessment Upper Extremity Assessment: Overall WFL for tasks assessed   Lower Extremity Assessment Lower Extremity Assessment: Overall WFL for tasks assessed       Communication Communication Communication: No difficulties   Cognition Arousal/Alertness: Awake/alert Behavior During Therapy: WFL for tasks assessed/performed Overall Cognitive Status: Within Functional Limits for tasks assessed                     General Comments               Home Living Family/patient expects to be discharged to:: Private residence Living Arrangements: Spouse/significant other Available Help at Discharge: Family Type of Home: House       Home Layout: Two level     Bathroom Shower/Tub: Walk-in shower;Tub/shower unit   Bathroom Toilet: Standard                Prior Functioning/Environment Level of  Independence: Independent                                       End of Session Nurse Communication: Mobility status  Activity Tolerance: Patient tolerated treatment well Patient left: in chair   Time: 1254-1310 OT Time Calculation (min): 16 min Charges:  OT General Charges $OT Visit: 1 Procedure OT Treatments $Self Care/Home Management : 8-22 mins G-Codes:    Payton Mccallum D 09-19-2014, 1:15 PM

## 2014-09-08 NOTE — Evaluation (Signed)
Physical Therapy One Time Evaluation Patient Details Name: [name] MRN: [id number] DOB: 1935/08/09 Today's Date: 09/08/2014   History of Present Illness  Pt is a 79 year old male with history of COPD, asthma, HTN, DM, hyperlipidemia admitted with multilobar CAP  Clinical Impression  Patient evaluated by Physical Therapy with no further acute PT needs identified. All education has been completed and the patient has no further questions.  See below for any follow-up Physical Therapy or equipment needs. PT is signing off. Thank you for this referral.     Follow Up Recommendations No PT follow up    Equipment Recommendations  None recommended by PT    Recommendations for Other Services       Precautions / Restrictions Precautions Precautions: None      Mobility  Bed Mobility Overal bed mobility: Modified Independent                Transfers Overall transfer level: Modified independent                  Ambulation/Gait Ambulation/Gait assistance: Supervision;Modified independent (Device/Increase time) Ambulation Distance (Feet): 400 Feet   Gait Pattern/deviations: WFL(Within Functional Limits)     General Gait Details: pushed IV pole, denies any symptoms, encouraged pt to ambulate while in hospital  Stairs            Wheelchair Mobility    Modified Rankin (Stroke Patients Only)       Balance Overall balance assessment: No apparent balance deficits (not formally assessed)                                           Pertinent Vitals/Pain Pain Assessment: No/denies pain    Home Living Family/patient expects to be discharged to:: Private residence Living Arrangements: Spouse/significant other Available Help at Discharge: Family Type of Home: House       Home Layout: Two level        Prior Function Level of Independence: Independent               Hand Dominance        Extremity/Trunk Assessment   Upper Extremity Assessment: Overall WFL for tasks assessed           Lower Extremity Assessment: Overall WFL for tasks assessed         Communication   Communication: No difficulties  Cognition Arousal/Alertness: Awake/alert Behavior During Therapy: WFL for tasks assessed/performed Overall Cognitive Status: Within Functional Limits for tasks assessed                      General Comments      Exercises        Assessment/Plan    PT Assessment Patent does not need any further PT services  PT Diagnosis     PT Problem List    PT Treatment Interventions     PT Goals (Current goals can be found in the Care Plan section) Acute Rehab PT Goals PT Goal Formulation: All assessment and education complete, DC therapy    Frequency     Barriers to discharge        Co-evaluation               End of Session   Activity Tolerance: Patient tolerated treatment well Patient left: with call bell/phone within reach;in bed Nurse Communication: Mobility status  Time: 1610-9604 PT Time Calculation (min) (ACUTE ONLY): 11 min   Charges:   PT Evaluation $Initial PT Evaluation Tier I: 1 Procedure     PT G Codes:        Markcus Lazenby,KATHrine E 09/08/2014, 1:00 PM Carmelia Bake, PT, DPT 09/08/2014 Pager: [phone]

## 2014-09-09 LAB — BASIC METABOLIC PANEL
ANION GAP: 6 (ref 5–15)
BUN: 14 mg/dL (ref 6–23)
CHLORIDE: 98 mmol/L (ref 96–112)
CO2: 27 mmol/L (ref 19–32)
Calcium: 8.6 mg/dL (ref 8.4–10.5)
Creatinine, Ser: 0.56 mg/dL (ref 0.50–1.35)
GFR calc Af Amer: >90 mL/min (ref >90)
GFR calc non Af Amer: >90 mL/min (ref >90)
GLUCOSE: 226 mg/dL — AB (ref 70–99)
Potassium: 4.9 mmol/L (ref 3.5–5.1)
SODIUM: 131 mmol/L — AB (ref 135–145)

## 2014-09-09 LAB — CBC
HCT: 33.2 % — ABNORMAL LOW (ref 39.0–52.0)
Hemoglobin: 11.3 g/dL — ABNORMAL LOW (ref 13.0–17.0)
MCH: 31 pg (ref 26.0–34.0)
MCHC: 34 g/dL (ref 30.0–36.0)
MCV: 91 fL (ref 78.0–100.0)
Platelets: 371 10*3/uL (ref 150–400)
RBC: 3.65 MIL/uL — AB (ref 4.22–5.81)
RDW: 13.9 % (ref 11.5–15.5)
WBC: 9.1 10*3/uL (ref 4.0–10.5)

## 2014-09-09 LAB — HEMOGLOBIN A1C
Hgb A1c MFr Bld: 8.5 % — ABNORMAL HIGH (ref 4.8–5.6)
Mean Plasma Glucose: 197 mg/dL

## 2014-09-09 LAB — GLUCOSE, CAPILLARY: GLUCOSE-CAPILLARY: 247 mg/dL — AB (ref 70–99)

## 2014-09-09 MED ORDER — METFORMIN HCL 500 MG PO TABS: 500.0000 mg | ORAL_TABLET | Freq: Two times a day (BID) | ORAL | Status: AC

## 2014-09-09 MED ORDER — LEVOFLOXACIN 750 MG PO TABS
750.0000 mg | ORAL_TABLET | Freq: Every day | ORAL | Status: DC
  Filled 2014-09-09: qty 1

## 2014-09-09 MED ORDER — IPRATROPIUM-ALBUTEROL 0.5-2.5 (3) MG/3ML IN SOLN
3.0000 mL | Freq: Three times a day (TID) | RESPIRATORY_TRACT | Status: DC
  Administered 2014-09-09: 3 mL via RESPIRATORY_TRACT
  Filled 2014-09-09: qty 3

## 2014-09-09 MED ORDER — LEVOFLOXACIN 750 MG PO TABS: 750.0000 mg | ORAL_TABLET | Freq: Every day | ORAL | Status: DC

## 2014-09-09 NOTE — Discharge Summary (Signed)
[facility]  Physician Discharge Summary   Patient ID: [name] MRN: [id number] DOB/AGE: 1935/11/03 79 y.o.  Admit date: 09/07/2014 Discharge date: 09/09/2014  PCP: Marjorie Smolder, MD  DISCHARGE DIAGNOSES:  Principal Problem:   CAP (community acquired pneumonia) Active Problems:   Chronic tension headaches   Diabetes mellitus type 2 with complications   Hyponatremia   Hyperlipidemia   COPD (chronic obstructive pulmonary disease)   Leukocytosis   Anemia   RECOMMENDATIONS FOR OUTPATIENT FOLLOW UP: 1. Patient has an appointment with the pulmonologist this Tuesday 2. Monitor blood sugars at home 3. May need further workup for hyponatremia as an outpatient  DISCHARGE CONDITION: fair  Diet recommendation: Modified carbohydrate  Filed Weights   09/07/14 1307  Weight: 88.905 kg (196 lb)    INITIAL HISTORY: 79 year old male of [place] descent with a past medical history of COPD, asthma and diabetes who presented with a 3 week history of worsening shortness of breath and cough. He was initially treated as an outpatient with steroid for bronchitis. With no improvement, he decided to come into the hospital. He was detected to have multilobar pneumonia in the ED. He was subsequently admitted to the hospital.  Consultations:  None  Procedures:  None  HOSPITAL COURSE:   Multilobar community-acquired pneumonia Patient was placed on intravenous antibiotics. He improved rather quickly. Cultures have been negative. He is feeling better. He is saturating normal on room air. Strep and legionella antigens are negative. Denies any nausea, vomiting. He was changed over to oral antibiotics. This morning, and then was discharged.  Type 2 diabetes mellitus CBGs are running high, presumably because he's been off of his metformin. He can resume it tonight. HbA1c was noted to be 8.5 here. This could have something to do with recent steroid intake. He is been asked to check his  CBGs at home and call his doctor if it stays above 200 despite getting back on metformin. He tells me that he is not taking glipizide anymore.  Leukocytosis Could be due to infection as well as the fact that he was on steroids recently. WBC is normal today.   Hyponatremia Likely secondary to hypovolemic hyponatremia. The patient has had this before. Review of his old records show that he did have hyponatremia previously. He states that his primary care physician has been working him up for the same. TSH is 1.3. Urine osmolality is 430. There could be an element of SIADH. Sodium levels improved with IV fluids. Further management as outpatient.   History of COPD Stable. No wheezing noted on exam. Continue home medications.  Normocytic anemia Likely anemia of chronic disease. Anemia panel reviewed. No iron deficiency noted. Monitoring as outpatient  Hyperlipidemia Continue statin.  Overall improved. Patient has ambulated without difficulties. Discussed with him and his wife at bedside. Okay for discharge.  PERTINENT LABS:  The results of significant diagnostics from this hospitalization (including imaging, microbiology, ancillary and laboratory) are listed below for reference.    Microbiology: Recent Results (from the past 240 hour(s))  Culture, blood (routine x 2)     Status: None (Preliminary result)   Collection Time: 09/07/14  7:10 PM  Result Value Ref Range Status   Specimen Description BLOOD RIGHT HAND  Final   Special Requests BOTTLES DRAWN AEROBIC AND ANAEROBIC 10CC EACH  Final   Culture   Final           BLOOD CULTURE RECEIVED NO GROWTH TO DATE CULTURE WILL BE HELD FOR 5 DAYS BEFORE ISSUING A  FINAL NEGATIVE REPORT Performed at [facility]    Report Status PENDING  Incomplete  Culture, blood (routine x 2)     Status: None (Preliminary result)   Collection Time: 09/07/14  7:25 PM  Result Value Ref Range Status   Specimen Description BLOOD RIGHT ARM  Final    Special Requests BOTTLES DRAWN AEROBIC AND ANAEROBIC 10CC EACH  Final   Culture   Final           BLOOD CULTURE RECEIVED NO GROWTH TO DATE CULTURE WILL BE HELD FOR 5 DAYS BEFORE ISSUING A FINAL NEGATIVE REPORT Performed at [facility]    Report Status PENDING  Incomplete  Culture, sputum-assessment     Status: None   Collection Time: 09/08/14  1:08 AM  Result Value Ref Range Status   Specimen Description SPUTUM  Final   Special Requests NONE  Final   Sputum evaluation   Final    THIS SPECIMEN IS ACCEPTABLE. RESPIRATORY CULTURE REPORT TO FOLLOW.   Report Status 09/08/2014 FINAL  Final  Culture, respiratory (NON-Expectorated)     Status: None (Preliminary result)   Collection Time: 09/08/14  1:08 AM  Result Value Ref Range Status   Specimen Description SPUTUM  Final   Special Requests NONE  Final   Gram Stain   Final    ABUNDANT WBC PRESENT, PREDOMINANTLY PMN RARE SQUAMOUS EPITHELIAL CELLS PRESENT ABUNDANT GRAM POSITIVE RODS RARE GRAM POSITIVE COCCI IN PAIRS    Culture   Final    NORMAL OROPHARYNGEAL FLORA Performed at [facility]    Report Status PENDING  Incomplete     Labs: Basic Metabolic Panel:  Recent Labs Lab 09/07/14 1350 09/08/14 0535 09/09/14 0431  NA 124* 126* 131*  K 4.3 4.5 4.9  CL 91* 94* 98  CO2 24 23 27   GLUCOSE 262* 249* 226*  BUN 19 13 14   CREATININE 0.61 0.54 0.56  CALCIUM 8.8 8.5 8.6  MG  --  2.0  --    Liver Function Tests:  Recent Labs Lab 09/07/14 1350  AST 29  ALT 65*  ALKPHOS 74  BILITOT 0.3  PROT 8.1  ALBUMIN 3.6   CBC:  Recent Labs Lab 09/07/14 1350 09/08/14 0535 09/09/14 0431  WBC 11.2* 13.2* 9.1  NEUTROABS 8.8* 10.9*  --   HGB 11.8* 11.3* 11.3*  HCT 34.2* 33.3* 33.2*  MCV 89.1 90.2 91.0  PLT 434* 368 371   Cardiac Enzymes:  Recent Labs Lab 09/07/14 1350  TROPONINI <0.03   BNP: BNP (last 3 results)  Recent Labs  09/07/14 1350  BNP 18.9    CBG:  Recent Labs Lab 09/08/14 0740  09/08/14 1138 09/08/14 1648 09/08/14 2119 09/09/14 0725  GLUCAP 224* 184* 294* 242* 247*     IMAGING STUDIES Dg Chest 2 View  09/07/2014   CLINICAL DATA:  79 year old male with cough for 2 weeks. Shortness of Breath. No improvement with prednisone. Initial encounter.  EXAM: CHEST  2 VIEW  COMPARISON:  08/21/2014 and earlier.  FINDINGS: Confluent airspace disease now at the left lung base suspected to involve both the lingula and lower lobe. Right lung base appears stable. Small left pleural effusion suspected. Stable cardiac size and mediastinal contours. No pneumothorax or pulmonary edema. Stable visualized osseous structures.  IMPRESSION: Multilobar pneumonia at the left lung base with small pleural effusion.  Post treatment radiographs recommended to document resolution.   Electronically Signed   By: Genevie Ann M.D.   On: 09/07/2014 13:57  Ct Chest W Contrast  09/07/2014   CLINICAL DATA:  79 year old male with cough and shortness of Breath. Left lung pneumonia suspected today on radiographs. Initial encounter.  EXAM: CT CHEST WITH CONTRAST  TECHNIQUE: Multidetector CT imaging of the chest was performed during intravenous contrast administration.  CONTRAST:  29mL OMNIPAQUE IOHEXOL 300 MG/ML  SOLN  COMPARISON:  Chest radiographs 1347 hours today and earlier. CT Abdomen and Pelvis 03/29/2014, and earlier.  FINDINGS: Central airways are patent. There is confluent opacity in consolidation in the lingula with some air bronchograms. There is a large area of consolidation in the left lower lobe basal segments. The left lower lobe superior segment is spared. Associated trace left pleural effusion.  No left hilar lymphadenopathy. No pericardial effusion. Calcified coronary artery atherosclerosis. Negative visualized aorta. Maximal mediastinal lymph nodes up to 10-11 mm short axis. Negative thoracic inlet.  Mild patchy opacity in the medial segment of the right middle lobe. Nodular, distal peribronchial, and  early confluent opacity in the posterior basal segments of the right lower lobe. No right pleural effusion.  No axillary lymphadenopathy. Negative visualized liver, spleen, adrenal glands, renal upper poles, and bowel in the upper abdomen.  No acute osseous abnormality identified.  IMPRESSION: Bilateral lower lobe and middle lobe pneumonia, worse on the left. Small left pleural effusion.  Maximal mediastinal lymph nodes likely are reactive.  Post treatment chest radiographs recommended to document resolution.   Electronically Signed   By: Genevie Ann M.D.   On: 09/07/2014 16:23    DISCHARGE EXAMINATION: Filed Vitals:   09/09/14 0457 09/09/14 0539 09/09/14 0751 09/09/14 0817  BP:  130/74  115/65  Pulse:  86  96  Temp:  97.4 F (36.3 C)    TempSrc:  Oral    Resp:  18    Height:      Weight:      SpO2: 92% 98% 93%    General appearance: alert, cooperative, appears stated age and no distress Resp: clear to auscultation bilaterally Cardio: regular rate and rhythm, S1, S2 normal, no murmur, click, rub or gallop GI: soft, non-tender; bowel sounds normal; no masses,  no organomegaly  DISPOSITION: Home with wife  Discharge Instructions    Call MD for:  difficulty breathing, headache or visual disturbances    Complete by:  As directed      Call MD for:  extreme fatigue    Complete by:  As directed      Call MD for:  persistant dizziness or light-headedness    Complete by:  As directed      Call MD for:  persistant nausea and vomiting    Complete by:  As directed      Call MD for:  temperature >100.4    Complete by:  As directed      Diet Carb Modified    Complete by:  As directed      Discharge instructions    Complete by:  As directed   Follow up with your doctor next week. Check your blood sugar daily and if stays greater than 200 by Monday call your doctor for further instructions.     Increase activity slowly    Complete by:  As directed            ALLERGIES:  Allergies  Allergen  Reactions  . Pneumovax 23 [Pneumococcal Vac Polyvalent]     Fever, sweats  . Prednisone Other (See Comments)    Sweating with high dose  . Penicillins Rash  .  Sulfa Antibiotics Rash      Discharge Medication List as of 09/09/2014 10:32 AM    START taking these medications   Details  levofloxacin (LEVAQUIN) 750 MG tablet Take 1 tablet (750 mg total) by mouth daily. For 4 more days starting 09/10/14, Starting 09/09/2014, Until Discontinued, Print      CONTINUE these medications which have CHANGED   Details  metFORMIN (GLUCOPHAGE) 500 MG tablet Take 1 tablet (500 mg total) by mouth 2 (two) times daily with a meal. Resume tonight., Starting 09/09/2014, Until Discontinued, No Print      CONTINUE these medications which have NOT CHANGED   Details  albuterol (PROVENTIL HFA;VENTOLIN HFA) 108 (90 BASE) MCG/ACT inhaler Inhale 2 puffs into the lungs every 6 (six) hours as needed for wheezing or shortness of breath., Until Discontinued, Historical Med    Ascorbic Acid (VITAMIN C PO) Take 1 tablet by mouth daily., Until Discontinued, Historical Med    aspirin 81 MG tablet Take 81 mg by mouth daily., Until Discontinued, Historical Med    atorvastatin (LIPITOR) 10 MG tablet Take 10 mg by mouth daily., Until Discontinued, Historical Med    Cyanocobalamin (VITAMIN B 12 PO) Take 500 mg by mouth daily., Until Discontinued, Historical Med    cyclobenzaprine (FLEXERIL) 10 MG tablet Take 10 mg by mouth 3 (three) times daily as needed for muscle spasms., Until Discontinued, Historical Med    finasteride (PROSCAR) 5 MG tablet Take 5 mg by mouth daily., Until Discontinued, Historical Med    Fluticasone-Salmeterol (ADVAIR) 250-50 MCG/DOSE AEPB Inhale 1 puff into the lungs every 12 (twelve) hours., Until Discontinued, Historical Med    lisinopril (PRINIVIL,ZESTRIL) 20 MG tablet Take 20 mg by mouth daily., Until Discontinued, Historical Med    Multiple Vitamin (MULTIVITAMIN WITH MINERALS) TABS Take 1  tablet by mouth daily., Until Discontinued, Historical Med    Tamsulosin HCl (FLOMAX) 0.4 MG CAPS Take 0.4 mg by mouth daily., Until Discontinued, Historical Med      STOP taking these medications     amLODipine (NORVASC) 5 MG tablet        Follow-up Information    Follow up with Marjorie Smolder, MD. Schedule an appointment as soon as possible for a visit in 1 week.   Specialty:  Family Medicine   Why:  post hospitalization follow up   Contact information:   [address] [address] [place]: [facility] Pager [phone]  09/09/2014, 12:43 PM

## 2014-09-09 NOTE — Discharge Instructions (Signed)

## 2014-09-10 LAB — CULTURE, RESPIRATORY W GRAM STAIN

## 2014-09-10 LAB — CULTURE, RESPIRATORY: CULTURE: NORMAL

## 2014-09-13 ENCOUNTER — Encounter: Payer: Self-pay | Admitting: Internal Medicine

## 2014-09-13 ENCOUNTER — Telehealth: Payer: Self-pay | Admitting: Internal Medicine

## 2014-09-13 ENCOUNTER — Ambulatory Visit (INDEPENDENT_AMBULATORY_CARE_PROVIDER_SITE_OTHER): Payer: PPO | Admitting: Internal Medicine

## 2014-09-13 ENCOUNTER — Ambulatory Visit (INDEPENDENT_AMBULATORY_CARE_PROVIDER_SITE_OTHER)
Admission: RE | Admit: 2014-09-13 | Discharge: 2014-09-13 | Disposition: A | Discharge status: Home / Self Care | Payer: PPO | Source: Ambulatory Visit | Attending: Internal Medicine | Admitting: Internal Medicine

## 2014-09-13 VITALS — BP 116/64 | HR 93 | Ht 70.0 in | Wt 197.2 lb

## 2014-09-13 DIAGNOSIS — J449 Chronic obstructive pulmonary disease, unspecified: Secondary | ICD-10-CM

## 2014-09-13 DIAGNOSIS — I10 Essential (primary) hypertension: Secondary | ICD-10-CM | POA: Insufficient documentation

## 2014-09-13 DIAGNOSIS — J189 Pneumonia, unspecified organism: Secondary | ICD-10-CM

## 2014-09-13 MED ORDER — VALSARTAN 160 MG PO TABS: 160.0000 mg | ORAL_TABLET | Freq: Every day | ORAL | Status: DC

## 2014-09-13 MED ORDER — PREDNISONE 10 MG PO TABS: ORAL_TABLET | ORAL | Status: DC

## 2014-09-13 NOTE — Telephone Encounter (Signed)
I never called this pt CXR has not even been signed yet Spoke with him and notified that we will call once cxr is reviewed  Nothing further needed

## 2014-09-13 NOTE — Telephone Encounter (Signed)
I do not see any results in Leslie's results box.  Pt's CXR is back, but not resulted by Dr. Melvyn Novas, not sure who called.  Magda Paganini, did you call patient?

## 2014-09-13 NOTE — Patient Instructions (Addendum)
Stop advair and lisinopril   Start diovan 160 mg one daily   Dulera = Symbicort 160 Take 2 puffs first thing in am and then another 2 puffs about 12 hours later.    Only use your albuterol (proair) as a rescue medication to be used if you can't catch your breath by resting or doing a relaxed purse lip breathing pattern.  - The less you use it, the better it will work when you need it. - Ok to use up to 2 puffs  every 4 hours if you must but call for immediate appointment if use goes up over your usual need - Don't leave home without it !!  (think of it like the spare tire for your car)   Prednisone 10 mg take  4 each am x 2 days,   2 each am x 2 days,  1 each am x 2 days and stop   Please remember to go to the  x-ray department downstairs for your tests - we will call you with the results when they are available.  Please schedule a follow up office visit in 2 weeks, sooner if needed  To see Tammy NP to recheck your blood pressure and make sure you're on the right track

## 2014-09-13 NOTE — Assessment & Plan Note (Signed)
Baseline cxr 08/21/14 was clear then much worse in subsequent days typical of a "cxr lag" which may still apply today as he looks great though his cxr is not improved  For now will focus on optimizing his symptoms and follow the cxr 's serially, perhaps check an esr next ov

## 2014-09-13 NOTE — Assessment & Plan Note (Addendum)
11/30/13  FEV1  1.76(57%) ratio 58 and no better p saba, dlco 78%   DDX of  difficult airways management all start with A and  include Adherence, Ace Inhibitors, Acid Reflux, Active Sinus Disease, Alpha 1 Antitripsin deficiency, Anxiety masquerading as Airways dz,  ABPA,  allergy(esp in young), Aspiration (esp in elderly), Adverse effects of DPI,  Active smokers, plus two Bs  = Bronchiectasis and Beta blocker use..and one C= CHF  Adherence is always the initial "prime suspect" and is a multilayered concern that requires a "trust but verify" approach in every patient - starting with knowing how to use medications, especially inhalers, correctly, keeping up with refills and understanding the fundamental difference between maintenance and prns vs those medications only taken for a very short course and then stopped and not refilled.  The proper method of use, as well as anticipated side effects, of a metered-dose inhaler are discussed and demonstrated to the patient. Improved effectiveness after extensive coaching during this visit to a level of approximately  75% so try symbicort 160 2bid  ? Adverse effect of advair > try off  ? acei effect > only way to know is try off and f/u with pfts   ? Allergy/ asthma > Prednisone 10 mg take  4 each am x 2 days,   2 each am x 2 days,  1 each am x 2 days and stop     Each maintenance medication was reviewed in detail including most importantly the difference between maintenance and as needed and under what circumstances the prns are to be used.  Please see instructions for details which were reviewed in writing and the patient given a copy.

## 2014-09-13 NOTE — Progress Notes (Signed)
Subjective:     Patient ID: [name], male   DOB: 1936/02/18,   MRN: [id number]  HPI  82 yowm quit smoking at dx of pna 03/2014 maintiained on advair since mid 2000s with confirmed GOLD II copd 11/30/13 referred to pulmonary clinic 09/13/2014  by Dr [name] for new dx of pna with flare of symptoms Aug 21 2014  Admit date: 09/07/2014 Discharge date: 09/09/2014  PCP: James Smolder, MD  DISCHARGE DIAGNOSES:  Principal Problem:  CAP (community acquired pneumonia) Active Problems:  Chronic tension headaches  Diabetes mellitus type 2 with complications  Hyponatremia  Hyperlipidemia  COPD (chronic obstructive pulmonary disease)  Leukocytosis  Anemia   RECOMMENDATIONS FOR OUTPATIENT FOLLOW UP: 1. Patient has an appointment with the pulmonologist this Tuesday 2. Monitor blood sugars at home 3. May need further workup for hyponatremia as an outpatient  DISCHARGE CONDITION: fair  Diet recommendation: Modified carbohydrate  Filed Weights   09/07/14 1307  Weight: 88.905 kg (196 lb)    INITIAL HISTORY: 79 year old male of [place] descent with a past medical history of COPD, asthma and diabetes who presented with a 3 week history of worsening shortness of breath and cough. He was initially treated as an outpatient with steroid for bronchitis. With no improvement, he decided to come into the hospital. He was detected to have multilobar pneumonia in the ED. He was subsequently admitted to the hospital.    HOSPITAL COURSE:   Multilobar community-acquired pneumonia Patient was placed on intravenous antibiotics. He improved rather quickly. Cultures have been negative. He is feeling better. He is saturating normal on room air. Strep and legionella antigens are negative. Denies any nausea, vomiting. He was changed over to oral antibiotics. This morning, and then was discharged.          09/13/2014 1st [facility] office visit/ [name]   Chief Complaint  Patient  presents with  . Advice Only    referred by Dr. Darcus Maldonado, Pneumonia   onset was abrupt on Super bowl Sunday/ cough/congestion and increase need for albterol/ mucus mostly white, never high fever admitted Last levaquin Feb 29, feeling back to baseline = doe with more than slow adls/ chronic dry daytime cough, neither  cough or sob better with saba/ still on advair 250 bid.  No obvious day to day or daytime variabilty or assoc chronic cough or cp or chest tightness, subjective wheeze overt sinnus or hb symptoms. No unusual exp hx or h/o childhood pna/ asthma or knowledge of premature birth.  Sleeping ok without nocturnal  or early am exacerbation  of respiratory  c/o's or need for noct saba. Also denies any obvious fluctuation of symptoms with weather or environmental changes or other aggravating or alleviating factors except as outlined above   Current Medications, Allergies, Complete Past Medical History, Past Surgical History, Family History, and Social History were reviewed in [employer] record.  ROS  The following are not active complaints unless bolded sore throat, dysphagia, dental problems, itching, sneezing,  nasal congestion or excess/ purulent secretions, ear ache,   fever, chills, sweats, unintended wt loss, pleuritic or exertional cp, hemoptysis,  orthopnea pnd or leg swelling, presyncope, palpitations, heartburn, abdominal pain, anorexia, nausea, vomiting, diarrhea  or change in bowel or urinary habits, change in stools or urine, dysuria,hematuria,  rash, arthralgias, visual complaints, headache, numbness weakness or ataxia or problems with walking or coordination,  change in mood/affect or memory.     Marland Kitchen  Review of Systems     Objective:   Physical Exam amb hoarse wm nad/ prominent pseudowheeze   Wt Readings from Last 3 Encounters:  09/13/14 197 lb 3.2 oz (89.449 kg)  09/07/14 196 lb (88.905 kg)  04/05/14 192 lb (87.091 kg)     Vital signs reviewed   HEENT: nl dentition, turbinates, and orophanx. Nl external ear canals without cough reflex   NECK :  without JVD/Nodes/TM/ nl carotid upstrokes bilaterally   LUNGS: no acc muscle use, clear to A and P bilaterally without cough on insp or exp maneuvers   CV:  RRR  no s3 or murmur or increase in P2, no edema   ABD:  soft and nontender with nl excursion in the supine position. No bruits or organomegaly, bowel sounds nl  MS:  warm without deformities, calf tenderness, cyanosis or clubbing  SKIN: warm and dry without lesions    NEURO:  alert, approp, no deficits     CXR PA and Lateral:   09/13/2014 :     I personally reviewed images and agree with radiology impression as follows:    COPD. Bibasilar pneumonia greater on the left than on the right is little changed from the previous study.and dates back to 9/22 /2015        Assessment:

## 2014-09-13 NOTE — Assessment & Plan Note (Signed)
ACE inhibitors are problematic in  pts with airway complaints because  even experienced pulmonologists can't always distinguish ace effects from copd/asthma.  By themselves they don't actually cause a problem, much like oxygen can't by itself start a fire, but they certainly serve as a powerful catalyst or enhancer for any "fire"  or inflammatory process in the upper airway, be it caused by an ET  tube or more commonly reflux (especially in the obese or pts with known GERD or who are on biphoshonates).    In the era of ARB near equivalency until we have a better handle on the reversibility of the airway problem, it just makes sense to avoid ACEI  entirely in the short run  - it's the only way to judge cause and effect  Try d/c lisinopril and start diovan 160 mg daily

## 2014-09-14 LAB — CULTURE, BLOOD (ROUTINE X 2)
Culture: NO GROWTH
Culture: NO GROWTH

## 2014-09-27 ENCOUNTER — Ambulatory Visit (INDEPENDENT_AMBULATORY_CARE_PROVIDER_SITE_OTHER): Payer: PPO | Admitting: Adult Health

## 2014-09-27 ENCOUNTER — Encounter: Payer: Self-pay | Admitting: Adult Health

## 2014-09-27 ENCOUNTER — Other Ambulatory Visit (INDEPENDENT_AMBULATORY_CARE_PROVIDER_SITE_OTHER): Payer: PPO

## 2014-09-27 ENCOUNTER — Ambulatory Visit (INDEPENDENT_AMBULATORY_CARE_PROVIDER_SITE_OTHER)
Admission: RE | Admit: 2014-09-27 | Discharge: 2014-09-27 | Disposition: A | Discharge status: Home / Self Care | Payer: PPO | Source: Ambulatory Visit | Attending: Adult Health | Admitting: Adult Health

## 2014-09-27 VITALS — BP 124/74 | HR 90 | Temp 98.0°F | Ht 70.0 in | Wt 200.0 lb

## 2014-09-27 DIAGNOSIS — J189 Pneumonia, unspecified organism: Secondary | ICD-10-CM

## 2014-09-27 DIAGNOSIS — J449 Chronic obstructive pulmonary disease, unspecified: Secondary | ICD-10-CM

## 2014-09-27 LAB — SEDIMENTATION RATE: SED RATE: 32 mm/h — AB (ref 0–22)

## 2014-09-27 MED ORDER — BUDESONIDE-FORMOTEROL FUMARATE 160-4.5 MCG/ACT IN AERO: 2.0000 | INHALATION_SPRAY | Freq: Two times a day (BID) | RESPIRATORY_TRACT | Status: DC

## 2014-09-27 NOTE — Progress Notes (Signed)
Subjective:     Patient ID: [name], male   DOB: 02/17/1936,   MRN: [id number]  HPI  65 yowm quit smoking at dx of pna 03/2014 maintiained on advair since mid 2000s with confirmed GOLD II copd 11/30/13 referred to pulmonary clinic 09/13/2014  by Dr Darcus Austin for new dx of pna with flare of symptoms Aug 21 2014  Admit date: 09/07/2014 Discharge date: 09/09/2014  PCP: Marjorie Smolder, MD  DISCHARGE DIAGNOSES:  Principal Problem:  CAP (community acquired pneumonia) Active Problems:  Chronic tension headaches  Diabetes mellitus type 2 with complications  Hyponatremia  Hyperlipidemia  COPD (chronic obstructive pulmonary disease)  Leukocytosis  Anemia   RECOMMENDATIONS FOR OUTPATIENT FOLLOW UP: 1. Patient has an appointment with the pulmonologist this Tuesday 2. Monitor blood sugars at home 3. May need further workup for hyponatremia as an outpatient  DISCHARGE CONDITION: fair  Diet recommendation: Modified carbohydrate  Filed Weights   09/07/14 1307  Weight: 88.905 kg (196 lb)    INITIAL HISTORY: 79 year old male of [place] descent with a past medical history of COPD, asthma and diabetes who presented with a 3 week history of worsening shortness of breath and cough. He was initially treated as an outpatient with steroid for bronchitis. With no improvement, he decided to come into the hospital. He was detected to have multilobar pneumonia in the ED. He was subsequently admitted to the hospital.    HOSPITAL COURSE:   Multilobar community-acquired pneumonia Patient was placed on intravenous antibiotics. He improved rather quickly. Cultures have been negative. He is feeling better. He is saturating normal on room air. Strep and legionella antigens are negative. Denies any nausea, vomiting. He was changed over to oral antibiotics. This morning, and then was discharged.          09/13/2014 1st [facility] office visit/ Wert   Chief Complaint  Patient  presents with  . Advice Only    referred by Dr. Darcus Austin, Pneumonia   onset was abrupt on Super bowl Sunday/ cough/congestion and increase need for albterol/ mucus mostly white, never high fever admitted Last levaquin Feb 29, feeling back to baseline = doe with more than slow adls/ chronic dry daytime cough, neither  cough or sob better with saba/ still on advair 250 bid. >>d/c advair and ACE , change to symbicort and diovan   09/27/2014 Follow up : COPD /PNA  Returns for a  2 weeks follow up.  Admitted last month with CAP with multilobar PNA  Was in 03/2014 with CAP . Xray review does not show a film with clearance since Sept 2015.  Was treated with abx and steroids . Seen for pulmonary consult 2 weeks ago.  Changed from Cataract and Lisinopril to Symbicort and Diovan.  Says she is feeling better wl much improved cough . Decreased SABA use.  Likes symbicort .  Minimal cough  CXR done 2 weeks ago with no sign changes in bibasilar PNA L>R .  No weight loss, fever, hemopytsis , chest pain , orthopnea, or edema.    Current Medications, Allergies, Complete Past Medical History, Past Surgical History, Family History, and Social History were reviewed in [employer] record.  ROS  The following are not active complaints unless bolded sore throat, dysphagia, dental problems, itching, sneezing,  nasal congestion or excess/ purulent secretions, ear ache,   fever, chills, sweats, unintended wt loss, pleuritic or exertional cp, hemoptysis,  orthopnea pnd or leg swelling, presyncope, palpitations, heartburn, abdominal pain, anorexia, nausea, vomiting,  diarrhea  or change in bowel or urinary habits, change in stools or urine, dysuria,hematuria,  rash, arthralgias, visual complaints, headache, numbness weakness or ataxia or problems with walking or coordination,  change in mood/affect or memory.     .     Objective:   Physical Exam amb hoarse wm nad     Vital signs  reviewed   HEENT: nl dentition, turbinates, and orophanx. Nl external ear canals without cough reflex   NECK :  without JVD/Nodes/TM/ nl carotid upstrokes bilaterally   LUNGS: no acc muscle use, clear to A and P bilaterally without cough on insp or exp maneuvers   CV:  RRR  no s3 or murmur or increase in P2, no edema   ABD:  soft and nontender with nl excursion in the supine position. No bruits or organomegaly, bowel sounds nl  MS:  warm without deformities, calf tenderness, cyanosis or clubbing  SKIN: warm and dry without lesions    NEURO:  alert, approp, no deficits     CXR PA and Lateral:   09/13/2014 :     I personally reviewed images and agree with radiology impression as follows:    COPD. Bibasilar pneumonia greater on the left than on the right is little changed from the previous study.and dates back to 9/22 /2015        Assessment:

## 2014-09-27 NOTE — Assessment & Plan Note (Signed)
Bibasilar PNA L>R  Clinically improved  ? Resolved totally from 03/2014  Check esr and repeat CXR  ? Lag time vs BOOP  Prev smoker ? If not resolving may need FOB   Plan  Chest xray and labs today . With ESR  follow up Dr. Melvyn Novas  In 4-6 weeks and As needed

## 2014-09-27 NOTE — Patient Instructions (Signed)
Continue on Symbicort 2 puffs Twice daily   Chest xray and labs today .  follow up Dr. Melvyn Novas  In 4-6 weeks and As needed

## 2014-09-27 NOTE — Assessment & Plan Note (Signed)
Improved compensation off ACE and Advair  Avoid Ace inhibitor in future if possible.   Plan  Cont on symbicort.

## 2014-09-27 NOTE — Addendum Note (Signed)
Addended by: Parke Poisson E on: 09/27/2014 04:29 PM   Modules accepted: Orders

## 2014-09-28 NOTE — Progress Notes (Signed)
Chart and xrays reviewed, ov notes reviewed/ clearly doing better off acei/ agree with a/p

## 2014-09-29 ENCOUNTER — Telehealth: Payer: Self-pay | Admitting: Adult Health

## 2014-09-29 NOTE — Telephone Encounter (Signed)
Pt calling requesting lab and cxr results from 09/27/14.  Please advise Tammy. Thanks.

## 2014-09-30 NOTE — Telephone Encounter (Signed)
Pt aware.

## 2014-10-21 ENCOUNTER — Encounter: Payer: Self-pay | Admitting: Interventional Cardiology

## 2014-10-21 ENCOUNTER — Ambulatory Visit (INDEPENDENT_AMBULATORY_CARE_PROVIDER_SITE_OTHER): Payer: PPO | Admitting: Interventional Cardiology

## 2014-10-21 VITALS — BP 122/74 | HR 84 | Ht 70.0 in | Wt 201.1 lb

## 2014-10-21 DIAGNOSIS — I10 Essential (primary) hypertension: Secondary | ICD-10-CM | POA: Diagnosis not present

## 2014-10-21 DIAGNOSIS — R0789 Other chest pain: Secondary | ICD-10-CM | POA: Diagnosis not present

## 2014-10-21 DIAGNOSIS — J449 Chronic obstructive pulmonary disease, unspecified: Secondary | ICD-10-CM

## 2014-10-21 DIAGNOSIS — E785 Hyperlipidemia, unspecified: Secondary | ICD-10-CM

## 2014-10-21 NOTE — Progress Notes (Signed)
Cardiology Office Note   Date:  10/21/2014   ID:  [name], DOB 1936-06-24, MRN [id number]  PCP:  James Smolder, MD  Cardiologist:   James Grooms, MD   No chief complaint on file.     History of Present Illness: [name] is a 79 y.o. male who presents for fatigue. He is referred by Dr. Darcus Maldonado for further evaluation.And specifically questioning the patient, he complains of feeling very fatigued and sluggish first thing in the morning. He goes to sleep around midnight each day. He denies chest pain and dyspnea. There is no orthopnea. His wife was present during the interview states that he does snore. She has never seen him have apnea. He sleeps usually from about midnight to 6 5:30 AM. He has frequent urination at night.  He is relatively active. He does stationary biking 30 minutes every day. He has done this for quite some time and is noticed no change in his exertional tolerance.    Past Medical History  Diagnosis Date  . Asthma   . Bronchitis   . Diabetes mellitus   . Hypertension   . Skull fracture   . Iliac artery aneurysm   . Peripheral neuropathy   . Hypercholesterolemia   . Subdural hematoma   . Progressive gait disorder   . 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 COPD (chronic obstructive pulmonary disease)   . Hematoma     left subdoral  . Macular degeneration   . Lung nodule   . Degenerative cervical disc     No past surgical history on file.   Current Outpatient Prescriptions  Medication Sig Dispense Refill  . albuterol (PROVENTIL HFA;VENTOLIN HFA) 108 (90 BASE) MCG/ACT inhaler Inhale 2 puffs into the lungs every 6 (six) hours as needed for wheezing or shortness of breath.    . Ascorbic Acid (VITAMIN C PO) Take 1 tablet by mouth daily.    Marland Kitchen aspirin 81 MG tablet Take 81 mg by mouth daily.    Marland Kitchen atorvastatin (LIPITOR) 10 MG tablet Take 10 mg by mouth daily.    . budesonide-formoterol (SYMBICORT) 160-4.5 MCG/ACT inhaler Inhale 2  puffs into the lungs 2 (two) times daily. 10.2 g 5  . cetirizine (ZYRTEC) 10 MG tablet Take 10 mg by mouth as needed for allergies.    . Cyanocobalamin (VITAMIN B 12 PO) Take 500 mg by mouth daily.    . finasteride (PROSCAR) 5 MG tablet Take 5 mg by mouth daily.    Marland Kitchen glipiZIDE (GLUCOTROL XL) 5 MG 24 hr tablet Take 5 mg by mouth daily with breakfast. x30 days per PCP    . metFORMIN (GLUCOPHAGE) 500 MG tablet Take 1 tablet (500 mg total) by mouth 2 (two) times daily with a meal. Resume tonight.    . Multiple Vitamin (MULTIVITAMIN WITH MINERALS) TABS Take 1 tablet by mouth daily.    . sildenafil (VIAGRA) 50 MG tablet Take 50 mg by mouth daily as needed for erectile dysfunction.    . Tamsulosin HCl (FLOMAX) 0.4 MG CAPS Take 0.4 mg by mouth daily.    . valsartan (DIOVAN) 160 MG tablet Take 1 tablet (160 mg total) by mouth daily. 30 tablet 11   No current facility-administered medications for this visit.    Allergies:   Pneumovax 23; Prednisone; Prevnar 13; Penicillins; and Sulfa antibiotics    Social History:  The patient  reports that he quit smoking about 6 months ago. His smoking use included Cigarettes. He has a  30 pack-year smoking history. He has never used smokeless tobacco. He reports that he drinks about 7.0 oz of alcohol per week. He reports that he does not use illicit drugs.   Family History:  The patient's family history includes Stroke in his father.  There is no family history of coronary disease.    ROS:  Please see the history of present illness.   Otherwise, review of systems are positive for snores. Has neck discomfort. Stiffness in the neck. Has lower back discomfort. Has a drink of alcohol each evening..   All other systems are reviewed and negative.    PHYSICAL EXAM: VS:  BP 122/74 mmHg  Pulse 84  Ht 5\' 10"  (1.778 m)  Wt 201 lb 1.9 oz (91.227 kg)  BMI 28.86 kg/m2 , BMI Body mass index is 28.86 kg/(m^2). GEN: Well nourished, well developed, in no acute distress HEENT:  normal Neck: no JVD, carotid bruits, or masses Cardiac: RRR; no murmurs, rubs, or gallops,no edema  Respiratory:  clear to auscultation bilaterally, normal work of breathing GI: soft, nontender, nondistended, + BS MS: no deformity or atrophy Skin: warm and dry, no rash Neuro:  Strength and sensation are intact Psych: euthymic mood, full affect   EKG:  EKG is ordered today. The ekg ordered today demonnormal sinus rhythm and overall normal appearing tracing  Recent Labs: 09/07/2014: ALT 65*; B Natriuretic Peptide 18.9; TSH 1.303 09/08/2014: Magnesium 2.0 09/09/2014: BUN 14; Creatinine 0.56; Hemoglobin 11.3*; Platelets 371; Potassium 4.9; Sodium 131*    Lipid Panel No results found for: CHOL, TRIG, HDL, CHOLHDL, VLDL, LDLCALC, LDLDIRECT    Wt Readings from Last 3 Encounters:  10/21/14 201 lb 1.9 oz (91.227 kg)  09/27/14 200 lb (90.719 kg)  09/13/14 197 lb 3.2 oz (89.449 kg)      Other studies Reviewed: Additional studies/ records that were reviewed today incl[facility]viewed [facility] records identified relatively recent tobacco use, diabetes, and hypertension as medical problems.. Review of the above records demonstrates: above   ASSESSMENT AND PLAN:  Fatigue: Of unOf uncertain. We considered exercise testing but this did not seem reasonable since none of his complaints have anything to do with exertion.   Snoring:    sleep apnea could be the cause of the patient's fatigue.   COPD GOLD II: CONTROLLED   Hyperlipidemia: On therapy   Essential hypertension: Controlled   Diabetes mellitus type 2 with complications     Current medicines are reviewed at length with the patient today.  The patient does not have concerns regarding medicines.  The following changes have been made:  consider a sleep study to rule out sleep apnea as the source of fatigue.   Labs/ tests ordered today incluneed to consider a sleep study No orders of the defined types were placed in this encounter.      Disposition:   FU with Linard Millers as needed   Signed, James Grooms, MD  10/21/2014 4:03 PM    [facility] [address], [place], [place]  [address] Phone: [phone]; Fax: [phone]

## 2014-10-21 NOTE — Patient Instructions (Signed)
Your physician recommends that you continue on your current medications as directed. Please refer to the Current Medication list given to you today.  Your physician recommends that you schedule a follow-up appointment with Dr.Gates  Your physician recommends that you schedule a follow-up appointment as needed with Dr.Smith

## 2014-10-31 ENCOUNTER — Encounter: Payer: Self-pay | Admitting: Adult Health

## 2014-10-31 ENCOUNTER — Ambulatory Visit (INDEPENDENT_AMBULATORY_CARE_PROVIDER_SITE_OTHER)
Admission: RE | Admit: 2014-10-31 | Discharge: 2014-10-31 | Disposition: A | Discharge status: Home / Self Care | Payer: PPO | Source: Ambulatory Visit | Attending: Pulmonary Disease | Admitting: Pulmonary Disease

## 2014-10-31 ENCOUNTER — Ambulatory Visit (INDEPENDENT_AMBULATORY_CARE_PROVIDER_SITE_OTHER): Payer: PPO | Admitting: Adult Health

## 2014-10-31 VITALS — BP 132/88 | HR 92 | Temp 97.7°F | Ht 69.0 in | Wt 202.6 lb

## 2014-10-31 DIAGNOSIS — J189 Pneumonia, unspecified organism: Secondary | ICD-10-CM

## 2014-10-31 DIAGNOSIS — J449 Chronic obstructive pulmonary disease, unspecified: Secondary | ICD-10-CM | POA: Diagnosis not present

## 2014-10-31 MED ORDER — AZITHROMYCIN 250 MG PO TABS: ORAL_TABLET | ORAL | Status: DC

## 2014-10-31 NOTE — Patient Instructions (Signed)
Mucinex DM Twice daily  As needed  Cough and congestion .  Fluids and rest  Zpack to have on hold if symptoms worsen with discolored mucus .  follow up Dr. Melvyn Novas  In 4-6 weeks and .As needed

## 2014-11-01 ENCOUNTER — Ambulatory Visit: Payer: PPO | Admitting: Internal Medicine

## 2014-11-04 NOTE — Assessment & Plan Note (Signed)
Stable on therapy 

## 2014-11-04 NOTE — Assessment & Plan Note (Signed)
Clinically pt has improve cXR shows improvement  Will need serial follow up for clearance   Plan  Mucinex DM Twice daily  As needed  Cough and congestion .  Fluids and rest  Zpack to have on hold if symptoms worsen with discolored mucus .  follow up Dr. Melvyn Novas  In 4-6 weeks and .As needed

## 2014-11-04 NOTE — Progress Notes (Signed)
Subjective:     Patient ID: [name], male   DOB: Jan 27, 1936,   MRN: [id number]  HPI  60 yowm quit smoking at dx of pna 03/2014 maintiained on advair since mid 2000s with confirmed GOLD II copd 11/30/13 referred to pulmonary clinic 09/13/2014  by Dr Darcus Austin for new dx of pna with flare of symptoms Aug 21 2014  Admit date: 09/07/2014 Discharge date: 09/09/2014  PCP: Marjorie Smolder, MD  DISCHARGE DIAGNOSES:  Principal Problem:  CAP (community acquired pneumonia) Active Problems:  Chronic tension headaches  Diabetes mellitus type 2 with complications  Hyponatremia  Hyperlipidemia  COPD (chronic obstructive pulmonary disease)  Leukocytosis  Anemia   RECOMMENDATIONS FOR OUTPATIENT FOLLOW UP: 1. Patient has an appointment with the pulmonologist this Tuesday 2. Monitor blood sugars at home 3. May need further workup for hyponatremia as an outpatient  DISCHARGE CONDITION: fair  Diet recommendation: Modified carbohydrate  Filed Weights   09/07/14 1307  Weight: 88.905 kg (196 lb)    INITIAL HISTORY: 79 year old male of [place] descent with a past medical history of COPD, asthma and diabetes who presented with a 3 week history of worsening shortness of breath and cough. He was initially treated as an outpatient with steroid for bronchitis. With no improvement, he decided to come into the hospital. He was detected to have multilobar pneumonia in the ED. He was subsequently admitted to the hospital.    HOSPITAL COURSE:   Multilobar community-acquired pneumonia Patient was placed on intravenous antibiotics. He improved rather quickly. Cultures have been negative. He is feeling better. He is saturating normal on room air. Strep and legionella antigens are negative. Denies any nausea, vomiting. He was changed over to oral antibiotics. This morning, and then was discharged.          09/13/2014 1st [facility] office visit/ Wert   Chief Complaint  Patient  presents with  . Advice Only    referred by Dr. Darcus Austin, Pneumonia   onset was abrupt on Super bowl Sunday/ cough/congestion and increase need for albterol/ mucus mostly white, never high fever admitted Last levaquin Feb 29, feeling back to baseline = doe with more than slow adls/ chronic dry daytime cough, neither  cough or sob better with saba/ still on advair 250 bid. >>d/c advair and ACE , change to symbicort and diovan   09/27/2014 Follow up : COPD /PNA  Returns for a  2 weeks follow up.  Admitted last month with CAP with multilobar PNA  Was in 03/2014 with CAP . Xray review does not show a film with clearance since Sept 2015.  Was treated with abx and steroids . Seen for pulmonary consult 2 weeks ago.  Changed from [place] and Lisinopril to Symbicort and Diovan.  Says she is feeling better wl much improved cough . Decreased SABA use.  Likes symbicort .  Minimal cough  CXR done 2 weeks ago with no sign changes in bibasilar PNA L>R .  No weight loss, fever, hemopytsis , chest pain , orthopnea, or edema.  >>no changes   10/31/14 Folllow up PNA /COPD  Pt returns for a check up.  Seen last ov for a post hospital follow up .  Admitted for CAP with multilobar PNA.  tx w/ abx and steroids.  He is feeling much better. Gotten back to his exercise.  However started having some increased cough and congestion that is white over last 2-3 days.  Wanted to make sure he is not developing PNA again.  ESR last ov was slightly up at 32.  No chest pain, orthopnea, edema , hemoptysis , fever or discolored mucus.      Current Medications, Allergies, Complete Past Medical History, Past Surgical History, Family History, and Social History were reviewed in [employer] record.  ROS  The following are not active complaints unless bolded sore throat, dysphagia, dental problems, itching, sneezing,  nasal congestion or excess/ purulent secretions, ear ache,   fever, chills,  sweats, unintended wt loss, pleuritic or exertional cp, hemoptysis,  orthopnea pnd or leg swelling, presyncope, palpitations, heartburn, abdominal pain, anorexia, nausea, vomiting, diarrhea  or change in bowel or urinary habits, change in stools or urine, dysuria,hematuria,  rash, arthralgias, visual complaints, headache, numbness weakness or ataxia or problems with walking or coordination,  change in mood/affect or memory.     .     Objective:   Physical Exam amb wm nad     Vital signs reviewed   HEENT: nl dentition, turbinates, and orophanx. Nl external ear canals without cough reflex   NECK :  without JVD/Nodes/TM/ nl carotid upstrokes bilaterally   LUNGS: no acc muscle use, clear to A and P bilaterally without cough on insp or exp maneuvers   CV:  RRR  no s3 or murmur or increase in P2, no edema   ABD:  soft and nontender with nl excursion in the supine position. No bruits or organomegaly, bowel sounds nl  MS:  warm without deformities, calf tenderness, cyanosis or clubbing  SKIN: warm and dry without lesions    NEURO:  alert, approp, no deficits     CXR PA and Lateral:   10/31/14  Reviewed independently Persistent coarse lung markings at the bases, left greater than right. Findings are consistent with improving infiltrate versus scarring.    Assessment:

## 2014-11-07 ENCOUNTER — Ambulatory Visit: Payer: PPO | Admitting: Internal Medicine

## 2014-11-28 ENCOUNTER — Ambulatory Visit (INDEPENDENT_AMBULATORY_CARE_PROVIDER_SITE_OTHER): Payer: PPO | Admitting: Internal Medicine

## 2014-11-28 ENCOUNTER — Encounter: Payer: Self-pay | Admitting: Internal Medicine

## 2014-11-28 ENCOUNTER — Ambulatory Visit (INDEPENDENT_AMBULATORY_CARE_PROVIDER_SITE_OTHER)
Admission: RE | Admit: 2014-11-28 | Discharge: 2014-11-28 | Disposition: A | Discharge status: Home / Self Care | Payer: PPO | Source: Ambulatory Visit | Attending: Internal Medicine | Admitting: Internal Medicine

## 2014-11-28 ENCOUNTER — Ambulatory Visit: Payer: PPO | Admitting: Internal Medicine

## 2014-11-28 VITALS — BP 120/70 | HR 82 | Ht 70.0 in | Wt 207.4 lb

## 2014-11-28 DIAGNOSIS — J449 Chronic obstructive pulmonary disease, unspecified: Secondary | ICD-10-CM

## 2014-11-28 DIAGNOSIS — J189 Pneumonia, unspecified organism: Secondary | ICD-10-CM

## 2014-11-28 DIAGNOSIS — I10 Essential (primary) hypertension: Secondary | ICD-10-CM | POA: Diagnosis not present

## 2014-11-28 NOTE — Progress Notes (Signed)
Subjective:     Patient ID: [name], male   DOB: [birth year],   MRN: [id number]   Brief patient profile:  29 yowm quit smoking at dx of pna 03/2014 maintiained on advair since mid 2000s with confirmed GOLD II copd 11/30/13 referred to pulmonary clinic 09/13/2014  by Dr Darcus Austin for new dx of pna with flare of symptoms Aug 21 2014  Admit date: 09/07/2014 Discharge date: 09/09/2014  PCP: Marjorie Smolder, MD  DISCHARGE DIAGNOSES:  Principal Problem:  CAP (community acquired pneumonia) Active Problems:  Chronic tension headaches  Diabetes mellitus type 2 with complications  Hyponatremia  Hyperlipidemia  COPD (chronic obstructive pulmonary disease)  Leukocytosis  Anemia   RECOMMENDATIONS FOR OUTPATIENT FOLLOW UP: 1. Patient has an appointment with the pulmonologist this Tuesday 2. Monitor blood sugars at home 3. May need further workup for hyponatremia as an outpatient  DISCHARGE CONDITION: fair  Diet recommendation: Modified carbohydrate  Filed Weights   09/07/14 1307  Weight: 88.905 kg (196 lb)    INITIAL HISTORY: 79 year old male of [place] descent with a past medical history of COPD, asthma and diabetes who presented with a 3 week history of worsening shortness of breath and cough. He was initially treated as an outpatient with steroid for bronchitis. With no improvement, he decided to come into the hospital. He was detected to have multilobar pneumonia in the ED. He was subsequently admitted to the hospital.    HOSPITAL COURSE:   Multilobar community-acquired pneumonia Patient was placed on intravenous antibiotics. He improved rather quickly. Cultures have been negative. He is feeling better. He is saturating normal on room air. Strep and legionella antigens are negative. Denies any nausea, vomiting. He was changed over to oral antibiotics. This morning, and then was discharged.          09/13/2014 1st [facility] office visit/ Hoyle Barkdull   Chief  Complaint  Patient presents with  . Advice Only    referred by Dr. Darcus Austin, Pneumonia   onset was abrupt on Super bowl Sunday/ cough/congestion and increase need for albterol/ mucus mostly white, never high fever admitted Last levaquin Feb 29, feeling back to baseline = doe with more than slow adls/ chronic dry daytime cough, neither  cough or sob better with saba/ still on advair 250 bid. >>d/c advair and ACE , change to symbicort and diovan   09/27/2014 Follow up : COPD /PNA  Returns for a  2 weeks follow up.  Admitted last month with CAP with multilobar PNA  Was in 03/2014 with CAP . Xray review does not show a film with clearance since Sept 2015.  Was treated with abx and steroids . Seen for pulmonary consult 2 weeks ago.  Changed from [place] and Lisinopril to Symbicort and Diovan.  Says she is feeling better wl much improved cough . Decreased SABA use.  Likes symbicort .  Minimal cough  CXR done 2 weeks ago with no sign changes in bibasilar PNA L>R .  No weight loss, fever, hemopytsis , chest pain , orthopnea, or edema.  >>no changes   10/31/14 Folllow up PNA /COPD  Pt returns for a check up.  Seen last ov for a post hospital follow up .  Admitted for CAP with multilobar PNA.  tx w/ abx and steroids.  He is feeling much better. Gotten back to his exercise.  However started having some increased cough and congestion that is white over last 2-3 days.  Wanted to make sure he is not developing  PNA again.  ESR last ov was slightly up at 32.  No chest pain, orthopnea, edema , hemoptysis , fever or discolored mucus.  rec Mucinex DM Twice daily  As needed  Cough and congestion .  Fluids and rest  Zpack to have on hold if symptoms worsen with discolored mucus .     11/28/2014 f/u ov/Nick Stults re: GOLD II copd Dellis Anes 160 2bid  Chief Complaint  Patient presents with  . Follow-up    Pt states breathing is overall doing well. Still has some cough- prod with minimal white sputum.  He  has only used albuterol 1-2 x in the past month.   Not limited by breathing from desired activities   Mostly cough in am/ rarely wakes him up   No obvious day to day or daytime variabilty or assoc cp or chest tightness, subjective wheeze overt sinus or hb symptoms. No unusual exp hx or h/o childhood pna/ asthma or knowledge of premature birth.  Sleeping ok without nocturnal  or early am exacerbation  of respiratory  c/o's or need for noct saba. Also denies any obvious fluctuation of symptoms with weather or environmental changes or other aggravating or alleviating factors except as outlined above   Current Medications, Allergies, Complete Past Medical History, Past Surgical History, Family History, and Social History were reviewed in [employer] record.  ROS  The following are not active complaints unless bolded sore throat, dysphagia, dental problems, itching, sneezing,  nasal congestion or excess/ purulent secretions, ear ache,   fever, chills, sweats, unintended wt loss, pleuritic or exertional cp, hemoptysis,  orthopnea pnd or leg swelling, presyncope, palpitations, heartburn, abdominal pain, anorexia, nausea, vomiting, diarrhea  or change in bowel or urinary habits, change in stools or urine, dysuria,hematuria,  rash, arthralgias, visual complaints, headache, numbness weakness or ataxia or problems with walking or coordination,  change in mood/affect or memory.               Objective:   Physical Exam  amb wm nad     Wt Readings from Last 3 Encounters:  11/28/14 207 lb 6.4 oz (94.076 kg)  10/31/14 202 lb 9.6 oz (91.899 kg)  10/21/14 201 lb 1.9 oz (91.227 kg)    Vital signs reviewed    HEENT: nl dentition, turbinates, and orophanx. Nl external ear canals without cough reflex   NECK :  without JVD/Nodes/TM/ nl carotid upstrokes bilaterally   LUNGS: no acc muscle use, clear to A and P bilaterally without cough on insp or exp maneuvers   CV:  RRR  no  s3 or murmur or increase in P2, no edema   ABD:  soft and nontender with nl excursion in the supine position. No bruits or organomegaly, bowel sounds nl  MS:  warm without deformities, calf tenderness, cyanosis or clubbing  SKIN: warm and dry without lesions    NEURO:  alert, approp, no deficits    CXR PA and Lateral:   11/28/2014 :     I personally reviewed images and agree with radiology impression as follows:    Stable COPD and bibasilar scarring. No active disease.      Assessment:

## 2014-11-28 NOTE — Progress Notes (Signed)
Quick Note:  LMOM with results ______ 

## 2014-11-28 NOTE — Patient Instructions (Addendum)
Work on inhaler technique:  relax and gently blow all the way out then take a nice smooth deep breath back in, triggering the inhaler at same time you start breathing in.  Hold for up to 5 seconds if you can.  Rinse and gargle with water when done  Add pepcid 20 mg one at bedtime until am cough is gone   GERD (REFLUX)  is an extremely common cause of respiratory symptoms just like yours , many times with no obvious heartburn at all.    It can be treated with medication, but also with lifestyle changes including place blocks at head of bedtime avoidance of late meals, excessive alcohol, smoking cessation, and avoid fatty foods, chocolate, peppermint, colas, red wine, and acidic juices such as orange juice.  NO MINT OR MENTHOL PRODUCTS SO NO COUGH DROPS  USE SUGARLESS CANDY INSTEAD (Jolley ranchers or Stover's or Life Savers) or even ice chips will also do - the key is to swallow to prevent all throat clearing. NO OIL BASED VITAMINS - use powdered substitutes.  Please remember to go to the  x-ray department downstairs for your tests - we will call you with the results when they are available.   If you are satisfied with your treatment plan,  let your doctor know and he/she can either refill your medications or you can return here when your prescription runs out.     If in any way you are not 100% satisfied,  please tell us.  If 100% better, tell your friends!  Pulmonary follow up is as needed

## 2014-11-29 ENCOUNTER — Encounter: Payer: Self-pay | Admitting: Internal Medicine

## 2014-11-29 NOTE — Assessment & Plan Note (Signed)
11/30/13  FEV1  1.76(57%) ratio 58 and no better p saba, dlco 78% 09/13/2014 stop acei   The proper method of use, as well as anticipated side effects, of a metered-dose inhaler are discussed and demonstrated to the patient. Improved effectiveness after extensive coaching during this visit to a level of approximately  75%    I had an extended final summary discussion with the patient reviewing all relevant studies completed to date and  lasting 15 to 20 minutes of a 25 minute visit on the following issues:    Much better off ace with minimal am cough which may be noct gerd > try pepcid 20 mg qhs  Copd unlikely to progress unless resumes smoking  symbicort adequate "monotherapy" for now  Pulmonary f/u is prn

## 2014-11-29 NOTE — Assessment & Plan Note (Signed)
cxr cleared as of 5/161/6 > no further f/u planned

## 2014-11-29 NOTE — Assessment & Plan Note (Signed)
Try off acei 09/13/2014 due to pseudowheeze > resolved with adequate bp control on diovan 160 mg daily      Would avoid acei in this setting (non specific upper airway symptoms)

## 2014-12-30 ENCOUNTER — Ambulatory Visit (INDEPENDENT_AMBULATORY_CARE_PROVIDER_SITE_OTHER): Payer: PPO | Admitting: Podiatry

## 2014-12-30 ENCOUNTER — Encounter: Payer: Self-pay | Admitting: Podiatry

## 2014-12-30 VITALS — BP 147/85 | HR 97 | Resp 18

## 2014-12-30 DIAGNOSIS — E1140 Type 2 diabetes mellitus with diabetic neuropathy, unspecified: Secondary | ICD-10-CM

## 2014-12-30 DIAGNOSIS — B351 Tinea unguium: Secondary | ICD-10-CM

## 2014-12-30 DIAGNOSIS — M79676 Pain in unspecified toe(s): Secondary | ICD-10-CM

## 2014-12-30 DIAGNOSIS — M79675 Pain in left toe(s): Secondary | ICD-10-CM

## 2014-12-30 DIAGNOSIS — M79674 Pain in right toe(s): Secondary | ICD-10-CM

## 2014-12-31 NOTE — Progress Notes (Signed)
Patient ID: [name], male   DOB: [birth year], 79 y.o.   MRN: [id number] Complaint:  Visit Type: Patient returns to my office for continued preventative foot care services. Complaint: Patient states" my nails have grown long and thick and become painful to walk and wear shoes" Patient has been diagnosed with DM with neuropathy. He presents for preventative foot care services. No changes to ROS  Podiatric Exam: Vascular: dorsalis pedis and posterior tibial pulses are palpable bilateral. Capillary return is immediate. Temperature gradient is WNL. Skin turgor WNL  Sensorium: Diminishedl Semmes Weinstein monofilament test. Normal tactile sensation bilaterally. Nail Exam: Pt has thick disfigured discolored nails with subungual debris noted bilateral entire nail hallux through fifth toenails Ulcer Exam: There is no evidence of ulcer or pre-ulcerative changes or infection. Orthopedic Exam: Muscle tone and strength are WNL. No limitations in general ROM. No crepitus or effusions noted. Foot type and digits show no abnormalities. Bony prominences are unremarkable. Skin: No Porokeratosis. No infection or ulcers  Diagnosis:  Tinea unguium, Pain in right toe, pain in left toes  Treatment & Plan Procedures and Treatment: Consent by patient was obtained for treatment procedures. The patient understood the discussion of treatment and procedures well. All questions were answered thoroughly reviewed. Debridement of mycotic and hypertrophic toenails, 1 through 5 bilateral and clearing of subungual debris. No ulceration, no infection noted.  Return Visit-Office Procedure: Patient instructed to return to the office for a follow up visit 3 months for continued evaluation and treatment.

## 2015-02-14 ENCOUNTER — Encounter: Payer: Self-pay | Admitting: Podiatry

## 2015-02-14 ENCOUNTER — Ambulatory Visit (INDEPENDENT_AMBULATORY_CARE_PROVIDER_SITE_OTHER): Payer: PPO | Admitting: Podiatry

## 2015-02-14 VITALS — BP 140/87 | HR 84 | Resp 14

## 2015-02-14 DIAGNOSIS — L600 Ingrowing nail: Secondary | ICD-10-CM

## 2015-02-14 DIAGNOSIS — L03032 Cellulitis of left toe: Secondary | ICD-10-CM | POA: Diagnosis not present

## 2015-02-14 NOTE — Progress Notes (Signed)
   Subjective:    Patient ID: [name], male    DOB: 1935-07-30, 79 y.o.   MRN: [id number]  HPI Patient is here today with left great toe pain, which has been hurting with touch for 2 weeks and is getting worse. He has treated it with antibacterial ointment, seems to help. This patient returns to the office with painful outside border left big toe.He says the pain is worsening over time.  Pain upon walking and wearing his shoes.  He has drainage at the tip of the nail left foot. Review of Systems  All other systems reviewed and are negative.      Objective:   Physical Exam GENERAL APPEARANCE: Alert, conversant. Appropriately groomed. No acute distress.  VASCULAR: Pedal pulses palpable at 2/4  bilateral.  Capillary refill time is immediate to all digits,  Proximal to distal cooling it warm to warm.   NEUROLOGIC: sensation is intact epicritically and protectively to 5.07 monofilament at 5/5 sites bilateral.  Light touch is intact bilateral, vibratory sensation intact bilateral, achilles tendon reflex is intact bilateral.  MUSCULOSKELETAL: acceptable muscle strength, tone and stability bilateral.  Intrinsic muscluature intact bilateral.  Rectus appearance of foot and digits noted bilateral.   DERMATOLOGIC: skin color, texture, and turgor are within normal limits.  No preulcerative lesions or ulcers  are seen, no interdigital maceration noted.  No open lesions present.   NAILS  There is red swollen area at the distal aspect lateral border left great toe left foot.         Assessment & Plan:  Paronychia lateral border left great toe.  ROV  Discussed condition with patient and he is not prepared for nail surgery now.  He requests surgery be performed tomorrow morning at [facility].

## 2015-02-15 ENCOUNTER — Ambulatory Visit (INDEPENDENT_AMBULATORY_CARE_PROVIDER_SITE_OTHER): Payer: PPO | Admitting: Podiatry

## 2015-02-15 ENCOUNTER — Encounter: Payer: Self-pay | Admitting: Podiatry

## 2015-02-15 VITALS — BP 144/83 | HR 89 | Resp 18

## 2015-02-15 DIAGNOSIS — L03032 Cellulitis of left toe: Secondary | ICD-10-CM

## 2015-02-15 DIAGNOSIS — L600 Ingrowing nail: Secondary | ICD-10-CM | POA: Diagnosis not present

## 2015-02-15 NOTE — Progress Notes (Signed)
Subjective:     Patient ID: [name], male   DOB: [birth year], 79 y.o.   MRN: [id number]  HPIThis patient presents to the office for scheduled surgery for correction paronychia lateral side left big toe.   Review of Systems     Objective:   Physical Exam GENERAL APPEARANCE: Alert, conversant. Appropriately groomed. No acute distress.  VASCULAR: Pedal pulses palpable at 2/4 DP and PT bilateral.  Capillary refill time is immediate to all digits,  Proximal to distal cooling it warm to warm.  Digital hair growth is present bilateral  NEUROLOGIC: sensation is intact epicritically and protectively to 5.07 monofilament at 5/5 sites bilateral.  Light touch is intact bilateral, vibratory sensation intact bilateral, achilles tendon reflex is intact bilateral.  MUSCULOSKELETAL: acceptable muscle strength, tone and stability bilateral.  Intrinsic muscluature intact bilateral.  Rectus appearance of foot and digits noted bilateral.   DERMATOLOGIC: skin color, texture, and turgor are within normal limits.  No preulcerative lesions or ulcers  are seen, no interdigital maceration noted.  No open lesions present.  Pain redness and swelling noted lateral border left great toe      Assessment:     Paronychia lateral border left great toe.     Plan:     Nail surgery left hallux.  Treatment options and alternatives discussed.  Recommended permanent phenol matrixectomy and patient agreed.  Left hallux  was prepped with alcohol and a toe block of 3cc of 2% lidocaine plain was administered in a .  The toe was then prepped with betadine solution .  The offending nail border was then excised and matrix tissue exposed.  Phenol was then applied to the matrix tissue followed by an alcohol wash.  Antibiotic ointment and a dry sterile dressing was applied.  The patient was dispensed instructions for aftercare.  RTC 1 week.

## 2015-02-22 ENCOUNTER — Ambulatory Visit (INDEPENDENT_AMBULATORY_CARE_PROVIDER_SITE_OTHER): Payer: PPO | Admitting: Podiatry

## 2015-02-22 ENCOUNTER — Encounter: Payer: Self-pay | Admitting: Podiatry

## 2015-02-22 VITALS — BP 139/85 | HR 94 | Resp 18

## 2015-02-22 DIAGNOSIS — Z09 Encounter for follow-up examination after completed treatment for conditions other than malignant neoplasm: Secondary | ICD-10-CM

## 2015-02-22 NOTE — Progress Notes (Signed)
Subjective:     Patient ID: [name], male   DOB: 10-30-1935, 79 y.o.   MRN: [id number]  HPIThis patient presents to office one week after perfoming nail surgery outside border left big toe.  He says he has been soaking and bandaging his toe as directed.  He says no pain or discomfort or drainage noted.   Review of Systems     Objective:   Physical Exam GENERAL APPEARANCE: Alert, conversant. Appropriately groomed. No acute distress.  VASCULAR: Pedal pulses palpable at  [facility] and PT bilateral.  Capillary refill time is immediate to all digits,  Normal temperature gradient.  Digital hair growth is present bilateral  NEUROLOGIC: sensation is normal to 5.07 monofilament at 5/5 sites bilateral.  Light touch is intact bilateral, Muscle strength normal.  MUSCULOSKELETAL: acceptable muscle strength, tone and stability bilateral.  Intrinsic muscluature intact bilateral.  Rectus appearance of foot and digits noted bilateral.   DERMATOLOGIC: skin color, texture, and turgor are within normal limits.  No preulcerative lesions or ulcers  are seen, no interdigital maceration noted.  No open lesions present.  Digital nails are asymptomatic. No drainage noted. NAILS  There is healing noted along lateral border left big toe.  No drainage or infection noted.     Assessment:     S/p nail surgery     Plan:     ROV  Home instructiuons given.  RTC 2 weeks for nail care.

## 2015-03-03 ENCOUNTER — Ambulatory Visit (INDEPENDENT_AMBULATORY_CARE_PROVIDER_SITE_OTHER): Payer: PPO | Admitting: Podiatry

## 2015-03-03 ENCOUNTER — Encounter: Payer: Self-pay | Admitting: Podiatry

## 2015-03-03 VITALS — BP 138/77 | HR 97 | Resp 17

## 2015-03-03 DIAGNOSIS — B351 Tinea unguium: Secondary | ICD-10-CM | POA: Diagnosis not present

## 2015-03-03 DIAGNOSIS — M79676 Pain in unspecified toe(s): Secondary | ICD-10-CM

## 2015-03-03 DIAGNOSIS — E1140 Type 2 diabetes mellitus with diabetic neuropathy, unspecified: Secondary | ICD-10-CM

## 2015-03-04 NOTE — Progress Notes (Signed)
Subjective:     Patient ID: [name], male   DOB: 21-May-1936, 79 y.o.   MRN: [id number]  HPIThis patient presents saying his nails have grown thick and long since his last visit.  His nails are painful walking and wearing his shoes.  His ingrown toenail surgery healing well.  He is diabetic diagnosed with neuropathy by his doctor.   Review of Systems     Objective:   Physical Exam GENERAL APPEARANCE: Alert, conversant. Appropriately groomed. No acute distress.  VASCULAR: Pedal pulses palpable at  [facility] and PT bilateral.  Capillary refill time is immediate to all digits,  Normal temperature gradient.  Digital hair growth is present bilateral  NEUROLOGIC: sensation is normal to 5.07 monofilament at 5/5 sites bilateral.  Light touch is intact bilateral, Muscle strength normal.  MUSCULOSKELETAL: acceptable muscle strength, tone and stability bilateral.  Intrinsic muscluature intact bilateral.  Rectus appearance of foot and digits noted bilateral.   DERMATOLOGIC: skin color, texture, and turgor are within normal limits.  No preulcerative lesions or ulcers  are seen, no interdigital maceration noted.  No open lesions present.  . No drainage noted. NAILS  Thick disfigured discolored nails both feet.  Healing noted along lateral border left hallux.      Assessment:     Onychomycosis     Plan:     Debridement of nails.

## 2015-04-20 ENCOUNTER — Encounter: Payer: Self-pay | Admitting: Internal Medicine

## 2015-04-20 ENCOUNTER — Telehealth: Payer: Self-pay | Admitting: Internal Medicine

## 2015-04-20 ENCOUNTER — Ambulatory Visit (INDEPENDENT_AMBULATORY_CARE_PROVIDER_SITE_OTHER): Payer: PPO | Admitting: Internal Medicine

## 2015-04-20 VITALS — BP 138/88 | HR 92 | Ht 70.0 in | Wt 212.0 lb

## 2015-04-20 DIAGNOSIS — J449 Chronic obstructive pulmonary disease, unspecified: Secondary | ICD-10-CM

## 2015-04-20 DIAGNOSIS — R058 Other specified cough: Secondary | ICD-10-CM

## 2015-04-20 DIAGNOSIS — R05 Cough: Secondary | ICD-10-CM | POA: Diagnosis not present

## 2015-04-20 MED ORDER — AZITHROMYCIN 250 MG PO TABS: ORAL_TABLET | ORAL | Status: DC

## 2015-04-20 NOTE — Telephone Encounter (Signed)
Spoke with pt's wife, [name]. She is aware that we will send in this medication. Nothing further was needed.

## 2015-04-20 NOTE — Progress Notes (Signed)
Subjective:     Patient ID: [name], male   DOB: Dec 03, 1935,   MRN: [id number]    Brief patient profile:  20 yowm quit smoking at dx of pna 03/2014 maintiained on advair since mid 2000s with confirmed GOLD II copd 11/30/13 referred to pulmonary clinic 09/13/2014  by Dr Darcus Austin for new dx of pna with flare of symptoms Aug 21 2014  Admit date: 09/07/2014 Discharge date: 09/09/2014  PCP: Marjorie Smolder, MD  DISCHARGE DIAGNOSES:  Principal Problem:  CAP (community acquired pneumonia) Active Problems:  Chronic tension headaches  Diabetes mellitus type 2 with complications  Hyponatremia  Hyperlipidemia  COPD (chronic obstructive pulmonary disease)  Leukocytosis  Anemia      Filed Weights   09/07/14 1307  Weight: 88.905 kg (196 lb)    INITIAL HISTORY: 79 year old male of [place] descent with a past medical history of COPD, asthma and diabetes who presented with a 3 week history of worsening shortness of breath and cough. He was initially treated as an outpatient with steroid for bronchitis. With no improvement, he decided to come into the hospital. He was detected to have multilobar pneumonia in the ED. He was subsequently admitted to the hospital.    HOSPITAL COURSE:   Multilobar community-acquired pneumonia Patient was placed on intravenous antibiotics. He improved rather quickly. Cultures have been negative. He is feeling better. He is saturating normal on room air. Strep and legionella antigens are negative. Denies any nausea, vomiting. He was changed over to oral antibiotics. This morning, and then was discharged.          09/13/2014 1st [facility] office visit/ Tamana Hatfield   Chief Complaint  Patient presents with  . Advice Only    referred by Dr. Darcus Austin, Pneumonia   onset was abrupt on Super bowl Sunday/ cough/congestion and increase need for albterol/ mucus mostly white, never high fever admitted Last levaquin Feb 29, feeling back to  baseline = doe with more than slow adls/ chronic dry daytime cough, neither  cough or sob better with saba/ still on advair 250 bid. >>d/c advair and ACE , change to symbicort and diovan       10/31/14 Folllow up PNA /COPD  Pt returns for a check up.  Seen last ov for a post hospital follow up .  Admitted for CAP with multilobar PNA.  tx w/ abx and steroids.  He is feeling much better. Gotten back to his exercise.  However started having some increased cough and congestion that is white over last 2-3 days.  Wanted to make sure he is not developing PNA again.  ESR last ov was slightly up at 32.  No chest pain, orthopnea, edema , hemoptysis , fever or discolored mucus.  rec Mucinex DM Twice daily  As needed  Cough and congestion .  Fluids and rest  Zpack to have on hold if symptoms worsen with discolored mucus .     11/28/2014 f/u ov/Aristidis Talerico re: GOLD II copd Dellis Anes 160 2bid  Chief Complaint  Patient presents with  . Follow-up    Pt states breathing is overall doing well. Still has some cough- prod with minimal white sputum.  He has only used albuterol 1-2 x in the past month.   Not limited by breathing from desired activities   Mostly cough in am/ rarely wakes him up  rec Work on inhaler technique:   Add pepcid 20 mg one at bedtime until am cough is gone  GERD  Diet  04/20/2015  f/u ov/Rynlee Lisbon re: recurrent cough x one week on symbicort 160 2 bid though hfa 50%  Chief Complaint  Patient presents with  . Follow-up    COPD. Pt states his breathing is doing well. Pt states that he did use albuterol twice yesterday due to his symptoms. Pt c/o of SOB, productive cough with white mucus, phlegm in his throat. Pt denies any f/n/v/d, CP/tightness.    Onset was acute/ now needing saba with immediate relief, did not restart pepcid as rec at onset of cough   No obvious day to day or daytime variabilty or assoc cp or chest tightness, subjective wheeze overt sinus or hb symptoms. No unusual  exp hx or h/o childhood pna/ asthma or knowledge of premature birth.  Sleeping ok without nocturnal  or early am exacerbation  of respiratory  c/o's or need for noct saba. Also denies any obvious fluctuation of symptoms with weather or environmental changes or other aggravating or alleviating factors except as outlined above   Current Medications, Allergies, Complete Past Medical History, Past Surgical History, Family History, and Social History were reviewed in [employer] record.  ROS  The following are not active complaints unless bolded sore throat, dysphagia, dental problems, itching, sneezing,  nasal congestion or excess/ purulent secretions, ear ache,   fever, chills, sweats, unintended wt loss, pleuritic or exertional cp, hemoptysis,  orthopnea pnd or leg swelling, presyncope, palpitations, heartburn, abdominal pain, anorexia, nausea, vomiting, diarrhea  or change in bowel or urinary habits, change in stools or urine, dysuria,hematuria,  rash, arthralgias, visual complaints, headache, numbness weakness or ataxia or problems with walking or coordination,  change in mood/affect or memory.               Objective:   Physical Exam  amb wm nad   04/20/2015        212  Wt Readings from Last 3 Encounters:  11/28/14 207 lb 6.4 oz (94.076 kg)  10/31/14 202 lb 9.6 oz (91.899 kg)  10/21/14 201 lb 1.9 oz (91.227 kg)    Vital signs reviewed    HEENT: nl dentition, turbinates, and orophanx. Nl external ear canals without cough reflex   NECK :  without JVD/Nodes/TM/ nl carotid upstrokes bilaterally   LUNGS: no acc muscle use, clear to A and P bilaterally without cough on insp or exp maneuvers   CV:  RRR  no s3 or murmur or increase in P2, no edema   ABD:  soft and nontender with nl excursion in the supine position. No bruits or organomegaly, bowel sounds nl  MS:  warm without deformities, calf tenderness, cyanosis or clubbing  SKIN: warm and dry without  lesions    NEURO:  alert, approp, no deficits    CXR PA and Lateral:   11/28/2014 :     I personally reviewed images and agree with radiology impression as follows:    Stable COPD and bibasilar scarring. No active disease.      Assessment:

## 2015-04-20 NOTE — Patient Instructions (Addendum)
zpak > go ahead and take it  Work on inhaler technique:  relax and gently blow all the way out then take a nice smooth deep breath back in, triggering the inhaler at same time you start breathing in.  Hold for up to 5 seconds if you can. Blow out thru nose. Rinse and gargle with water when done    Try prilosec otc 20mg   Take 30-60 min before first meal of the day and Pepcid ac (famotidine) 20 mg one @  bedtime until cough is completely gone for at least a week without the need for cough suppression  GERD (REFLUX)  is an extremely common cause of respiratory symptoms just like yours , many times with no obvious heartburn at all.    It can be treated with medication, but also with lifestyle changes including elevation of the head of your bed (ideally with 6 inch  bed blocks),  Smoking cessation, avoidance of late meals, excessive alcohol, and avoid fatty foods, chocolate, peppermint, colas, red wine, and acidic juices such as orange juice.  NO MINT OR MENTHOL PRODUCTS SO NO COUGH DROPS  USE SUGARLESS CANDY INSTEAD (Jolley ranchers or Stover's or Life Savers) or even ice chips will also do - the key is to swallow to prevent all throat clearing. NO OIL BASED VITAMINS - use powdered substitutes.  For cough take mucinex dm up to 1200 mg every 12 hours as needed    If you are satisfied with your treatment plan,  let your doctor know and he/she can either refill your medications or you can return here when your prescription runs out.     If in any way you are not 100% satisfied,  please tell us.  If 100% better, tell your friends!  Pulmonary follow up is as needed

## 2015-04-20 NOTE — Telephone Encounter (Signed)
Spoke with pt, states he needs a zpak sent to [facility] on [address].  Pt thought he had a pack to spare but he does not have any zpak's at home.  Pt states he discussed this with MW at his last ov.  MW are you ok with sending a zpak in?  Thanks!

## 2015-04-20 NOTE — Telephone Encounter (Signed)
yes

## 2015-04-23 NOTE — Assessment & Plan Note (Addendum)
11/30/13  FEV1  1.76(57%) ratio 58 and no better p saba, dlco 78% 09/13/2014 stop acei    04/20/2015   p extensive coaching HFA effectiveness =    75%    Despite poor HFA he is actually doing well chronically >>  his main problem now is cough possibly related to uri or to reflux> recommended to go ahead and take a Z-Pak and add back the recommended maximum treatment for reflux in the event of any cough for any reason since coughing can cause reflux, esp in setting of obesity  I had an extended discussion with the patient reviewing all relevant studies completed to date and  lasting 15 to 20 minutes of a 25 minute visit    Each maintenance medication was reviewed in detail including most importantly the difference between maintenance and prns and under what circumstances the prns are to be triggered using an action plan format that is not reflected in the computer generated alphabetically organized AVS.    Please see instructions for details which were reviewed in writing and the patient given a copy highlighting the part that I personally wrote and discussed at today's ov.   Pulmonary f/u with GOLD II copd can be prn with refills per primary care

## 2015-04-23 NOTE — Assessment & Plan Note (Signed)
Complicated by diabetes hypertension and hyperlipidemia  Body mass index is 30.42 kg/(m^2).  Lab Results  Component Value Date   TSH 1.303 09/07/2014     Contributing to gerd tendency/ doe/reviewed the need and the process to achieve and maintain neg calorie balance > defer f/u primary care including intermittently monitoring thyroid status

## 2015-04-23 NOTE — Assessment & Plan Note (Signed)
Explained the natural history of uri and why it's necessary in patients at risk to treat GERD aggressively - at least  short term -   to reduce risk of evolving cyclical cough initially  triggered by epithelial injury and a heightened sensitivty to the effects of any upper airway irritants,  most importantly acid - related - then perpetuated by epithelial injury related to the cough itself as the upper airway collapses on itself.  That is, the more sensitive the epithelium becomes once it is damaged by the virus, the more the ensuing irritability> the more the cough, the more the secondary reflux (especially in those prone to reflux) the more the irritation of the sensitive mucosa and so on in a  Classic cyclical pattern.   

## 2015-04-26 ENCOUNTER — Other Ambulatory Visit: Payer: Self-pay | Admitting: Adult Health

## 2015-05-11 ENCOUNTER — Encounter: Payer: Self-pay | Admitting: Podiatry

## 2015-05-11 ENCOUNTER — Ambulatory Visit (INDEPENDENT_AMBULATORY_CARE_PROVIDER_SITE_OTHER): Payer: PPO | Admitting: Podiatry

## 2015-05-11 DIAGNOSIS — B351 Tinea unguium: Secondary | ICD-10-CM | POA: Diagnosis not present

## 2015-05-11 DIAGNOSIS — M79676 Pain in unspecified toe(s): Secondary | ICD-10-CM

## 2015-05-11 NOTE — Progress Notes (Signed)
Subjective:     Patient ID: [name], male   DOB: May 02, 1936, 79 y.o.   MRN: [id number]  HPIThis patient presents saying his nails have grown thick and long since his last visit.  His nails are painful walking and wearing his shoes.  His ingrown toenail surgery healing well.  He is diabetic diagnosed with neuropathy by his doctor.   Review of Systems     Objective:   Physical Exam GENERAL APPEARANCE: Alert, conversant. Appropriately groomed. No acute distress.  VASCULAR: Pedal pulses palpable at  [facility] and PT bilateral.  Capillary refill time is immediate to all digits,  Normal temperature gradient.  Digital hair growth is present bilateral  NEUROLOGIC: sensation is normal to 5.07 monofilament at 5/5 sites bilateral.  Light touch is intact bilateral, Muscle strength normal.  MUSCULOSKELETAL: acceptable muscle strength, tone and stability bilateral.  Intrinsic muscluature intact bilateral.  Rectus appearance of foot and digits noted bilateral.   DERMATOLOGIC: skin color, texture, and turgor are within normal limits.  No preulcerative lesions or ulcers  are seen, no interdigital maceration noted.  No open lesions present.  . No drainage noted. NAILS  Thick disfigured discolored nails both feet.  Healing noted along lateral border left hallux.      Assessment:     Onychomycosis     Plan:     Debridement of nails.

## 2015-06-28 ENCOUNTER — Other Ambulatory Visit: Payer: Self-pay | Admitting: Internal Medicine

## 2015-06-28 ENCOUNTER — Telehealth: Payer: Self-pay | Admitting: Internal Medicine

## 2015-06-28 NOTE — Telephone Encounter (Signed)
Spoke with pt. Reports SOB and coughing. Cough at times produces white mucus. Feels like he was running a fever a few nights ago. His wife had a Zpack at home and he started taking this last night. Pt would like MW's recommendations.  MW - please advise. Thanks.

## 2015-06-28 NOTE — Telephone Encounter (Signed)
zpak is fine 

## 2015-06-28 NOTE — Telephone Encounter (Signed)
Spoke with pt. He is aware of MW's recommendations. Nothing further was needed.

## 2015-06-28 NOTE — Telephone Encounter (Signed)
Attempted to contact pt. No answer. Will try back. 

## 2015-06-30 ENCOUNTER — Telehealth: Payer: Self-pay | Admitting: Internal Medicine

## 2015-06-30 MED ORDER — LEVOFLOXACIN 500 MG PO TABS: 500.0000 mg | ORAL_TABLET | Freq: Every day | ORAL | Status: DC

## 2015-06-30 NOTE — Telephone Encounter (Signed)
Spoke with pt and is aware of recs below. He does not want the prednisone called in, just ABX. He scheduled appt for 12/22 w/ MW. Nothing further needed

## 2015-06-30 NOTE — Telephone Encounter (Signed)
Spoke with pt. He started ZPAK 12/14 and will finish tomorrow. He reports he is feeling very SOB, has very little cough, some wheezing/chest tx, sweats, fever last night 100.1 but none today. He reports he is using albuterol inhaler about 3-4 times. Please advise MW thanks

## 2015-06-30 NOTE — Telephone Encounter (Signed)
Be sure he's taking prilosec Take 30- 60 min before your first and last meals of the day   Prednisone 10 mg take  4 each am x 2 days,   2 each am x 2 days,  1 each am x 2 days and stop   levaquin 500 mg daily x 7 days  F/u 07/06/15 with me

## 2015-07-03 ENCOUNTER — Ambulatory Visit (INDEPENDENT_AMBULATORY_CARE_PROVIDER_SITE_OTHER)
Admission: RE | Admit: 2015-07-03 | Discharge: 2015-07-03 | Disposition: A | Discharge status: Home / Self Care | Payer: PPO | Source: Ambulatory Visit | Attending: Internal Medicine | Admitting: Internal Medicine

## 2015-07-03 ENCOUNTER — Ambulatory Visit (INDEPENDENT_AMBULATORY_CARE_PROVIDER_SITE_OTHER): Payer: PPO | Admitting: Internal Medicine

## 2015-07-03 ENCOUNTER — Encounter: Payer: Self-pay | Admitting: Internal Medicine

## 2015-07-03 DIAGNOSIS — J449 Chronic obstructive pulmonary disease, unspecified: Secondary | ICD-10-CM | POA: Diagnosis not present

## 2015-07-03 NOTE — Patient Instructions (Addendum)
If not improving go ahead and take the prednisone we called in   Work on inhaler technique:  relax and gently blow all the way out then take a nice smooth deep breath back in, triggering the inhaler at same time you start breathing in.  Hold for up to 5 seconds if you can. Blow out thru nose. Rinse and gargle with water when done   Try prilosec otc 20mg   Take 30-60 min before first and last meals of the day  until cough is completely gone for at least a week without the need for cough suppression  GERD (REFLUX)  is an extremely common cause of respiratory symptoms just like yours , many times with no obvious heartburn at all.    It can be treated with medication, but also with lifestyle changes including elevation of the head of your bed (ideally with 6 inch  bed blocks),  Smoking cessation, avoidance of late meals, excessive alcohol, and avoid fatty foods, chocolate, peppermint, colas, red wine, and acidic juices such as orange juice.  NO MINT OR MENTHOL PRODUCTS SO NO COUGH DROPS  USE SUGARLESS CANDY INSTEAD (Jolley ranchers or Stover's or Life Savers) or even ice chips will also do - the key is to swallow to prevent all throat clearing. NO OIL BASED VITAMINS - use powdered substitutes.  For cough take mucinex dm up to 1200 mg every 12 hours as needed    If you are satisfied with your treatment plan,  let your doctor know and he/she can either refill your medications or you can return here when your prescription runs out.     If in any way you are not 100% satisfied,  please tell us.  If 100% better, tell your friends!  Pulmonary follow up is as needed

## 2015-07-03 NOTE — Progress Notes (Signed)
Subjective:     Patient ID: [name], male   DOB: 08/30/1935,   MRN: [id number]    Brief patient profile:  64 yowm quit smoking at dx of pna 03/2014 maintiained on advair since mid 2000s with confirmed GOLD II copd 11/30/13 referred to pulmonary clinic 09/13/2014  by Dr Darcus Austin for new dx of pna with flare of symptoms Aug 21 2014  Admit date: 09/07/2014 Discharge date: 09/09/2014   DISCHARGE DIAGNOSES:  Principal Problem:  CAP (community acquired pneumonia) Active Problems:  Chronic tension headaches  Diabetes mellitus type 2 with complications  Hyponatremia  Hyperlipidemia  COPD (chronic obstructive pulmonary disease)  Leukocytosis  Anemia      Filed Weights   09/07/14 1307  Weight: 88.905 kg (196 lb)    INITIAL HISTORY: 79 year old male of [place] descent with a past medical history of COPD, asthma and diabetes who presented with a 3 week history of worsening shortness of breath and cough. He was initially treated as an outpatient with steroid for bronchitis. With no improvement, he decided to come into the hospital. He was detected to have multilobar pneumonia in the ED. He was subsequently admitted to the hospital.    HOSPITAL COURSE:   Multilobar community-acquired pneumonia Patient was placed on intravenous antibiotics. He improved rather quickly. Cultures have been negative. He is feeling better. He is saturating normal on room air. Strep and legionella antigens are negative. Denies any nausea, vomiting. He was changed over to oral antibiotics. This morning, and then was discharged.          09/13/2014 1st [facility] office visit/ Rubin Dais   Chief Complaint  Patient presents with  . Advice Only    referred by Dr. Darcus Austin, Pneumonia   onset was abrupt on Super bowl Sunday/ cough/congestion and increase need for albterol/ mucus mostly white, never high fever admitted Last levaquin Feb 29, feeling back to baseline = doe with more than slow  adls/ chronic dry daytime cough, neither  cough or sob better with saba/ still on advair 250 bid. >>d/c advair and ACE , change to symbicort and diovan       10/31/14 NP Fol[place]ow up PNA /COPD  Pt returns for a check up.  Seen last ov for a post hospital follow up .  Admitted for CAP with multilobar PNA.  tx w/ abx and steroids.  He is feeling much better. Gotten back to his exercise.  However started having some increased cough and congestion that is white over last 2-3 days.  Wanted to make sure he is not developing PNA again.  ESR last ov was slightly up at 32.  No chest pain, orthopnea, edema , hemoptysis , fever or discolored mucus.  rec Mucinex DM Twice daily  As needed  Cough and congestion .  Fluids and rest  Zpack to have on hold if symptoms worsen with discolored mucus .     11/28/2014 f/u ov/Emylia Latella re: GOLD II copd Dellis Anes 160 2bid  Chief Complaint  Patient presents with  . Follow-up    Pt states breathing is overall doing well. Still has some cough- prod with minimal white sputum.  He has only used albuterol 1-2 x in the past month.   Not limited by breathing from desired activities   Mostly cough in am/ rarely wakes him up  rec Work on inhaler technique:   Add pepcid 20 mg one at bedtime until am cough is gone  GERD  Diet    04/20/2015  f/u ov/Wert re: recurrent cough x one week on symbicort 160 2 bid though hfa 50%  Chief Complaint  Patient presents with  . Follow-up    COPD. Pt states his breathing is doing well. Pt states that he did use albuterol twice yesterday due to his symptoms. Pt c/o of SOB, productive cough with white mucus, phlegm in his throat. Pt denies any f/n/v/d, CP/tightness.    Onset was acute/ now needing saba with immediate relief, did not restart pepcid as rec at onset of cough  rec zpak > go ahead and take it Work on inhaler technique:  Try prilosec otc 20mg  Take 30-60 min before first meal of the day and Pepcid ac (famotidine) 20 mg one @   bedtime until cough is completely gone for at least a week without the need for cough suppression GERD diet    06/30/15 cough/ low grade fever no better on zpak >  Be sure he's taking prilosec Take 30- 60 min before your first and last meals of the day > was not  Prednisone 10 mg take  4 each am x 2 days,   2 each am x 2 days,  1 each am x 2 days and stop  Levaquin 500 mg daily x 7 days   07/03/2015 acute extended ov/Wert re: f/u ? pna  Chief Complaint  Patient presents with  . Acute Visit    Pt states developed fever and chills 6 days ago. He feels very fatigued and achy. He has had some cough- non very prod. He has had some bloody nasal d/c.    had been doing a lot better until 6 days prior to OV  And starting to feel better in last 24 h on levaquin    No obvious day to day or daytime variabilty or assoc excess/ purulent sputum or mucus plugs    cp or chest tightness, subjective wheeze overt sinus or hb symptoms. No unusual exp hx or h/o childhood pna/ asthma or knowledge of premature birth.  Sleeping ok without nocturnal  or early am exacerbation  of respiratory  c/o's or need for noct saba. Also denies any obvious fluctuation of symptoms with weather or environmental changes or other aggravating or alleviating factors except as outlined above   Current Medications, Allergies, Complete Past Medical History, Past Surgical History, Family History, and Social History were reviewed in [facility] electronic medical record.  ROS  The following are not active complaints unless bolded sore throat, dysphagia, dental problems, itching, sneezing,  nasal congestion or excess/ purulent secretions, ear ache,   fever, chills, sweats, unintended wt loss, pleuritic or exertional cp, hemoptysis,  orthopnea pnd or leg swelling, presyncope, palpitations, heartburn, abdominal pain, anorexia, nausea, vomiting, diarrhea  or change in bowel or urinary habits, change in stools or urine, dysuria,hematuria,   rash, arthralgias, visual complaints, headache, numbness weakness or ataxia or problems with walking or coordination,  change in mood/affect or memory.               Objective:   Physical Exam  amb wm nad   04/20/2015        212  >  07/03/2015    209     11/28/14 207 lb 6.4 oz (94.076 kg)  10/31/14 202 lb 9.6 oz (91.899 kg)  10/21/14 201 lb 1.9 oz (91.227 kg)    Vital signs reviewed    HEENT: nl dentition, turbinates, and orophanx. Nl external ear canals without cough reflex   NECK :    without JVD/Nodes/TM/ nl carotid upstrokes bilaterally   LUNGS: no acc muscle use,  insp and exp rhonchi minimal bilaterally    CV:  RRR  no s3 or murmur or increase in P2, no edema   ABD:  soft and nontender with nl excursion in the supine position. No bruits or organomegaly, bowel sounds nl  MS:  warm without deformities, calf tenderness, cyanosis or clubbing  SKIN: warm and dry without lesions    NEURO:  alert, approp, no deficits    CXR PA and Lateral:   07/03/2015 :    I personally reviewed images and agree with radiology impression as follows:   Patchy interstitial pneumonia bilaterally. There is underlying COPD-reactive airway disease My impression = no evidence pna.       Assessment:             

## 2015-07-05 NOTE — Assessment & Plan Note (Signed)
11/30/13  FEV1  1.76(57%) ratio 58 and no better p saba, dlco 78% 09/13/2014 stop acei    - 07/03/2015  extensive coaching HFA effectiveness =    75%   Mild flare with ? Viral uri vs CAP > very hard to be sure but it doesn't matter anyways he is already covered with Levaquin adequately. The only issue is whether this enough of an inflammatory component to warrant 6 days of prednisone. I left up to him whether he actually takes the 6 days are not depending on how he responds to the [place].  Overall I think he is doing very well on his present regimen which consists of Symbicort 162 puffs every 12 hours and as needed albuterol  Which he rarely finds now that he needs.  I had an extended summary discussion with the patient reviewing all relevant studies completed to date and  lasting 15 to 20 minutes of a 25 minute visit    Each maintenance medication was reviewed in detail including most importantly the difference between maintenance and prns and under what circumstances the prns are to be triggered using an action plan format that is not reflected in the computer generated alphabetically organized AVS.    Please see instructions for details which were reviewed in writing and the patient given a copy highlighting the part that I personally wrote and discussed at today's ov.

## 2015-07-06 ENCOUNTER — Ambulatory Visit: Payer: PPO | Admitting: Internal Medicine

## 2015-07-14 ENCOUNTER — Telehealth: Payer: Self-pay | Admitting: Internal Medicine

## 2015-07-14 NOTE — Telephone Encounter (Signed)
Per 07/03/15 OV:  Patient Instructions       If not improving go ahead and take the prednisone we called in  Work on inhaler technique:  relax and gently blow all the way out then take a nice smooth deep breath back in, triggering the inhaler at same time you start breathing in.  Hold for up to 5 seconds if you can. Blow out thru nose. Rinse and gargle with water when done  try prilosec otc 20mg   Take 30-60 min before first and last meals of the day  until cough is completely gone for at least a week without the need for cough suppression GERD (REFLUX)  is an extremely common cause of respiratory symptoms just like yours , many times with no obvious heartburn at all.   It can be treated with medication, but also with lifestyle changes including elevation of the head of your bed (ideally with 6 inch  bed blocks),  Smoking cessation, avoidance of late meals, excessive alcohol, and avoid fatty foods, chocolate, peppermint, colas, red wine, and acidic juices such as orange juice.   NO MINT OR MENTHOL PRODUCTS SO NO COUGH DROPS  USE SUGARLESS CANDY INSTEAD (Jolley ranchers or Stover's or Life Savers) or even ice chips will also do - the key is to swallow to prevent all throat clearing. NO OIL BASED VITAMINS - use powdered substitutes. For cough take mucinex dm up to 1200 mg every 12 hours as needed   If you are satisfied with your treatment plan,  let your doctor know and he/she can either refill your medications or you can return here when your prescription runs out.    If in any way you are not 100% satisfied,  please tell us.  If 100% better, tell your friends! Pulmonary follow up is as needed     --  Called spoke with pt. He was needing the names of the 2 OTC medications MW recommended. I advised him mucinex DM and prilosec.  He verbalized understanding and needed nothing further

## 2015-07-20 ENCOUNTER — Ambulatory Visit (INDEPENDENT_AMBULATORY_CARE_PROVIDER_SITE_OTHER): Payer: PPO | Admitting: Podiatry

## 2015-07-20 ENCOUNTER — Encounter: Payer: Self-pay | Admitting: Podiatry

## 2015-07-20 DIAGNOSIS — B351 Tinea unguium: Secondary | ICD-10-CM | POA: Diagnosis not present

## 2015-07-20 DIAGNOSIS — E1140 Type 2 diabetes mellitus with diabetic neuropathy, unspecified: Secondary | ICD-10-CM

## 2015-07-20 DIAGNOSIS — M79676 Pain in unspecified toe(s): Secondary | ICD-10-CM

## 2015-07-20 NOTE — Progress Notes (Signed)
Patient ID: [name], male   DOB: 09/13/1935, 80 y.o.   MRN: [id number] Complaint:  Visit Type: Patient returns to my office for continued preventative foot care services. Complaint: Patient states" my nails have grown long and thick and become painful to walk and wear shoes" Patient has been diagnosed with DM with neuropathy. He presents for preventative foot care services. No changes to ROS  Podiatric Exam: Vascular: dorsalis pedis and posterior tibial pulses are palpable bilateral. Capillary return is immediate. Temperature gradient is WNL. Skin turgor WNL  Sensorium: Diminishedl Semmes Weinstein monofilament test. Normal tactile sensation bilaterally. Nail Exam: Pt has thick disfigured discolored nails with subungual debris noted bilateral entire nail hallux through fifth toenails Ulcer Exam: There is no evidence of ulcer or pre-ulcerative changes or infection. Orthopedic Exam: Muscle tone and strength are WNL. No limitations in general ROM. No crepitus or effusions noted. Foot type and digits show no abnormalities. Bony prominences are unremarkable.Hammer toes 2-5 B/L Skin: No Porokeratosis. No infection or ulcers  Diagnosis:  Tinea unguium, Pain in right toe, pain in left toes  Treatment & Plan Procedures and Treatment: Consent by patient was obtained for treatment procedures. The patient understood the discussion of treatment and procedures well. All questions were answered thoroughly reviewed. Debridement of mycotic and hypertrophic toenails, 1 through 5 bilateral and clearing of subungual debris. No ulceration, no infection noted.  Return Visit-Office Procedure: Patient instructed to return to the office for a follow up visit 3 months for continued evaluation and treatment.  Gardiner Barefoot DPM

## 2015-07-27 DIAGNOSIS — J3489 Other specified disorders of nose and nasal sinuses: Secondary | ICD-10-CM | POA: Diagnosis not present

## 2015-07-27 DIAGNOSIS — D649 Anemia, unspecified: Secondary | ICD-10-CM | POA: Diagnosis not present

## 2015-07-27 DIAGNOSIS — R5383 Other fatigue: Secondary | ICD-10-CM | POA: Diagnosis not present

## 2015-07-28 DIAGNOSIS — H2513 Age-related nuclear cataract, bilateral: Secondary | ICD-10-CM | POA: Diagnosis not present

## 2015-07-28 DIAGNOSIS — H5203 Hypermetropia, bilateral: Secondary | ICD-10-CM | POA: Diagnosis not present

## 2015-07-28 DIAGNOSIS — H353131 Nonexudative age-related macular degeneration, bilateral, early dry stage: Secondary | ICD-10-CM | POA: Diagnosis not present

## 2015-07-28 DIAGNOSIS — E119 Type 2 diabetes mellitus without complications: Secondary | ICD-10-CM | POA: Diagnosis not present

## 2015-08-02 ENCOUNTER — Ambulatory Visit: Payer: PPO | Admitting: Internal Medicine

## 2015-08-11 ENCOUNTER — Encounter: Payer: Self-pay | Admitting: Internal Medicine

## 2015-08-11 ENCOUNTER — Ambulatory Visit (INDEPENDENT_AMBULATORY_CARE_PROVIDER_SITE_OTHER)
Admission: RE | Admit: 2015-08-11 | Discharge: 2015-08-11 | Disposition: A | Discharge status: Home / Self Care | Payer: PPO | Source: Ambulatory Visit | Attending: Internal Medicine | Admitting: Internal Medicine

## 2015-08-11 ENCOUNTER — Ambulatory Visit (INDEPENDENT_AMBULATORY_CARE_PROVIDER_SITE_OTHER): Payer: PPO | Admitting: Internal Medicine

## 2015-08-11 VITALS — BP 128/78 | HR 93 | Ht 70.0 in | Wt 210.4 lb

## 2015-08-11 DIAGNOSIS — J189 Pneumonia, unspecified organism: Secondary | ICD-10-CM | POA: Diagnosis not present

## 2015-08-11 DIAGNOSIS — R05 Cough: Secondary | ICD-10-CM

## 2015-08-11 DIAGNOSIS — R058 Other specified cough: Secondary | ICD-10-CM

## 2015-08-11 DIAGNOSIS — J449 Chronic obstructive pulmonary disease, unspecified: Secondary | ICD-10-CM | POA: Diagnosis not present

## 2015-08-11 NOTE — Progress Notes (Signed)
Subjective:     Patient ID: [name], male   DOB: 11/19/1935,   MRN: [id number]    Brief patient profile:  79 yowm quit smoking at dx of pna 03/2014 maintiained on advair since mid 2000s with confirmed GOLD II copd 11/30/13 referred to pulmonary clinic 09/13/2014  by Dr Donna Gates for new dx of pna with flare of symptoms Aug 21 2014  Admit date: 09/07/2014 Discharge date: 09/09/2014   DISCHARGE DIAGNOSES:  Principal Problem:  CAP (community acquired pneumonia) Active Problems:  Chronic tension headaches  Diabetes mellitus type 2 with complications  Hyponatremia  Hyperlipidemia  COPD (chronic obstructive pulmonary disease)  Leukocytosis  Anemia      Filed Weights   09/07/14 1307  Weight: 88.905 kg (196 lb)    INITIAL HISTORY: 80-year-old male of [place] descent with a past medical history of COPD, asthma and diabetes who presented with a 3 week history of worsening shortness of breath and cough. He was initially treated as an outpatient with steroid for bronchitis. With no improvement, he decided to come into the hospital. He was detected to have multilobar pneumonia in the ED. He was subsequently admitted to the hospital.    HOSPITAL COURSE:   Multilobar community-acquired pneumonia Patient was placed on intravenous antibiotics. He improved rather quickly. Cultures have been negative. He is feeling better. He is saturating normal on room air. Strep and legionella antigens are negative. Denies any nausea, vomiting. He was changed over to oral antibiotics. This morning, and then was discharged.          09/13/2014 1st [facility] office visit/ Requan Hardge   Chief Complaint  Patient presents with  . Advice Only    referred by Dr. Donna Gates, Pneumonia   onset was abrupt on Super bowl Sunday/ cough/congestion and increase need for albterol/ mucus mostly white, never high fever admitted Last levaquin Feb 29, feeling back to baseline = doe with more than slow  adls/ chronic dry daytime cough, neither  cough or sob better with saba/ still on advair 250 bid. >>d/c advair and ACE , change to symbicort and diovan       10/31/14 NP Folllow up PNA /COPD  Pt returns for a check up.  Seen last ov for a post hospital follow up .  Admitted for CAP with multilobar PNA.  tx w/ abx and steroids.  He is feeling much better. Gotten back to his exercise.  However started having some increased cough and congestion that is white over last 2-3 days.  Wanted to make sure he is not developing PNA again.  ESR last ov was slightly up at 32.  No chest pain, orthopnea, edema , hemoptysis , fever or discolored mucus.  rec Mucinex DM Twice daily  As needed  Cough and congestion .  Fluids and rest  Zpack to have on hold if symptoms worsen with discolored mucus .     11/28/2014 f/u ov/Nikitha Mode re: GOLD II copd /symbicort 160 2bid  Chief Complaint  Patient presents with  . Follow-up    Pt states breathing is overall doing well. Still has some cough- prod with minimal white sputum.  He has only used albuterol 1-2 x in the past month.   Not limited by breathing from desired activities   Mostly cough in am/ rarely wakes him up  rec Work on inhaler technique:   Add pepcid 20 mg one at bedtime until am cough is gone  GERD  Diet    04/20/2015    f/u ov/Aarthi Uyeno re: recurrent cough x one week on symbicort 160 2 bid though hfa 50%  Chief Complaint  Patient presents with  . Follow-up    COPD. Pt states his breathing is doing well. Pt states that he did use albuterol twice yesterday due to his symptoms. Pt c/o of SOB, productive cough with white mucus, phlegm in his throat. Pt denies any f/n/v/d, CP/tightness.    Onset was acute/ now needing saba with immediate relief, did not restart pepcid as rec at onset of cough  rec zpak > go ahead and take it Work on inhaler technique:  Try prilosec otc 67m  Take 30-60 min before first meal of the day and Pepcid ac (famotidine) 20 mg one @   bedtime until cough is completely gone for at least a week without the need for cough suppression GERD diet    06/30/15 cough/ low grade fever no better on zpak >  Be sure he's taking prilosec Take 30- 60 min before your first and last meals of the day > was not  Prednisone 10 mg take  4 each am x 2 days,   2 each am x 2 days,  1 each am x 2 days and stop  Levaquin 500 mg daily x 7 days   07/03/2015 acute extended ov/Haillee Johann re: f/u ? pna  Chief Complaint  Patient presents with  . Acute Visit    Pt states developed fever and chills 6 days ago. He feels very fatigued and achy. He has had some cough- non very prod. He has had some bloody nasal d/c.    had been doing a lot better until 6 days prior to OV  And starting to feel better in last 24 h on levaquin  rec If not improving go ahead and take the prednisone we called in  Work on inhaler technique:  relax and gently blow all the way out then take a nice smooth deep breath back in, triggering the inhaler at same time you start breathing in.  Hold for up to 5 seconds if you can. Blow out thru nose. Rinse and gargle with water when done Try prilosec otc 212m Take 30-60 min before first and last meals of the day  until cough is completely gone for at least a week without the need for cough suppression GERD diet   For cough take mucinex dm up to 1200 mg every 12 hours as needed    08/11/2015  f/u ov/Khayman Kirsch re: GOLD II / f/u pneumonia   Chief Complaint  Patient presents with  . Follow-up    Pt c/o cough at nighttime and some SOB with exertion. Pt is using a stationary bike half an hour per day with no SOB.     Overall quite a bit better and not really limited by breathing from daily activities on symb 160 2bid / rare need for saba     No obvious day to day or daytime variabilty or assoc excess/ purulent sputum or mucus plugs    cp or chest tightness, subjective wheeze overt sinus or hb symptoms. No unusual exp hx or h/o childhood pna/ asthma  or knowledge of premature birth.  Sleeping ok without nocturnal  or early am exacerbation  of respiratory  c/o's or need for noct saba. Also denies any obvious fluctuation of symptoms with weather or environmental changes or other aggravating or alleviating factors except as outlined above   Current Medications, Allergies, Complete Past Medical History, Past Surgical History,  Family History, and Social History were reviewed in [employer] record.  ROS  The following are not active complaints unless bolded sore throat, dysphagia, dental problems, itching, sneezing,  nasal congestion or excess/ purulent secretions, ear ache,   fever, chills, sweats, unintended wt loss, pleuritic or exertional cp, hemoptysis,  orthopnea pnd or leg swelling, presyncope, palpitations, heartburn, abdominal pain, anorexia, nausea, vomiting, diarrhea  or change in bowel or urinary habits, change in stools or urine, dysuria,hematuria,  rash, arthralgias, visual complaints, headache, numbness weakness or ataxia or problems with walking or coordination,  change in mood/affect or memory.               Objective:   Physical Exam  amb wm nad   04/20/2015        212  >  07/03/2015    209 > 08/11/2015  210     11/28/14 207 lb 6.4 oz (94.076 kg)  10/31/14 202 lb 9.6 oz (91.899 kg)  10/21/14 201 lb 1.9 oz (91.227 kg)    Vital signs reviewed    HEENT: nl dentition, turbinates, and orophanx. Nl external ear canals without cough reflex   NECK :  without JVD/Nodes/TM/ nl carotid upstrokes bilaterally   LUNGS: no acc muscle use,  Very minimal insp and exp rhonchi   bilaterally    CV:  RRR  no s3 or murmur or increase in P2, no edema   ABD:  soft and nontender with nl excursion in the supine position. No bruits or organomegaly, bowel sounds nl  MS:  warm without deformities, calf tenderness, cyanosis or clubbing  SKIN: warm and dry without lesions    NEURO:  alert, approp, no deficits       CXR PA and Lateral:   08/11/2015 :    I personally reviewed images and agree with radiology impression as follows:    Decreased patchy and interstitial opacities within the mid lower lungs compatible with improving pneumonia.  COPD/emphysema and cardiomegaly.      Assessment:

## 2015-08-11 NOTE — Patient Instructions (Signed)
Add pepcid ac 20 mg at bedtime and try off prilosec  Please schedule a follow up visit in 3 months but call sooner if needed with pfts

## 2015-08-13 NOTE — Assessment & Plan Note (Signed)
Resolved, ok to try on just h2's now > See instructions for specific recommendations which were reviewed directly with the patient who was given a copy with highlighter outlining the key components.

## 2015-08-13 NOTE — Assessment & Plan Note (Addendum)
cxr cleared as of 5/161/6 > no further f/u planned  - Recurrent/ patchy distribution 07/03/15 rx with zpack then levaquin > much better 08/11/2015   Final f/u cxr needed when returns for full pfts / no further abx

## 2015-08-13 NOTE — Assessment & Plan Note (Signed)
11/30/13  FEV1  1.76(57%) ratio 58 and no better p saba, dlco 78% - quit smoking sept 2015  - 09/13/2014 d/c'd  acei    - 08/11/2015  extensive coaching HFA effectiveness =    90% from baseline 75%  No change in   rx needed  Each maintenance medication was reviewed in detail including most importantly the difference between maintenance and as needed and under what circumstances the prns are to be used.  Please see instructions for details which were reviewed in writing and the patient given a copy.

## 2015-08-28 DIAGNOSIS — M25532 Pain in left wrist: Secondary | ICD-10-CM | POA: Diagnosis not present

## 2015-08-28 DIAGNOSIS — M7542 Impingement syndrome of left shoulder: Secondary | ICD-10-CM | POA: Diagnosis not present

## 2015-09-01 DIAGNOSIS — D649 Anemia, unspecified: Secondary | ICD-10-CM | POA: Diagnosis not present

## 2015-09-04 ENCOUNTER — Other Ambulatory Visit: Payer: Self-pay | Admitting: Internal Medicine

## 2015-10-05 ENCOUNTER — Encounter: Payer: Self-pay | Admitting: Podiatry

## 2015-10-05 ENCOUNTER — Ambulatory Visit (INDEPENDENT_AMBULATORY_CARE_PROVIDER_SITE_OTHER): Payer: PPO | Admitting: Podiatry

## 2015-10-05 DIAGNOSIS — E1140 Type 2 diabetes mellitus with diabetic neuropathy, unspecified: Secondary | ICD-10-CM | POA: Diagnosis not present

## 2015-10-05 DIAGNOSIS — B351 Tinea unguium: Secondary | ICD-10-CM

## 2015-10-05 DIAGNOSIS — M79676 Pain in unspecified toe(s): Secondary | ICD-10-CM

## 2015-10-05 NOTE — Progress Notes (Signed)
Patient ID: [name], male   DOB: 09/13/1935, 80 y.o.   MRN: [id number] Complaint:  Visit Type: Patient returns to my office for continued preventative foot care services. Complaint: Patient states" my nails have grown long and thick and become painful to walk and wear shoes" Patient has been diagnosed with DM with neuropathy. He presents for preventative foot care services. No changes to ROS  Podiatric Exam: Vascular: dorsalis pedis and posterior tibial pulses are palpable bilateral. Capillary return is immediate. Temperature gradient is WNL. Skin turgor WNL  Sensorium: Diminishedl Semmes Weinstein monofilament test. Normal tactile sensation bilaterally. Nail Exam: Pt has thick disfigured discolored nails with subungual debris noted bilateral entire nail hallux through fifth toenails Ulcer Exam: There is no evidence of ulcer or pre-ulcerative changes or infection. Orthopedic Exam: Muscle tone and strength are WNL. No limitations in general ROM. No crepitus or effusions noted. Foot type and digits show no abnormalities. Bony prominences are unremarkable.Hammer toes 2-5 B/L Skin: No Porokeratosis. No infection or ulcers  Diagnosis:  Tinea unguium, Pain in right toe, pain in left toes  Treatment & Plan Procedures and Treatment: Consent by patient was obtained for treatment procedures. The patient understood the discussion of treatment and procedures well. All questions were answered thoroughly reviewed. Debridement of mycotic and hypertrophic toenails, 1 through 5 bilateral and clearing of subungual debris. No ulceration, no infection noted.  Return Visit-Office Procedure: Patient instructed to return to the office for a follow up visit 3 months for continued evaluation and treatment.  Gardiner Barefoot DPM

## 2015-10-25 ENCOUNTER — Other Ambulatory Visit: Payer: Self-pay | Admitting: Internal Medicine

## 2015-11-08 ENCOUNTER — Other Ambulatory Visit: Payer: Self-pay | Admitting: Internal Medicine

## 2015-11-08 DIAGNOSIS — R0600 Dyspnea, unspecified: Secondary | ICD-10-CM

## 2015-11-09 ENCOUNTER — Ambulatory Visit (INDEPENDENT_AMBULATORY_CARE_PROVIDER_SITE_OTHER): Payer: PPO | Admitting: Internal Medicine

## 2015-11-09 ENCOUNTER — Encounter: Payer: Self-pay | Admitting: Internal Medicine

## 2015-11-09 VITALS — BP 128/76 | HR 90 | Ht 70.0 in | Wt 214.0 lb

## 2015-11-09 DIAGNOSIS — J449 Chronic obstructive pulmonary disease, unspecified: Secondary | ICD-10-CM | POA: Diagnosis not present

## 2015-11-09 DIAGNOSIS — R0600 Dyspnea, unspecified: Secondary | ICD-10-CM | POA: Diagnosis not present

## 2015-11-09 LAB — PULMONARY FUNCTION TEST
DL/VA % pred: 71 %
DL/VA: 3.29 ml/min/mmHg/L
DLCO COR: 21.51 ml/min/mmHg
DLCO cor % pred: 66 %
DLCO unc % pred: 66 %
DLCO unc: 21.39 ml/min/mmHg
FEF 25-75 Post: 0.92 L/sec
FEF 25-75 Pre: 0.74 L/sec
FEF2575-%CHANGE-POST: 24 %
FEF2575-%Pred-Post: 43 %
FEF2575-%Pred-Pre: 35 %
FEV1-%Change-Post: 7 %
FEV1-%Pred-Post: 62 %
FEV1-%Pred-Pre: 58 %
FEV1-POST: 1.86 L
FEV1-Pre: 1.73 L
FEV1FVC-%CHANGE-POST: 4 %
FEV1FVC-%Pred-Pre: 81 %
FEV6-%Change-Post: 5 %
FEV6-%Pred-Post: 76 %
FEV6-%Pred-Pre: 72 %
FEV6-POST: 2.96 L
FEV6-PRE: 2.8 L
FEV6FVC-%Change-Post: 2 %
FEV6FVC-%PRED-POST: 104 %
FEV6FVC-%PRED-PRE: 101 %
FVC-%CHANGE-POST: 3 %
FVC-%PRED-PRE: 71 %
FVC-%Pred-Post: 73 %
FVC-POST: 3.03 L
FVC-PRE: 2.94 L
POST FEV6/FVC RATIO: 98 %
PRE FEV6/FVC RATIO: 95 %
Post FEV1/FVC ratio: 61 %
Pre FEV1/FVC ratio: 59 %
RV % pred: 207 %
RV: 5.41 L
TLC % pred: 123 %
TLC: 8.72 L

## 2015-11-09 NOTE — Progress Notes (Signed)
PFT done today. 

## 2015-11-09 NOTE — Patient Instructions (Signed)
Try prilosec otc 20mg   Take 30-60 min before first meal of the day and Pepcid ac (famotidine) 20 mg one @  bedtime until cough is completely gone for at least a week without the need for cough suppression     Plan A = Automatic =  symbicort 160 Take 2 puffs first thing in am and then another 2 puffs about 12 hours later.      Plan B = Backup Only use your albuterol as a rescue medication to be used if you can't catch your breath by resting or doing a relaxed purse lip breathing pattern.  - The less you use it, the better it will work when you need it. - Ok to use the inhaler up to 2 puffs  every 4 hours if you must but call for appointment if use goes up over your usual need - Don't leave home without it !!  (think of it like the spare tire for your car)    Please schedule a follow up visit in 6  months but call sooner if needed

## 2015-11-09 NOTE — Progress Notes (Signed)
Subjective:     Patient ID: [name], male   DOB: 15-Mar-1936    MRN: [id number]    Brief patient profile:  80 yowm quit smoking at dx of pna 03/2014 maintiained on advair since mid 2000s with confirmed GOLD II copd 11/30/13 referred to pulmonary clinic 09/13/2014  by Dr Darcus Austin for new dx of pna with flare of symptoms Aug 21 2014  Admit date: 09/07/2014 Discharge date: 09/09/2014   DISCHARGE DIAGNOSES:  Principal Problem:  CAP (community acquired pneumonia) Active Problems:  Chronic tension headaches  Diabetes mellitus type 2 with complications  Hyponatremia  Hyperlipidemia  COPD (chronic obstructive pulmonary disease)  Leukocytosis  Anemia      Filed Weights   09/07/14 1307  Weight: 88.905 kg (196 lb)    INITIAL HISTORY: 80 year old male of [place] descent with a past medical history of COPD, asthma and diabetes who presented with a 3 week history of worsening shortness of breath and cough. He was initially treated as an outpatient with steroid for bronchitis. With no improvement, he decided to come into the hospital. He was detected to have multilobar pneumonia in the ED. He was subsequently admitted to the hospital.    HOSPITAL COURSE:   Multilobar community-acquired pneumonia Patient was placed on intravenous antibiotics. He improved rather quickly. Cultures have been negative. He is feeling better. He is saturating normal on room air. Strep and legionella antigens are negative. Denies any nausea, vomiting. He was changed over to oral antibiotics. This morning, and then was discharged.          09/13/2014 1st [facility] office visit/ Wert   Chief Complaint  Patient presents with  . Advice Only    referred by Dr. Darcus Austin, Pneumonia   onset was abrupt on Super bowl Sunday/ cough/congestion and increase need for albterol/ mucus mostly white, never high fever admitted Last levaquin Feb 29, feeling back to baseline = doe with more than slow  adls/ chronic dry daytime cough, neither  cough or sob better with saba/ still on advair 250 bid. >>d/c advair and ACE , change to symbicort and diovan       10/31/14 NP Folllow up PNA /COPD  Pt returns for a check up.  Seen last ov for a post hospital follow up .  Admitted for CAP with multilobar PNA.  tx w/ abx and steroids.  He is feeling much better. Gotten back to his exercise.  However started having some increased cough and congestion that is white over last 2-3 days.  Wanted to make sure he is not developing PNA again.  ESR last ov was slightly up at 32.  No chest pain, orthopnea, edema , hemoptysis , fever or discolored mucus.  rec Mucinex DM Twice daily  As needed  Cough and congestion .  Fluids and rest  Zpack to have on hold if symptoms worsen with discolored mucus .     11/28/2014 f/u ov/Wert re: GOLD II copd Dellis Anes 160 2bid  Chief Complaint  Patient presents with  . Follow-up    Pt states breathing is overall doing well. Still has some cough- prod with minimal white sputum.  He has only used albuterol 1-2 x in the past month.   Not limited by breathing from desired activities   Mostly cough in am/ rarely wakes him up  rec Work on inhaler technique:   Add pepcid 20 mg one at bedtime until am cough is gone  GERD  Diet    04/20/2015  f/u ov/Wert re: recurrent cough x one week on symbicort 160 2 bid though hfa 50%  Chief Complaint  Patient presents with  . Follow-up    COPD. Pt states his breathing is doing well. Pt states that he did use albuterol twice yesterday due to his symptoms. Pt c/o of SOB, productive cough with white mucus, phlegm in his throat. Pt denies any f/n/v/d, CP/tightness.    Onset was acute/ now needing saba with immediate relief, did not restart pepcid as rec at onset of cough  rec zpak > go ahead and take it Work on inhaler technique:  Try prilosec otc 74m  Take 30-60 min before first meal of the day and Pepcid ac (famotidine) 20 mg one @   bedtime until cough is completely gone for at least a week without the need for cough suppression GERD diet    06/30/15 cough/ low grade fever no better on zpak >  Be sure he's taking prilosec Take 30- 60 min before your first and last meals of the day > was not  Prednisone 10 mg take  4 each am x 2 days,   2 each am x 2 days,  1 each am x 2 days and stop  Levaquin 500 mg daily x 7 days   07/03/2015 acute extended ov/Wert re: f/u ? pna  Chief Complaint  Patient presents with  . Acute Visit    Pt states developed fever and chills 6 days ago. He feels very fatigued and achy. He has had some cough- non very prod. He has had some bloody nasal d/c.    had been doing a lot better until 6 days prior to OV  And starting to feel better in last 24 h on levaquin  rec If not improving go ahead and take the prednisone we called in  Work on inhaler technique:  relax and gently blow all the way out then take a nice smooth deep breath back in, triggering the inhaler at same time you start breathing in.  Hold for up to 5 seconds if you can. Blow out thru nose. Rinse and gargle with water when done Try prilosec otc 269m Take 30-60 min before first and last meals of the day  until cough is completely gone for at least a week without the need for cough suppression GERD diet   For cough take mucinex dm up to 1200 mg every 12 hours as needed    08/11/2015  f/u ov/Wert re: GOLD II / f/u pneumonia   Chief Complaint  Patient presents with  . Follow-up    Pt c/o cough at nighttime and some SOB with exertion. Pt is using a stationary bike half an hour per day with no SOB.    Overall quite a bit better and not really limited by breathing from daily activities on symb 160 2bid / rare need for saba  rec Add pepcid ac 20 mg at bedtime and try off prilosec   11/09/2015  f/u ov/Wert re: GOLD II maint rx symb 160 2bid / prn saba / no longer on any gerd rx  Chief Complaint  Patient presents with  . Follow-up     Had PFT today-had a cough last 3-4 days white sm. amt.,Using exercise bicycle.Sob same.Used Albuterol one time this wk.  doe = MMRC1 = can walk nl pace, flat grade, can't hurry or go uphills or steps s sob    No obvious day to day or daytime variabilty or assoc excess/  purulent sputum or mucus plugs    cp or chest tightness, subjective wheeze overt sinus or hb symptoms. No unusual exp hx or h/o childhood pna/ asthma or knowledge of premature birth.  Sleeping ok without nocturnal  or early am exacerbation  of respiratory  c/o's or need for noct saba. Also denies any obvious fluctuation of symptoms with weather or environmental changes or other aggravating or alleviating factors except as outlined above   Current Medications, Allergies, Complete Past Medical History, Past Surgical History, Family History, and Social History were reviewed in [employer] record.  ROS  The following are not active complaints unless bolded sore throat, dysphagia, dental problems, itching, sneezing,  nasal congestion or excess/ purulent secretions, ear ache,   fever, chills, sweats, unintended wt loss, pleuritic or exertional cp, hemoptysis,  orthopnea pnd or leg swelling, presyncope, palpitations, heartburn, abdominal pain, anorexia, nausea, vomiting, diarrhea  or change in bowel or urinary habits, change in stools or urine, dysuria,hematuria,  rash, arthralgias, visual complaints, headache, numbness weakness or ataxia or problems with walking or coordination,  change in mood/affect or memory.               Objective:   Physical Exam  amb wm nad   04/20/2015        212  >  07/03/2015    209 > 08/11/2015  210 > 11/10/2015 214     11/28/14 207 lb 6.4 oz (94.076 kg)  10/31/14 202 lb 9.6 oz (91.899 kg)  10/21/14 201 lb 1.9 oz (91.227 kg)    Vital signs reviewed    HEENT: nl dentition, turbinates, and orophanx. Nl external ear canals without cough reflex   NECK :  without JVD/Nodes/TM/ nl  carotid upstrokes bilaterally   LUNGS: no acc muscle use,  Very minimal insp and exp rhonchi bilaterally s cough on insp/exp    CV:  RRR  no s3 or murmur or increase in P2, no edema   ABD:  soft and nontender with nl excursion in the supine position. No bruits or organomegaly, bowel sounds nl  MS:  warm without deformities, calf tenderness, cyanosis or clubbing  SKIN: warm and dry without lesions    NEURO:  alert, approp, no deficits      CXR PA and Lateral:   08/11/2015 :    I personally reviewed images and agree with radiology impression as follows:    Decreased patchy and interstitial opacities within the mid lower lungs compatible with improving pneumonia.  COPD/emphysema and cardiomegaly.      Assessment:

## 2015-11-10 NOTE — Assessment & Plan Note (Signed)
Complicated by HBP/ Hyperlipidemia/ DM   Body mass index is 30.71    Lab Results  Component Value Date   TSH 1.303 09/07/2014     Contributing to gerd tendency/ doe/reviewed the need and the process to achieve and maintain neg calorie balance > defer f/u primary care including intermittently monitoring thyroid status

## 2015-11-10 NOTE — Assessment & Plan Note (Signed)
11/30/13  FEV1  1.76(57%) ratio 58 and no better p saba, dlco 78% - quit smoking sept 2015  - 09/13/2014 d/c'd  acei  - 08/11/2015  extensive coaching HFA effectiveness =    90%  - PFT's  11/09/2015  FEV1 1.86 (62 % ) ratio 61  p 7 % improvement from saba p symbicort 160 prior to study with DLCO  66/66c % corrects to 71 % for alv volume    Despite mild flare cough pfts better than 2015 and good chronic symptom control on symb 160 2bid with minimal saba and not needing gerd rx chronically   However,  Explained the natural history of uri and why it's necessary in patients at risk to treat GERD aggressively - at least  short term -   to reduce risk of evolving cyclical cough initially  triggered by epithelial injury and a heightened sensitivty to the effects of any upper airway irritants,  most importantly acid - related - then perpetuated by epithelial injury related to the cough itself as the upper airway collapses on itself.  That is, the more sensitive the epithelium becomes once it is damaged by the virus, the more the ensuing irritability> the more the cough, the more the secondary reflux (especially in those prone to reflux) the more the irritation of the sensitive mucosa and so on in a  Classic cyclical pattern.    I had an extended discussion with the patient reviewing all relevant studies completed to date and  lasting 15 to 20 minutes of a 25 minute visit    Each maintenance medication was reviewed in detail including most importantly the difference between maintenance and prns and under what circumstances the prns are to be triggered using an action plan format that is not reflected in the computer generated alphabetically organized AVS.    Please see instructions for details which were reviewed in writing and the patient given a copy highlighting the part that I personally wrote and discussed at today's ov.

## 2015-11-20 DIAGNOSIS — M79641 Pain in right hand: Secondary | ICD-10-CM | POA: Diagnosis not present

## 2015-11-20 DIAGNOSIS — M79642 Pain in left hand: Secondary | ICD-10-CM | POA: Diagnosis not present

## 2015-11-23 DIAGNOSIS — J209 Acute bronchitis, unspecified: Secondary | ICD-10-CM | POA: Diagnosis not present

## 2015-11-30 ENCOUNTER — Other Ambulatory Visit: Payer: Self-pay | Admitting: Internal Medicine

## 2015-12-13 DIAGNOSIS — E1140 Type 2 diabetes mellitus with diabetic neuropathy, unspecified: Secondary | ICD-10-CM | POA: Diagnosis not present

## 2015-12-13 DIAGNOSIS — Z7984 Long term (current) use of oral hypoglycemic drugs: Secondary | ICD-10-CM | POA: Diagnosis not present

## 2015-12-14 ENCOUNTER — Ambulatory Visit (INDEPENDENT_AMBULATORY_CARE_PROVIDER_SITE_OTHER): Payer: PPO | Admitting: Podiatry

## 2015-12-14 ENCOUNTER — Encounter: Payer: Self-pay | Admitting: Podiatry

## 2015-12-14 VITALS — BP 137/85 | HR 92 | Resp 14

## 2015-12-14 DIAGNOSIS — L03032 Cellulitis of left toe: Secondary | ICD-10-CM

## 2015-12-14 DIAGNOSIS — L600 Ingrowing nail: Secondary | ICD-10-CM | POA: Diagnosis not present

## 2015-12-14 MED ORDER — DOXYCYCLINE HYCLATE 100 MG PO TABS: 100.0000 mg | ORAL_TABLET | Freq: Two times a day (BID) | ORAL | Status: DC

## 2015-12-14 NOTE — Progress Notes (Signed)
Subjective:     Patient ID: [name], male   DOB: August 23, 1935, 80 y.o.   MRN: [id number]  HPI this patient presents to the office with chief complaint of a painful right big toe. He states that the nail was growing into the corner of his nail groove and he attempted to remove the nail. He states he has left a portion of the nail in the nail groove. The nail groove nail has become red, swollen and painful as he walks at the site of his nail remnant. This patient is a diabetic on medicine. He is scheduled for his regular appointment in 2 weeks, but he presents today due to the fact that he attempted to self treat himself at home   Review of Systems     Objective:   Physical Exam GENERAL APPEARANCE: Alert, conversant. Appropriately groomed. No acute distress.  VASCULAR: Pedal pulses are  palpable at  [facility] and PT bilateral.  Capillary refill time is immediate to all digits,  Normal temperature gradient.  Digital hair growth is present bilateral  NEUROLOGIC: sensation is diminished  to 5.07 monofilament at 5/5 sites bilateral.  Light touch is intact bilateral, Muscle strength normal.  MUSCULOSKELETAL: acceptable muscle strength, tone and stability bilateral.  Intrinsic muscluature intact bilateral.  Rectus appearance of foot and digits noted bilateral. Hammer toes 2-4 B/L.  DERMATOLOGIC: skin color, texture, and turgor are within normal limits.  No preulcerative lesions or ulcers  are seen, no interdigital maceration noted.  No open lesions present.  . No drainage noted.   NAILS  Nail spicule noted lateral aspect right great toe with redness and swelling.  Pus noted in distal nail groove.      Assessment:     Paronychia lateral border right great toe.     Plan:     Incision and Drainage lateral border right great toe.  Home instructions given.  RTC prn.  Prescribed doxycycline.   Gardiner Barefoot DPM

## 2015-12-25 DIAGNOSIS — I723 Aneurysm of iliac artery: Secondary | ICD-10-CM | POA: Diagnosis not present

## 2015-12-25 DIAGNOSIS — I10 Essential (primary) hypertension: Secondary | ICD-10-CM | POA: Diagnosis not present

## 2015-12-25 DIAGNOSIS — E1140 Type 2 diabetes mellitus with diabetic neuropathy, unspecified: Secondary | ICD-10-CM | POA: Diagnosis not present

## 2015-12-25 DIAGNOSIS — J449 Chronic obstructive pulmonary disease, unspecified: Secondary | ICD-10-CM | POA: Diagnosis not present

## 2015-12-25 DIAGNOSIS — E785 Hyperlipidemia, unspecified: Secondary | ICD-10-CM | POA: Diagnosis not present

## 2015-12-28 ENCOUNTER — Encounter: Payer: Self-pay | Admitting: Podiatry

## 2015-12-28 ENCOUNTER — Ambulatory Visit (INDEPENDENT_AMBULATORY_CARE_PROVIDER_SITE_OTHER): Payer: PPO | Admitting: Podiatry

## 2015-12-28 DIAGNOSIS — B351 Tinea unguium: Secondary | ICD-10-CM

## 2015-12-28 DIAGNOSIS — M79676 Pain in unspecified toe(s): Secondary | ICD-10-CM | POA: Diagnosis not present

## 2015-12-28 DIAGNOSIS — E1140 Type 2 diabetes mellitus with diabetic neuropathy, unspecified: Secondary | ICD-10-CM | POA: Diagnosis not present

## 2015-12-28 NOTE — Progress Notes (Signed)
Patient ID: [name], male   DOB: 09/13/1935, 80 y.o.   MRN: [id number] Complaint:  Visit Type: Patient returns to my office for continued preventative foot care services. Complaint: Patient states" my nails have grown long and thick and become painful to walk and wear shoes" Patient has been diagnosed with DM with neuropathy. He presents for preventative foot care services. No changes to ROS  Podiatric Exam: Vascular: dorsalis pedis and posterior tibial pulses are palpable bilateral. Capillary return is immediate. Temperature gradient is WNL. Skin turgor WNL  Sensorium: Diminishedl Semmes Weinstein monofilament test. Normal tactile sensation bilaterally. Nail Exam: Pt has thick disfigured discolored nails with subungual debris noted bilateral entire nail hallux through fifth toenails Ulcer Exam: There is no evidence of ulcer or pre-ulcerative changes or infection. Orthopedic Exam: Muscle tone and strength are WNL. No limitations in general ROM. No crepitus or effusions noted. Foot type and digits show no abnormalities. Bony prominences are unremarkable.Hammer toes 2-5 B/L Skin: No Porokeratosis. No infection or ulcers  Diagnosis:  Tinea unguium, Pain in right toe, pain in left toes  Treatment & Plan Procedures and Treatment: Consent by patient was obtained for treatment procedures. The patient understood the discussion of treatment and procedures well. All questions were answered thoroughly reviewed. Debridement of mycotic and hypertrophic toenails, 1 through 5 bilateral and clearing of subungual debris. No ulceration, no infection noted.  Return Visit-Office Procedure: Patient instructed to return to the office for a follow up visit 3 months for continued evaluation and treatment.  Gardiner Barefoot DPM

## 2016-01-03 ENCOUNTER — Other Ambulatory Visit: Payer: Self-pay | Admitting: Internal Medicine

## 2016-02-01 ENCOUNTER — Other Ambulatory Visit: Payer: Self-pay | Admitting: Internal Medicine

## 2016-02-05 DIAGNOSIS — J441 Chronic obstructive pulmonary disease with (acute) exacerbation: Secondary | ICD-10-CM | POA: Diagnosis not present

## 2016-02-09 DIAGNOSIS — R05 Cough: Secondary | ICD-10-CM | POA: Diagnosis not present

## 2016-02-18 DIAGNOSIS — R05 Cough: Secondary | ICD-10-CM | POA: Diagnosis not present

## 2016-02-23 DIAGNOSIS — R05 Cough: Secondary | ICD-10-CM | POA: Diagnosis not present

## 2016-02-29 ENCOUNTER — Ambulatory Visit (INDEPENDENT_AMBULATORY_CARE_PROVIDER_SITE_OTHER)
Admission: RE | Admit: 2016-02-29 | Discharge: 2016-02-29 | Disposition: A | Discharge status: Home / Self Care | Payer: PPO | Source: Ambulatory Visit | Attending: Internal Medicine | Admitting: Internal Medicine

## 2016-02-29 ENCOUNTER — Encounter: Payer: Self-pay | Admitting: Internal Medicine

## 2016-02-29 ENCOUNTER — Ambulatory Visit (INDEPENDENT_AMBULATORY_CARE_PROVIDER_SITE_OTHER): Payer: PPO | Admitting: Internal Medicine

## 2016-02-29 ENCOUNTER — Other Ambulatory Visit: Payer: Self-pay | Admitting: Internal Medicine

## 2016-02-29 VITALS — BP 134/76 | HR 102 | Ht 70.0 in | Wt 214.0 lb

## 2016-02-29 DIAGNOSIS — J449 Chronic obstructive pulmonary disease, unspecified: Secondary | ICD-10-CM | POA: Diagnosis not present

## 2016-02-29 DIAGNOSIS — R0602 Shortness of breath: Secondary | ICD-10-CM | POA: Diagnosis not present

## 2016-02-29 DIAGNOSIS — R05 Cough: Secondary | ICD-10-CM | POA: Diagnosis not present

## 2016-02-29 MED ORDER — VALSARTAN 160 MG PO TABS: 160.0000 mg | ORAL_TABLET | Freq: Every day | ORAL | 0 refills | Status: DC

## 2016-02-29 MED ORDER — METHYLPREDNISOLONE ACETATE 80 MG/ML IJ SUSP
120.0000 mg | Freq: Once | INTRAMUSCULAR | Status: AC
  Administered 2016-02-29: 120 mg via INTRAMUSCULAR

## 2016-02-29 NOTE — Patient Instructions (Addendum)
Try prilosec otc 20mg  1-2  Take 30-60 min before first meal of the day and Pepcid ac (famotidine) 20 mg one @  bedtime until cough is completely gone for at least a week without the need for cough suppression - if cough recurs restart immediately at 2 prilosec until you turn the corner   Depomedrol 120 mg IM      Plan A = Automatic =  symbicort 160 Take 2 puffs first thing in am and then another 2 puffs about 12 hours later.   Work on inhaler technique:  relax and gently blow all the way out then take a nice smooth deep breath back in, triggering the inhaler at same time you start breathing in.  Hold for up to 5 seconds if you can. Blow out thru nose. Rinse and gargle with water when done      Plan B = Backup Only use your albuterol as a rescue medication to be used if you can't catch your breath by resting or doing a relaxed purse lip breathing pattern.  - The less you use it, the better it will work when you need it. - Ok to use the inhaler up to 2 puffs  every 4 hours if you must but call for appointment if use goes up over your usual need - Don't leave home without it !!  (think of it like the spare tire for your car)   Please remember to go to the   x-ray department downstairs for your tests - we will call you with the results when they are available.    We will refill your valsartan x 3 months but after that you should let your primary doctor take over this  Change the follow up to schedule at 6 months

## 2016-02-29 NOTE — Progress Notes (Signed)
Subjective:     Patient ID: [name], male   DOB: 02-09-1936    MRN: [id number]    Brief patient profile:  59 yowm quit smoking at dx of pna 03/2014 maintiained on advair since mid 2000s with confirmed GOLD II copd 11/30/13 referred to pulmonary clinic 09/13/2014  by Dr Darcus Austin for new dx of pna with flare of symptoms Aug 21 2014  Admit date: 09/07/2014 Discharge date: 09/09/2014   DISCHARGE DIAGNOSES:  Principal Problem:  CAP (community acquired pneumonia) Active Problems:  Chronic tension headaches  Diabetes mellitus type 2 with complications  Hyponatremia  Hyperlipidemia  COPD (chronic obstructive pulmonary disease)  Leukocytosis  Anemia      Filed Weights   09/07/14 1307  Weight: 88.905 kg (196 lb)    INITIAL HISTORY: 80 year old male of [place] descent with a past medical history of COPD, asthma and diabetes who presented with a 3 week history of worsening shortness of breath and cough. He was initially treated as an outpatient with steroid for bronchitis. With no improvement, he decided to come into the hospital. He was detected to have multilobar pneumonia in the ED. He was subsequently admitted to the hospital.    HOSPITAL COURSE:   Multilobar community-acquired pneumonia Patient was placed on intravenous antibiotics. He improved rather quickly. Cultures have been negative. He is feeling better. He is saturating normal on room air. Strep and legionella antigens are negative. Denies any nausea, vomiting. He was changed over to oral antibiotics. This morning, and then was discharged.          09/13/2014 [facility] office visit/ Meggie Laseter   Chief Complaint  Patient presents with  . Advice Only    referred by Dr. Darcus Austin, Pneumonia   onset was abrupt on Super bowl Sunday/ cough/congestion and increase need for albterol/ mucus mostly white, never high fever admitted Last levaquin Feb 29, feeling back to baseline = doe with more than slow  adls/ chronic dry daytime cough, neither  cough or sob better with saba/ still on advair 250 bid. >>d/c advair and ACE , change to symbicort and diovan       10/31/14 NP Folllow up PNA /COPD  Pt returns for a check up.  Seen last ov for a post hospital follow up .  Admitted for CAP with multilobar PNA.  tx w/ abx and steroids.  He is feeling much better. Gotten back to his exercise.  However started having some increased cough and congestion that is white over last 2-3 days.  Wanted to make sure he is not developing PNA again.  ESR last ov was slightly up at 32.  No chest pain, orthopnea, edema , hemoptysis , fever or discolored mucus.  rec Mucinex DM Twice daily  As needed  Cough and congestion .  Fluids and rest  Zpack to have on hold if symptoms worsen with discolored mucus .     11/28/2014 f/u ov/Tevita Gomer re: GOLD II copd Dellis Anes 160 2bid  Chief Complaint  Patient presents with  . Follow-up    Pt states breathing is overall doing well. Still has some cough- prod with minimal white sputum.  He has only used albuterol 1-2 x in the past month.   Not limited by breathing from desired activities   Mostly cough in am/ rarely wakes him up  rec Work on inhaler technique:   Add pepcid 20 mg one at bedtime until am cough is gone  GERD  Diet    04/20/2015  f/u ov/Aerielle Stoklosa re: recurrent cough x one week on symbicort 160 2 bid though hfa 50%  Chief Complaint  Patient presents with  . Follow-up    COPD. Pt states his breathing is doing well. Pt states that he did use albuterol twice yesterday due to his symptoms. Pt c/o of SOB, productive cough with white mucus, phlegm in his throat. Pt denies any f/n/v/d, CP/tightness.    Onset was acute/ now needing saba with immediate relief, did not restart pepcid as rec at onset of cough  rec zpak > go ahead and take it Work on inhaler technique:  Try prilosec otc 40m  Take 30-60 min before first meal of the day and Pepcid ac (famotidine) 20 mg one @   bedtime until cough is completely gone for at least a week without the need for cough suppression GERD diet    06/30/15 cough/ low grade fever no better on zpak >  Be sure he's taking prilosec Take 30- 60 min before your first and last meals of the day > was not  Prednisone 10 mg take  4 each am x 2 days,   2 each am x 2 days,  1 each am x 2 days and stop  Levaquin 500 mg daily x 7 days   07/03/2015 acute extended ov/Zacory Fiola re: f/u ? pna  Chief Complaint  Patient presents with  . Acute Visit    Pt states developed fever and chills 6 days ago. He feels very fatigued and achy. He has had some cough- non very prod. He has had some bloody nasal d/c.    had been doing a lot better until 6 days prior to OV  And starting to feel better in last 24 h on levaquin  rec If not improving go ahead and take the prednisone we called in  Work on inhaler technique:  relax and gently blow all the way out then take a nice smooth deep breath back in, triggering the inhaler at same time you start breathing in.  Hold for up to 5 seconds if you can. Blow out thru nose. Rinse and gargle with water when done Try prilosec otc 23m Take 30-60 min before first and last meals of the day  until cough is completely gone for at least a week without the need for cough suppression GERD diet   For cough take mucinex dm up to 1200 mg every 12 hours as needed    08/11/2015  f/u ov/Mouhamed Glassco re: GOLD II / f/u pneumonia   Chief Complaint  Patient presents with  . Follow-up    Pt c/o cough at nighttime and some SOB with exertion. Pt is using a stationary bike half an hour per day with no SOB.    Overall quite a bit better and not really limited by breathing from daily activities on symb 160 2bid / rare need for saba  rec Add pepcid ac 20 mg at bedtime and try off prilosec   11/09/2015  f/u ov/Carlesha Seiple re: GOLD II maint rx symb 160 2bid / prn saba / no longer on any gerd rx  Chief Complaint  Patient presents with  . Follow-up     Had PFT today-had a cough last 3-4 days white sm. amt.,Using exercise bicycle.Sob same.Used Albuterol one time this wk.  doe = MMRC1 = can walk nl pace, flat grade, can't hurry or go uphills or steps s sob   rec Try prilosec otc 2064mTake 30-60 min before first meal  of the day and Pepcid ac (famotidine) 20 mg one @  bedtime until cough is completely gone for at least a week without the need for cough suppression Plan A = Automatic =  symbicort 160 Take 2 puffs first thing in am and then another 2 puffs about 12 hours later.  Plan B = Backup Only use your albuterol as a rescue medication   Flare cough x mid July 2017  rx zpak/ nasal spray only    02/29/2016  f/u ov/Masayoshi Couzens re: GOLD II  Copd/ worse cough/ sob/ did not resume gerd rx/   on symbicort 160 2bid maint Chief Complaint  Patient presents with  . Follow-up    pt c/o worsening cough, PND, sob X4 wks.  cough is sometimes productive.  Pt went to pcp for this, given zpak.    cough coarse hs > white mucus only/ some saba but more than twice weekly  No problem with ex tol despite cough       No obvious day to day or daytime variabilty or assoc excess/ purulent sputum or mucus plugs    cp or chest tightness, subjective wheeze overt sinus or hb symptoms. No unusual exp hx or h/o childhood pna/ asthma or knowledge of premature birth.  Sleeping ok without nocturnal  or early am exacerbation  of respiratory  c/o's or need for noct saba. Also denies any obvious fluctuation of symptoms with weather or environmental changes or other aggravating or alleviating factors except as outlined above   Current Medications, Allergies, Complete Past Medical History, Past Surgical History, Family History, and Social History were reviewed in [employer] record.  ROS  The following are not active complaints unless bolded sore throat, dysphagia, dental problems, itching, sneezing,  nasal congestion or excess/ purulent secretions, ear  ache,   fever, chills, sweats, unintended wt loss, pleuritic or exertional cp, hemoptysis,  orthopnea pnd or leg swelling, presyncope, palpitations, heartburn, abdominal pain, anorexia, nausea, vomiting, diarrhea  or change in bowel or urinary habits, change in stools or urine, dysuria,hematuria,  rash, arthralgias, visual complaints, headache, numbness weakness or ataxia or problems with walking or coordination,  change in mood/affect or memory.               Objective:   Physical Exam  amb wm nad   04/20/2015        212  >  07/03/2015    209 > 08/11/2015  210 > 11/10/2015 214 > 02/29/2016 215     11/28/14 207 lb 6.4 oz (94.076 kg)  10/31/14 202 lb 9.6 oz (91.899 kg)  10/21/14 201 lb 1.9 oz (91.227 kg)    Vital signs reviewed    HEENT: nl dentition, turbinates, and orophanx. Nl external ear canals without cough reflex   NECK :  without JVD/Nodes/TM/ nl carotid upstrokes bilaterally   LUNGS: no acc muscle use,  Very minimal late  exp rhonchi bilaterally s cough on insp/exp    CV:  RRR  no s3 or murmur or increase in P2, no edema   ABD:  soft and nontender with nl excursion in the supine position. No bruits or organomegaly, bowel sounds nl  MS:  warm without deformities, calf tenderness, cyanosis or clubbing  SKIN: warm and dry without lesions    NEURO:  alert, approp, no deficits     CXR PA and Lateral:   02/29/2016 :    I personally reviewed images and agree with radiology impression as follows:  No active disease. Stable hyperinflation and chronic mild interstitial prominence. Bilateral basilar scarring again noted.       Assessment:

## 2016-03-01 NOTE — Assessment & Plan Note (Signed)
11/30/13  FEV1  1.76(57%) ratio 58 and no better p saba, dlco 78% - quit smoking sept 2015  - 09/13/2014 d/c'd  acei   - PFT's  11/09/2015  FEV1 1.86 (62 % ) ratio 61  p 7 % improvement from saba p symbicort 160 prior to study with DLCO  66/66c % corrects to 71 % for alv volume - 02/29/2016  After extensive coaching HFA effectiveness =    90%    DDX of  difficult airways management almost all start with A and  include Adherence, Ace Inhibitors, Acid Reflux, Active Sinus Disease, Alpha 1 Antitripsin deficiency, Anxiety masquerading as Airways dz,  ABPA,  Allergy(esp in young), Aspiration (esp in elderly), Adverse effects of meds,  Active smokers, A bunch of PE's (a small clot burden can't cause this syndrome unless there is already severe underlying pulm or vascular dz with poor reserve) plus two Bs  = Bronchiectasis and Beta blocker use..and one C= CHF   Adherence is always the initial "prime suspect" and is a multilayered concern that requires a "trust but verify" approach in every patient - starting with knowing how to use medications, especially inhalers, correctly, keeping up with refills and understanding the fundamental difference between maintenance and prns vs those medications only taken for a very short course and then stopped and not refilled.  - - The proper method of use, as well as anticipated side effects, of a metered-dose inhaler are discussed and demonstrated to the patient. Improved effectiveness after extensive coaching during this visit to a level of approximately 90 % from a baseline of 75 % > continue symbicort    ? Acid (or non-acid) GERD > always difficult to exclude as up to 75% of pts in some series report no assoc GI/ Heartburn symptoms> rec max (24h)  acid suppression and diet restrictions/ reviewed and instructions given in writing.   ? Allergy/asthma component > depomedrol 120 mg im/ no change maint rx  I had an extended discussion with the patient reviewing all relevant  studies completed to date and  lasting 15 to 20 minutes of a 25 minute visit    Each maintenance medication was reviewed in detail including most importantly the difference between maintenance and prns and under what circumstances the prns are to be triggered using an action plan format that is not reflected in the computer generated alphabetically organized AVS.    Please see instructions for details which were reviewed in writing and the patient given a copy highlighting the part that I personally wrote and discussed at today's ov.

## 2016-03-07 ENCOUNTER — Encounter: Payer: Self-pay | Admitting: Podiatry

## 2016-03-07 ENCOUNTER — Ambulatory Visit (INDEPENDENT_AMBULATORY_CARE_PROVIDER_SITE_OTHER): Payer: PPO | Admitting: Podiatry

## 2016-03-07 DIAGNOSIS — B351 Tinea unguium: Secondary | ICD-10-CM | POA: Diagnosis not present

## 2016-03-07 DIAGNOSIS — M79676 Pain in unspecified toe(s): Secondary | ICD-10-CM | POA: Diagnosis not present

## 2016-03-07 DIAGNOSIS — E1140 Type 2 diabetes mellitus with diabetic neuropathy, unspecified: Secondary | ICD-10-CM

## 2016-03-07 NOTE — Progress Notes (Signed)
Patient ID: [name], male   DOB: 1935-10-26, 80 y.o.   MRN: [id number] Complaint:  Visit Type: Patient returns to my office for continued preventative foot care services. Complaint: Patient states" my nails have grown long and thick and become painful to walk and wear shoes" Patient has been diagnosed with DM with neuropathy. He presents for preventative foot care services. No changes to ROS  Podiatric Exam: Vascular: dorsalis pedis and posterior tibial pulses are palpable bilateral. Capillary return is immediate. Temperature gradient is WNL. Skin turgor WNL  Sensorium: Diminishedl Semmes Weinstein monofilament test. Normal tactile sensation bilaterally. Nail Exam: Pt has thick disfigured discolored nails with subungual debris noted bilateral entire nail hallux through fifth toenails Ulcer Exam: There is no evidence of ulcer or pre-ulcerative changes or infection. Orthopedic Exam: Muscle tone and strength are WNL. No limitations in general ROM. No crepitus or effusions noted. Foot type and digits show no abnormalities. Bony prominences are unremarkable.Hammer toes 2-5 B/L Skin: No Porokeratosis. No infection or ulcers  Diagnosis:  Tinea unguium, Pain in right toe, pain in left toes  Treatment & Plan Procedures and Treatment: Consent by patient was obtained for treatment procedures. The patient understood the discussion of treatment and procedures well. All questions were answered thoroughly reviewed. Debridement of mycotic and hypertrophic toenails, 1 through 5 bilateral and clearing of subungual debris. No ulceration, no infection noted.  Return Visit-Office Procedure: Patient instructed to return to the office for a follow up visit 3 months for continued evaluation and treatment.  Gardiner Barefoot DPM

## 2016-03-28 ENCOUNTER — Encounter: Payer: Self-pay | Admitting: Family

## 2016-04-02 ENCOUNTER — Ambulatory Visit (INDEPENDENT_AMBULATORY_CARE_PROVIDER_SITE_OTHER): Payer: PPO | Admitting: Family

## 2016-04-02 ENCOUNTER — Encounter: Payer: Self-pay | Admitting: Vascular Surgery

## 2016-04-02 ENCOUNTER — Encounter: Payer: Self-pay | Admitting: Family

## 2016-04-02 VITALS — BP 126/78 | HR 91 | Temp 98.9°F | Resp 16 | Ht 70.0 in | Wt 210.0 lb

## 2016-04-02 DIAGNOSIS — I728 Aneurysm of other specified arteries: Secondary | ICD-10-CM

## 2016-04-02 DIAGNOSIS — I723 Aneurysm of iliac artery: Secondary | ICD-10-CM | POA: Diagnosis not present

## 2016-04-02 NOTE — Progress Notes (Signed)
[facility]   CC: Follow up common iliac artery Aneurysm  History of Present Illness  [name] is a 80 y.o. (May 03, 1936) male patient of Dr. 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 He has had no abdominal or back symptoms. This was found incidentally during evaluation of hematuria. He does have some right hip problems; he was evaluated by an orthopod, pt sates his hip is fine, states he was told that he has some arthritis in his lower back causing the mild intermittent right hip pain.  Dr. Kellie Simmering last evaluated pt on 03/29/14. At that time CT angiogram of the abdomen and pelvis demonstrated right common iliac artery aneurysm as unchanged at 2.6 cm. The area of slight dilatation in the celiac axis was 14 mm and unchanged.  He returns for evaluation prior to 2 year repeat of CTA.   The patient denies claudication in legs with walking. The patient denies history of stroke or TIA symptoms.  Pt Diabetic: Yes, states his last A1C was 6.6 Pt smoker: former smoker, quit in September 2015, smoked x 60 years   Past Medical History:  Diagnosis Date  . Asthma   . Bronchitis   . COPD (chronic obstructive pulmonary disease) ([place])   . Degenerative cervical disc   . Diabetes mellitus   . Hematoma    left subdoral  . Hypercholesterolemia   . Hypertension   . Iliac artery aneurysm ([place])   . Lung nodule   . Macular degeneration   . Peripheral neuropathy ([place])   . Progressive gait disorder   . 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 Skull fracture ([place])   . Subdural hematoma ([facility])    No past surgical history on file. Social History Social History   Social History  . Marital status: Married    Spouse name: [name]  . Number of children: 3  . Years of education: 64   Occupational History  . Retired    Social History Main Topics  . Smoking status:  Former Smoker    Packs/day: 0.50    Years: 60.00    Types: Cigarettes    Quit date: 04/07/2014  . Smokeless tobacco: Never Used  . Alcohol use 7.0 oz/week    14 Standard drinks or equivalent per week     Comment: 1-2 before dinner  . Drug use: No  . Sexual activity: Not on file   Other Topics Concern  . Not on file   Social History Narrative   Patient is married [name]).   Patient has three children and 7 grandchildren.   Patient is retired.   Patient drinks one serving of coffee daily.   Patient is right-handed.   Patient has a college education.   Family History Family History  Problem Relation Age of Onset  . Stroke Father     Current Outpatient Prescriptions on File Prior to Visit  Medication Sig Dispense Refill  . albuterol (PROVENTIL HFA;VENTOLIN HFA) 108 (90 BASE) MCG/ACT inhaler Inhale 2 puffs into the lungs every 6 (six) hours as needed for wheezing or shortness of breath.    . Ascorbic Acid (VITAMIN C PO) Take 1 tablet by mouth daily.    Marland Kitchen aspirin 81 MG tablet Take 81 mg by mouth daily.    Marland Kitchen atorvastatin (LIPITOR) 10 MG tablet Take 10 mg by mouth daily.    . cetirizine (ZYRTEC) 10 MG tablet Take 10  mg by mouth as needed for allergies.    . finasteride (PROSCAR) 5 MG tablet Take 5 mg by mouth daily.    Marland Kitchen glipiZIDE (GLUCOTROL XL) 5 MG 24 hr tablet Take 5 mg by mouth daily with breakfast. x30 days per PCP    . metFORMIN (GLUCOPHAGE) 500 MG tablet Take 1 tablet (500 mg total) by mouth 2 (two) times daily with a meal. Resume tonight.    . Multiple Vitamin (MULTIVITAMIN WITH MINERALS) TABS Take 1 tablet by mouth daily.    Marland Kitchen omeprazole (PRILOSEC) 40 MG capsule Take 40 mg by mouth daily.    . ONE TOUCH ULTRA TEST test strip     . ONETOUCH DELICA LANCETS 99991111 MISC     . sildenafil (VIAGRA) 50 MG tablet Take 50 mg by mouth daily as needed for erectile dysfunction.    . SYMBICORT 160-4.5 MCG/ACT inhaler INHALE 2 PUFFS INTO THE LUNGS 2 TIMES DAILY 1 Inhaler 11  . Tamsulosin HCl  (FLOMAX) 0.4 MG CAPS Take 0.4 mg by mouth daily.    . valsartan (DIOVAN) 160 MG tablet Take 1 tablet (160 mg total) by mouth daily. 90 tablet 0  . valsartan (DIOVAN) 160 MG tablet TAKE ONE TABLET BY MOUTH ONCE DAILY. 30 tablet 3   No current facility-administered medications on file prior to visit.    Allergies  Allergen Reactions  . Pneumovax 23 [Pneumococcal Vac Polyvalent]     Fever, sweats  . Prednisone Other (See Comments)    Sweating with high dose  . Prevnar 13 [Pneumococcal 13-Val Conj Vacc] Swelling  . Penicillins Rash  . Sulfa Antibiotics Rash    ROS: See HPI for pertinent positives and negatives.  Physical Examination  Vitals:   04/02/16 1243  BP: 126/78  Pulse: 91  Resp: 16  Temp: 98.9 F (37.2 C)  SpO2: 97%  Weight: 210 lb (95.3 kg)  Height: 5\' 10"  (1.778 m)   Body mass index is 30.13 kg/m.  General: A&O x 3, WD, obese male.  Pulmonary: Sym exp, respirations are non labored, good air movt, CTAB, no rales, rhonchi, or wheezing.  Cardiac: RRR, Nl S1, S2, no detected murmur.   Carotid Bruits Right Left   Negative Negative   Aorta is not palpable Radial pulses are 2+ palpable                          VASCULAR EXAM:                                                                                                         LE Pulses Right Left       FEMORAL  not palpable (obese)  not palpable (obese)        POPLITEAL  not palpable   2+ palpable       POSTERIOR TIBIAL  2+ palpable   2+ palpable        DORSALIS PEDIS      ANTERIOR TIBIAL 2+ palpable  2+ palpable      Gastrointestinal: soft,  NTND, -G/R, - HSM, - masses palpated, - CVAT B.  Musculoskeletal: M/S 5/5 throughout, Extremities without ischemic changes.  Neurologic: CN 2-12 intact, Pain and light touch intact in extremities are intact, Motor exam as listed above.    Medical Decision Making  The patient is a 80 y.o. 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 His blood pressure is in good control and he has not used tobacco since September of 2015.    Based on this patient's exam and diagnostic studies, the patient will follow up in within 4 weeks  with the following studies: CTA abd/pelvis for evaluation of right CIA and celiac artery aneurysms, see me afterward for discussion of results on a day that Dr. Donzetta Matters is in the office.  Consideration for repair of common iliac artery aneurysm would be at 3.0 cm or greater.   I emphasized the importance of maximal medical management including strict control of blood pressure, blood glucose, and lipid levels, antiplatelet agents, obtaining regular exercise, and continued cessation of smoking.   The patient was given information about AAA including signs, symptoms, treatment, and how to minimize the risk of enlargement and rupture of aneurysms.    The patient was advised to call 911 should the patient experience sudden onset abdominal or back pain.   Thank you for allowing [place] to participate in this patient's care.  Clemon Chambers, RN, MSN, FNP-C [facility] Office: [phone]  Clinic Physician: Early  04/02/2016, 12:46 PM

## 2016-04-05 ENCOUNTER — Telehealth: Payer: Self-pay | Admitting: Internal Medicine

## 2016-04-05 MED ORDER — PREDNISONE 10 MG PO TABS: ORAL_TABLET | ORAL | 0 refills | Status: DC

## 2016-04-05 MED ORDER — AZITHROMYCIN 250 MG PO TABS: ORAL_TABLET | ORAL | 0 refills | Status: DC

## 2016-04-05 NOTE — Telephone Encounter (Signed)
zpak Prednisone 10 mg take  4 each am x 2 days,   2 each am x 2 days,  1 each am x 2 days and stop  

## 2016-04-05 NOTE — Telephone Encounter (Signed)
Per 02/29/16 OV: Try prilosec otc 20mg  1-2  Take 30-60 min before first meal of the day and Pepcid ac (famotidine) 20 mg one @  bedtime until cough is completely gone for at least a week without the need for cough suppression - if cough recurs restart immediately at 2 prilosec until you turn the corner  Depomedrol 120 mg IM  Plan A = Automatic =  symbicort 160 Take 2 puffs first thing in am and then another 2 puffs about 12 hours later.  Work on inhaler technique:  relax and gently blow all the way out then take a nice smooth deep breath back in, triggering the inhaler at same time you start breathing in.  Hold for up to 5 seconds if you can. Blow out thru nose. Rinse and gargle with water when done Plan B = Backup Only use your albuterol as a rescue medication to be used if you can't catch your breath by resting or doing a relaxed purse lip breathing pattern.  - The less you use it, the better it will work when you need it. - Ok to use the inhaler up to 2 puffs  every 4 hours if you must but call for appointment if use goes up over your usual need - Don't leave home without it !!  (think of it like the spare tire for your car)  Please remember to go to the   x-ray department downstairs for your tests - we will call you with the results when they are available. We will refill your valsartan x 3 months but after that you should let your primary doctor take over this Change the follow up to schedule at 6 months ----  Called spoke with pt. C/o prod cough (white phlem), wheezing, chest tightness. Requesting something to be called in.  He has scheduled appt 04/09/16 since nothing available today. Please advise MW thanks

## 2016-04-05 NOTE — Telephone Encounter (Signed)
Called spoke with patient, advised of MW's recommendations as dictated below Pt voiced his understanding and also voiced his concern about taking oral prednisone and it raising his blood sugar.  Pt did state that the last time he had an exacerbation he got a depo and this did not raise his blood sugar.  Pt will still pick up his pred taper and take his zpak and appt was scheduled for this Monday 9/25 @ 1045 w/ MW.  Pt happy with this and will call the office or seek emergency attention if needed for worsening symptoms.  Rx's sent to verified pharmacy Nothing further needed at this time; will sign off

## 2016-04-08 ENCOUNTER — Encounter: Payer: Self-pay | Admitting: Internal Medicine

## 2016-04-08 ENCOUNTER — Ambulatory Visit (INDEPENDENT_AMBULATORY_CARE_PROVIDER_SITE_OTHER): Payer: PPO | Admitting: Internal Medicine

## 2016-04-08 ENCOUNTER — Other Ambulatory Visit: Payer: Self-pay | Admitting: Vascular Surgery

## 2016-04-08 VITALS — BP 122/82 | HR 101 | Temp 98.0°F | Ht 70.0 in | Wt 220.4 lb

## 2016-04-08 DIAGNOSIS — J449 Chronic obstructive pulmonary disease, unspecified: Secondary | ICD-10-CM | POA: Diagnosis not present

## 2016-04-08 LAB — NITRIC OXIDE: NITRIC OXIDE: 22

## 2016-04-08 MED ORDER — FAMOTIDINE 20 MG PO TABS: ORAL_TABLET | ORAL | Status: DC

## 2016-04-08 MED ORDER — PANTOPRAZOLE SODIUM 40 MG PO TBEC: 40.0000 mg | DELAYED_RELEASE_TABLET | Freq: Every day | ORAL | 2 refills | Status: DC

## 2016-04-08 MED ORDER — METHYLPREDNISOLONE ACETATE 80 MG/ML IJ SUSP
120.0000 mg | Freq: Once | INTRAMUSCULAR | Status: AC
  Administered 2016-04-08: 120 mg via INTRAMUSCULAR

## 2016-04-08 NOTE — Progress Notes (Signed)
Subjective:     Patient ID: [name], male   DOB: 02/25/1936    MRN: [id number]   Brief patient profile:  62 yowm quit smoking at dx of pna 03/2014 maintiained on advair since mid 2000s with confirmed GOLD II copd 11/30/13 referred to pulmonary clinic 09/13/2014  by Dr Darcus Austin for new dx of pna with flare of symptoms Aug 21 2014  Admit date: 09/07/2014 Discharge date: 09/09/2014   DISCHARGE DIAGNOSES:  Principal Problem:  CAP (community acquired pneumonia) Active Problems:  Chronic tension headaches  Diabetes mellitus type 2 with complications  Hyponatremia  Hyperlipidemia  COPD (chronic obstructive pulmonary disease)  Leukocytosis  Anemia      Filed Weights   09/07/14 1307  Weight: 88.905 kg (196 lb)    INITIAL HISTORY: 80 year old male of [place] descent with a past medical history of COPD, asthma and diabetes who presented with a 3 week history of worsening shortness of breath and cough. He was initially treated as an outpatient with steroid for bronchitis. With no improvement, he decided to come into the hospital. He was detected to have multilobar pneumonia in the ED. He was subsequently admitted to the hospital.    HOSPITAL COURSE:   Multilobar community-acquired pneumonia Patient was placed on intravenous antibiotics. He improved rather quickly. Cultures have been negative. He is feeling better. He is saturating normal on room air. Strep and legionella antigens are negative. Denies any nausea, vomiting. He was changed over to oral antibiotics. This morning, and then was discharged.          09/13/2014 1st [facility] office visit/ Katherine Syme   Chief Complaint  Patient presents with  . Advice Only    referred by Dr. Darcus Austin, Pneumonia   onset was abrupt on Super bowl Sunday/ cough/congestion and increase need for albterol/ mucus mostly white, never high fever admitted Last levaquin Feb 29, feeling back to baseline = doe with more than slow  adls/ chronic dry daytime cough, neither  cough or sob better with saba/ still on advair 250 bid. >>d/c advair and ACE , change to symbicort and diovan       10/31/14 NP Fol[place]ow up PNA /COPD  Pt returns for a check up.  Seen last ov for a post hospital follow up .  Admitted for CAP with multilobar PNA.  tx w/ abx and steroids.  He is feeling much better. Gotten back to his exercise.  However started having some increased cough and congestion that is white over last 2-3 days.  Wanted to make sure he is not developing PNA again.  ESR last ov was slightly up at 32.  No chest pain, orthopnea, edema , hemoptysis , fever or discolored mucus.  rec Mucinex DM Twice daily  As needed  Cough and congestion .  Fluids and rest  Zpack to have on hold if symptoms worsen with discolored mucus .     11/28/2014 f/u ov/Alaura Schippers re: GOLD II copd Dellis Anes 160 2bid  Chief Complaint  Patient presents with  . Follow-up    Pt states breathing is overall doing well. Still has some cough- prod with minimal white sputum.  He has only used albuterol 1-2 x in the past month.   Not limited by breathing from desired activities   Mostly cough in am/ rarely wakes him up  rec Work on inhaler technique:   Add pepcid 20 mg one at bedtime until am cough is gone  GERD  Diet    04/20/2015  f/u ov/Angelica Frandsen re: recurrent cough x one week on symbicort 160 2 bid though hfa 50%  Chief Complaint  Patient presents with  . Follow-up    COPD. Pt states his breathing is doing well. Pt states that he did use albuterol twice yesterday due to his symptoms. Pt c/o of SOB, productive cough with white mucus, phlegm in his throat. Pt denies any f/n/v/d, CP/tightness.    Onset was acute/ now needing saba with immediate relief, did not restart pepcid as rec at onset of cough  rec zpak > go ahead and take it Work on inhaler technique:  Try prilosec otc 40m  Take 30-60 min before first meal of the day and Pepcid ac (famotidine) 20 mg one @   bedtime until cough is completely gone for at least a week without the need for cough suppression GERD diet    06/30/15 cough/ low grade fever no better on zpak >  Be sure he's taking prilosec Take 30- 60 min before your first and last meals of the day > was not  Prednisone 10 mg take  4 each am x 2 days,   2 each am x 2 days,  1 each am x 2 days and stop  Levaquin 500 mg daily x 7 days   07/03/2015 acute extended ov/Caetano Oberhaus re: f/u ? pna  Chief Complaint  Patient presents with  . Acute Visit    Pt states developed fever and chills 6 days ago. He feels very fatigued and achy. He has had some cough- non very prod. He has had some bloody nasal d/c.    had been doing a lot better until 6 days prior to OV  And starting to feel better in last 24 h on levaquin  rec If not improving go ahead and take the prednisone we called in  Work on inhaler technique:  relax and gently blow all the way out then take a nice smooth deep breath back in, triggering the inhaler at same time you start breathing in.  Hold for up to 5 seconds if you can. Blow out thru nose. Rinse and gargle with water when done Try prilosec otc 283m Take 30-60 min before first and last meals of the day  until cough is completely gone for at least a week without the need for cough suppression GERD diet   For cough take mucinex dm up to 1200 mg every 12 hours as needed    08/11/2015  f/u ov/Tinnie Kunin re: GOLD II / f/u pneumonia   Chief Complaint  Patient presents with  . Follow-up    Pt c/o cough at nighttime and some SOB with exertion. Pt is using a stationary bike half an hour per day with no SOB.    Overall quite a bit better and not really limited by breathing from daily activities on symb 160 2bid / rare need for saba  rec Add pepcid ac 20 mg at bedtime and try off prilosec   11/09/2015  f/u ov/Mclane Arora re: GOLD II maint rx symb 160 2bid / prn saba / no longer on any gerd rx  Chief Complaint  Patient presents with  . Follow-up     Had PFT today-had a cough last 3-4 days white sm. amt.,Using exercise bicycle.Sob same.Used Albuterol one time this wk.  doe = MMRC1 = can walk nl pace, flat grade, can't hurry or go uphills or steps s sob   rec Try prilosec otc 2089mTake 30-60 min before first meal  of the day and Pepcid ac (famotidine) 20 mg one @  bedtime until cough is completely gone for at least a week without the need for cough suppression Plan A = Automatic =  symbicort 160 Take 2 puffs first thing in am and then another 2 puffs about 12 hours later.  Plan B = Backup Only use your albuterol as a rescue medication   Flare cough x mid July 2017  rx zpak/ nasal spray only    02/29/2016  f/u ov/Ketzia Guzek re: GOLD II  Copd/ worse cough/ sob/ did not resume gerd rx/   on symbicort 160 2bid maint Chief Complaint  Patient presents with  . Follow-up    pt c/o worsening cough, PND, sob X4 wks.  cough is sometimes productive.  Pt went to pcp for this, given zpak.    cough coarse hs > white mucus only/ some saba but more no than twice weekly  No problem with ex tol despite cough Try prilosec otc 58m 1-2  Take 30-60 min before first meal of the day and Pepcid ac (famotidine) 20 mg one @  bedtime until cough is completely gone for at least a week without the need for cough suppression - if cough recurs restart immediately at 2 prilosec until you turn the corner  Depomedrol 120 mg IM  Plan A = Automatic =  symbicort 160 Take 2 puffs first thing in am and then another 2 puffs about 12 hours later.  Work on inhaler technique:  Plan B = Backup Only use your albuterol as a rescue medication      We will refill your valsartan x 3 months but after that you should let your primary doctor take over this    04/08/2016  f/u ov/Belvie Iribe re: copd exac on symbicort 160 2bid  Chief Complaint  Patient presents with  . Acute Visit    Pt. states he feels tightness in his chest, still coughing, Breathing has been worse, did not feel better on the  medication, some wheezing, Had to use his ventolin this am  after last ov felt a lot better for only a week or so then gradually worse sob/ cough/ increase need for saba    No obvious day to day or daytime variabilty or assoc excess/ purulent sputum or mucus plugs    cp or chest tightness, subjective wheeze overt sinus or hb symptoms. No unusual exp hx or h/o childhood pna/ asthma or knowledge of premature birth.  Sleeping ok without nocturnal  or early am exacerbation  of respiratory  c/o's or need for noct saba. Also denies any obvious fluctuation of symptoms with weather or environmental changes or other aggravating or alleviating factors except as outlined above   Current Medications, Allergies, Complete Past Medical History, Past Surgical History, Family History, and Social History were reviewed in [employer].  ROS  The following are not active complaints unless bolded sore throat, dysphagia, dental problems, itching, sneezing,  nasal congestion or excess/ purulent secretions, ear ache,   fever, chills, sweats, unintended wt loss, pleuritic or exertional cp, hemoptysis,  orthopnea pnd or leg swelling, presyncope, palpitations, heartburn, abdominal pain, anorexia, nausea, vomiting, diarrhea  or change in bowel or urinary habits, change in stools or urine, dysuria,hematuria,  rash, arthralgias, visual complaints, headache, numbness weakness or ataxia or problems with walking or coordination,  change in mood/affect or memory.               Objective:   Physical  Exam  amb wm nad   04/20/2015        212  >  07/03/2015    209 > 08/11/2015  210 > 11/10/2015 214 > 02/29/2016 215 > 04/08/2016  220     11/28/14 207 lb 6.4 oz (94.076 kg)  10/31/14 202 lb 9.6 oz (91.899 kg)  10/21/14 201 lb 1.9 oz (91.227 kg)    Vital signs reviewed  - sats 97% on RA on arrival    HEENT: nl dentition, turbinates, and orophanx. Nl external ear canals without cough reflex   NECK :   without JVD/Nodes/TM/ nl carotid upstrokes bilaterally   LUNGS: no acc muscle use,  Mid   exp rhonchi bilaterally s cough on insp/exp    CV:  RRR  no s3 or murmur or increase in P2, no edema   ABD:  soft and nontender with nl excursion in the supine position. No bruits or organomegaly, bowel sounds nl  MS:  warm without deformities, calf tenderness, cyanosis or clubbing  SKIN: warm and dry without lesions    NEURO:  alert, approp, no deficits     CXR PA and Lateral:   02/29/2016 :    I personally reviewed images and agree with radiology impression as follows:    No active disease. Stable hyperinflation and chronic mild interstitial prominence. Bilateral basilar scarring again noted.       Assessment:

## 2016-04-08 NOTE — Patient Instructions (Addendum)
Pantoprazole (protonix) 40 mg   Take  30-60 min before first meal of the day and Pepcid (famotidine)  20 mg one @  bedtime until return to office - this is the best way to tell whether stomach acid is contributing to your problem.    Depomedrol 120 mg IM   Be sure symbicort 160  Take 2 puffs first thing in am and then another 2 puffs about 12 hours later.   Work on inhaler technique:  relax and gently blow all the way out then take a nice smooth deep breath back in, triggering the inhaler at same time you start breathing in.  Hold for up to 5 seconds if you can. Blow out thru nose. Rinse and gargle with water when done    GERD (REFLUX)  is an extremely common cause of respiratory symptoms just like yours , many times with no obvious heartburn at all.    It can be treated with medication, but also with lifestyle changes including elevation of the head of your bed (ideally with 6 inch  bed blocks),  Smoking cessation, avoidance of late meals, excessive alcohol, and avoid fatty foods, chocolate, peppermint, colas, red wine, and acidic juices such as orange juice.  NO MINT OR MENTHOL PRODUCTS SO NO COUGH DROPS   USE SUGARLESS CANDY INSTEAD (Jolley ranchers or Stover's or Life Savers) or even ice chips will also do - the key is to swallow to prevent all throat clearing. NO OIL BASED VITAMINS - use powdered substitutes.    See Tammy NP w/in 2 weeks with all your medications, inhalers/ solutions/ even over the counter meds, separated in two separate bags, the ones you take no matter what vs the ones you stop once you feel better and take only as needed when you feel you need them.   Tammy  will generate for you a new user friendly medication calendar that will put [place] all on the same page re: your medication use.     Without this process, it simply isn't possible to assure that we are providing  your outpatient care  with  the attention to detail we feel you deserve.   If we cannot assure that you're  getting that kind of care,  then we cannot manage your problem effectively from this clinic.  Once you have seen Tammy and we are sure that we're all on the same page with your medication use she will arrange follow up with me.  Late add:  Consider adding spiriva respimat next ov if not doing better

## 2016-04-09 ENCOUNTER — Ambulatory Visit: Payer: PPO | Admitting: Internal Medicine

## 2016-04-10 ENCOUNTER — Encounter: Payer: Self-pay | Admitting: Internal Medicine

## 2016-04-10 ENCOUNTER — Ambulatory Visit (INDEPENDENT_AMBULATORY_CARE_PROVIDER_SITE_OTHER): Payer: PPO | Admitting: Internal Medicine

## 2016-04-10 ENCOUNTER — Other Ambulatory Visit: Payer: PPO

## 2016-04-10 VITALS — BP 142/80 | HR 96 | Ht 70.0 in | Wt 214.0 lb

## 2016-04-10 DIAGNOSIS — J449 Chronic obstructive pulmonary disease, unspecified: Secondary | ICD-10-CM

## 2016-04-10 MED ORDER — TIOTROPIUM BROMIDE MONOHYDRATE 1.25 MCG/ACT IN AERS: 2.0000 | INHALATION_SPRAY | Freq: Two times a day (BID) | RESPIRATORY_TRACT | 11 refills | Status: DC

## 2016-04-10 MED ORDER — TIOTROPIUM BROMIDE MONOHYDRATE 1.25 MCG/ACT IN AERS: 2.0000 | INHALATION_SPRAY | Freq: Every day | RESPIRATORY_TRACT | 0 refills | Status: DC

## 2016-04-10 MED ORDER — PREDNISONE 10 MG PO TABS: ORAL_TABLET | ORAL | 0 refills | Status: DC

## 2016-04-10 NOTE — Progress Notes (Signed)
Subjective:     Patient ID: [name], male   DOB: 28-Jan-1936    MRN: [id number]   Brief patient profile:  70 yowm quit smoking at dx of pna 03/2014 maintiained on advair since mid 2000s with confirmed GOLD II copd 11/30/13 referred to pulmonary clinic 09/13/2014  by Dr Darcus Austin for new dx of pna with flare of symptoms Aug 21 2014  Admit date: 09/07/2014 Discharge date: 09/09/2014   DISCHARGE DIAGNOSES:  Principal Problem:  CAP (community acquired pneumonia) Active Problems:  Chronic tension headaches  Diabetes mellitus type 2 with complications  Hyponatremia  Hyperlipidemia  COPD (chronic obstructive pulmonary disease)  Leukocytosis  Anemia      Filed Weights   09/07/14 1307  Weight: 88.905 kg (196 lb)    INITIAL HISTORY: 80 year old male of [place] descent with a past medical history of COPD, asthma and diabetes who presented with a 3 week history of worsening shortness of breath and cough. He was initially treated as an outpatient with steroid for bronchitis. With no improvement, he decided to come into the hospital. He was detected to have multilobar pneumonia in the ED. He was subsequently admitted to the hospital.    HOSPITAL COURSE:   Multilobar community-acquired pneumonia Patient was placed on intravenous antibiotics. He improved rather quickly. Cultures have been negative. He is feeling better. He is saturating normal on room air. Strep and legionella antigens are negative. Denies any nausea, vomiting. He was changed over to oral antibiotics. This morning, and then was discharged.          09/13/2014 1st [facility] office visit/ [facility]   Chief Complaint  Patient presents with  . Advice Only    referred by Dr. Darcus Austin, Pneumonia   onset was abrupt on Super bowl Sunday/ cough/congestion and increase need for albterol/ mucus mostly white, never high fever admitted Last levaquin Feb 29, feeling back to baseline = doe with more than slow  adls/ chronic dry daytime cough, neither  cough or sob better with saba/ still on advair 250 bid. >>d/c advair and ACE , change to symbicort and diovan       10/31/14 NP Folllow up PNA /COPD  Pt returns for a check up.  Seen last ov for a post hospital follow up .  Admitted for CAP with multilobar PNA.  tx w/ abx and steroids.  He is feeling much better. Gotten back to his exercise.  However started having some increased cough and congestion that is white over last 2-3 days.  Wanted to make sure he is not developing PNA again.  ESR last ov was slightly up at 32.  No chest pain, orthopnea, edema , hemoptysis , fever or discolored mucus.  rec Mucinex DM Twice daily  As needed  Cough and congestion .  Fluids and rest  Zpack to have on hold if symptoms worsen with discolored mucus .     11/28/2014 f/u ov/Wert re: GOLD II copd Dellis Anes 160 2bid  Chief Complaint  Patient presents with  . Follow-up    Pt states breathing is overall doing well. Still has some cough- prod with minimal white sputum.  He has only used albuterol 1-2 x in the past month.   Not limited by breathing from desired activities   Mostly cough in am/ rarely wakes him up  rec Work on inhaler technique:   Add pepcid 20 mg one at bedtime until am cough is gone  GERD  Diet    04/20/2015  f/u ov/Wert re: recurrent cough x one week on symbicort 160 2 bid though hfa 50%  Chief Complaint  Patient presents with  . Follow-up    COPD. Pt states his breathing is doing well. Pt states that he did use albuterol twice yesterday due to his symptoms. Pt c/o of SOB, productive cough with white mucus, phlegm in his throat. Pt denies any f/n/v/d, CP/tightness.    Onset was acute/ now needing saba with immediate relief, did not restart pepcid as rec at onset of cough  rec zpak > go ahead and take it Work on inhaler technique:  Try prilosec otc 68m  Take 30-60 min before first meal of the day and Pepcid ac (famotidine) 20 mg one @   bedtime until cough is completely gone for at least a week without the need for cough suppression GERD diet    06/30/15 cough/ low grade fever no better on zpak >  Be sure he's taking prilosec Take 30- 60 min before your first and last meals of the day > was not  Prednisone 10 mg take  4 each am x 2 days,   2 each am x 2 days,  1 each am x 2 days and stop  Levaquin 500 mg daily x 7 days   07/03/2015 acute extended ov/Wert re: f/u ? pna  Chief Complaint  Patient presents with  . Acute Visit    Pt states developed fever and chills 6 days ago. He feels very fatigued and achy. He has had some cough- non very prod. He has had some bloody nasal d/c.    had been doing a lot better until 6 days prior to OV  And starting to feel better in last 24 h on levaquin  rec If not improving go ahead and take the prednisone we called in  Work on inhaler technique:  relax and gently blow all the way out then take a nice smooth deep breath back in, triggering the inhaler at same time you start breathing in.  Hold for up to 5 seconds if you can. Blow out thru nose. Rinse and gargle with water when done Try prilosec otc 265m Take 30-60 min before first and last meals of the day  until cough is completely gone for at least a week without the need for cough suppression GERD diet   For cough take mucinex dm up to 1200 mg every 12 hours as needed    08/11/2015  f/u ov/Wert re: GOLD II / f/u pneumonia   Chief Complaint  Patient presents with  . Follow-up    Pt c/o cough at nighttime and some SOB with exertion. Pt is using a stationary bike half an hour per day with no SOB.    Overall quite a bit better and not really limited by breathing from daily activities on symb 160 2bid / rare need for saba  rec Add pepcid ac 20 mg at bedtime and try off prilosec   11/09/2015  f/u ov/Wert re: GOLD II maint rx symb 160 2bid / prn saba / no longer on any gerd rx  Chief Complaint  Patient presents with  . Follow-up     Had PFT today-had a cough last 3-4 days white sm. amt.,Using exercise bicycle.Sob same.Used Albuterol one time this wk.  doe = MMRC1 = can walk nl pace, flat grade, can't hurry or go uphills or steps s sob   rec Try prilosec otc 2063mTake 30-60 min before first meal  of the day and Pepcid ac (famotidine) 20 mg one @  bedtime until cough is completely gone for at least a week without the need for cough suppression Plan A = Automatic =  symbicort 160 Take 2 puffs first thing in am and then another 2 puffs about 12 hours later.  Plan B = Backup Only use your albuterol as a rescue medication   Flare cough x mid July 2017  rx zpak/ nasal spray only    02/29/2016  f/u ov/Wert re: GOLD II  Copd/ worse cough/ sob/ did not resume gerd rx/   on symbicort 160 2bid maint Chief Complaint  Patient presents with  . Follow-up    pt c/o worsening cough, PND, sob X4 wks.  cough is sometimes productive.  Pt went to pcp for this, given zpak.    cough coarse hs > white mucus only/ some saba but more no than twice weekly  No problem with ex tol despite cough Try prilosec otc 63m 1-2  Take 30-60 min before first meal of the day and Pepcid ac (famotidine) 20 mg one @  bedtime until cough is completely gone for at least a week without the need for cough suppression - if cough recurs restart immediately at 2 prilosec until you turn the corner  Depomedrol 120 mg IM  Plan A = Automatic =  symbicort 160 Take 2 puffs first thing in am and then another 2 puffs about 12 hours later.  Work on inhaler technique:  Plan B = Backup Only use your albuterol as a rescue medication      We will refill your valsartan x 3 months but after that you should let your primary doctor take over this    04/08/2016  f/u ov/Wert re: copd exac on symbicort 160 2bid  Chief Complaint  Patient presents with  . Acute Visit    Pt. states he feels tightness in his chest, still coughing, Breathing has been worse, did not feel better on the  medication, some wheezing, Had to use his ventolin this am  after last ov felt a lot better for only a week or so then gradually worse sob/ cough/ increase need for saba rec Pantoprazole (protonix) 40 mg   Take  30-60 min before first meal of the day and Pepcid (famotidine)  20 mg one @  bedtime until return to office - this is the best way to tell whether stomach acid is contributing to your problem.   Depomedrol 120 mg IM  Be sure symbicort 160  Take 2 puffs first thing in am and then another 2 puffs about 12 hours later.  Work on inhaler technique:  GERD diet    04/10/2016 acute extended ov/Wert re: still coughing with copd gold II Chief Complaint  Patient presents with  . Follow-up    Pt states his breathing since last OV. Pt c/o cough with little mucus production - white in color and the cough is wose at night. Pt denies f/c/s and CP/tightness.    mostly loses breath with severe coughing fits/ otherwise breathing about the same    No obvious day to day or daytime variabilty or assoc excess/ purulent sputum or mucus plugs    cp or chest tightness, subjective wheeze overt sinus or hb symptoms. No unusual exp hx or h/o childhood pna/ asthma or knowledge of premature birth.  Sleeping ok without nocturnal  or early am exacerbation  of respiratory  c/o's or need for noct saba. Also denies  any obvious fluctuation of symptoms with weather or environmental changes or other aggravating or alleviating factors except as outlined above   Current Medications, Allergies, Complete Past Medical History, Past Surgical History, Family History, and Social History were reviewed in [employer] record.  ROS  The following are not active complaints unless bolded sore throat, dysphagia, dental problems, itching, sneezing,  nasal congestion or excess/ purulent secretions, ear ache,   fever, chills, sweats, unintended wt loss, pleuritic or exertional cp, hemoptysis,  orthopnea pnd or leg  swelling, presyncope, palpitations, heartburn, abdominal pain, anorexia, nausea, vomiting, diarrhea  or change in bowel or urinary habits, change in stools or urine, dysuria,hematuria,  rash, arthralgias, visual complaints, headache, numbness weakness or ataxia or problems with walking or coordination,  change in mood/affect or memory.               Objective:   Physical Exam  amb wm nad   04/20/2015        212  >  07/03/2015    209 > 08/11/2015  210 > 11/10/2015 214 > 02/29/2016 215 > 04/08/2016  220 >  04/10/2016 214     11/28/14 207 lb 6.4 oz (94.076 kg)  10/31/14 202 lb 9.6 oz (91.899 kg)  10/21/14 201 lb 1.9 oz (91.227 kg)    Vital signs reviewed  - sats 96% on RA on arrival    HEENT: nl dentition, turbinates, and orophanx. Nl external ear canals without cough reflex   NECK :  without JVD/Nodes/TM/ nl carotid upstrokes bilaterally   LUNGS: no acc muscle use,  Mid exp rhonchi bilaterally s cough on insp/exp    CV:  RRR  no s3 or murmur or increase in P2, no edema   ABD:  soft and nontender with nl excursion in the supine position. No bruits or organomegaly, bowel sounds nl  MS:  warm without deformities, calf tenderness, cyanosis or clubbing  SKIN: warm and dry without lesions    NEURO:  alert, approp, no deficits            Assessment:

## 2016-04-10 NOTE — Assessment & Plan Note (Signed)
Complicated by diabetes hypertension and hyperlipidemia  Body mass index is 31.62 trending up  Lab Results  Component Value Date   TSH 1.303 09/07/2014     Contributing to gerd tendency/ doe/reviewed the need and the process to achieve and maintain neg calorie balance > defer f/u primary care including intermittently monitoring thyroid status

## 2016-04-10 NOTE — Patient Instructions (Signed)
Work on inhaler technique:  relax and gently blow all the way out then take a nice smooth deep breath back in, triggering the inhaler at same time you start breathing in.  Hold for up to 5 seconds if you can. Blow out thru nose. Rinse and gargle with water when done    Prednisone 10 mg take  4 each am x 2 days,   2 each am x 2 days,  1 each am x 2 days and stop   Plan A = Automatic = Symbicort 160 Take 2 puffs first thing in am and then another 2 puffs about 12 hours later.                                      Spiriva 1.25 Take 2 puffs first thing in am and then another 2 puffs about 12 hours later.      Plan B = Backup Only use your albuterol as a rescue medication to be used if you can't catch your breath by resting or doing a relaxed purse lip breathing pattern.  - The less you use it, the better it will work when you need it. - Ok to use the inhaler up to 2 puffs  every 4 hours if you must but call for appointment if use goes up over your usual need - Don't leave home without it !!  (think of it like the spare tire for your car)   Plan C = Crisis - only use your albuterol nebulizer if you first try Plan B and it fails to help > ok to use the nebulizer up to every 4 hours but if start needing it regularly call for immediate appointment   Plan D = Doctor - call me if B and C not adequate  Plan E = ER - go to ER or call 911 if all else fails     For cough > mucinex dm 1200 mg every 12 hours as needed

## 2016-04-10 NOTE — Assessment & Plan Note (Addendum)
11/30/13  FEV1  1.76(57%) ratio 58 and no better p saba, dlco 78% - quit smoking sept 2015  - 09/13/2014 d/c'd  acei   - PFT's  11/09/2015  FEV1 1.86 (62 % ) ratio 61  p 7 % improvement from saba p symbicort 160 prior to study with DLCO  66/66c % corrects to 71 % for alv volume - FENO 04/08/2016  =   22 on symb 160 2bid  04/08/2016  After extensive coaching HFA effectiveness =    90%     Symptoms are markedly disproportionate to objective findings and not clear this is all lung problem but pt does appear to have difficult airway management issues.  DDX of  difficult airways management almost all start with A and  include Adherence, Ace Inhibitors, Acid Reflux, Active Sinus Disease, Alpha 1 Antitripsin deficiency, Anxiety masquerading as Airways dz,  ABPA,  Allergy(esp in young), Aspiration (esp in elderly), Adverse effects of meds,  Active smokers, A bunch of PE's (a small clot burden can't cause this syndrome unless there is already severe underlying pulm or vascular dz with poor reserve) plus two Bs  = Bronchiectasis and Beta blocker use..and one C= CHF   Adherence is always the initial "prime suspect" and is a multilayered concern that requires a "trust but verify" approach in every patient - starting with knowing how to use medications, especially inhalers, correctly, keeping up with refills and understanding the fundamental difference between maintenance and prns vs those medications only taken for a very short course and then stopped and not refilled.  - needs better method of med reconciliation.  To keep things simple, I have asked the patient to first separate medicines that are perceived as maintenance, that is to be taken daily "no matter what", from those medicines that are taken on only on an as-needed basis and I have given the patient examples of both, and then return to see our NP to generate a  detailed  medication calendar which should be followed until the next physician sees the patient and  updates it.    ? Acid (or non-acid) GERD > always difficult to exclude as up to 75% of pts in some series report no assoc GI/ Heartburn symptoms> rec max (24h)  acid suppression and diet restrictions/ reviewed and instructions given in writing.   ? Allergy component > doubt since FENO so low:   Depomerol 120 IM only, no further steroids - in fact may benefit from trial of spiriva   ? Anxiety > usually at the bottom of this list of usual suspects     I had an extended discussion with the patient reviewing all relevant studies completed to date and  lasting 15 to 20 minutes of a 25 minute visit    Each maintenance medication was reviewed in detail including most importantly the difference between maintenance and prns and under what circumstances the prns are to be triggered using an action plan format that is not reflected in the computer generated alphabetically organized AVS.    Please see instructions for details which were reviewed in writing and the patient given a copy highlighting the part that I personally wrote and discussed at today's ov.

## 2016-04-11 NOTE — Assessment & Plan Note (Addendum)
11/30/13  FEV1  1.76(57%) ratio 58 and no better p saba, dlco 78% - quit smoking sept 2015  - 09/13/2014 d/c'd  acei   - PFT's  11/09/2015  FEV1 1.86 (62 % ) ratio 61  p 7 % improvement from saba p symbicort 160 prior to study with DLCO  66/66c % corrects to 71 % for alv volume - 02/29/2016  After extensive coaching HFA effectiveness =    90%    - FENO 04/08/2016  =   22 on symb 160 2bid   - 04/10/2016  After extensive coaching device  effectiveness =    90% with respimat:   >>>  Clearly still having a copd flare s evidence of purulent infection or active eos inflammation so reasonable to add trial of spiriva respimat at this point  Continue to be concerned about concept of medication reconciliation so set up for f/u with NP asap with all meds in hand   Each maintenance medication was reviewed in detail including most importantly the difference between maintenance and as needed and under what circumstances the prns are to be used.  Please see instructions for details which were reviewed in writing and the patient given a copy.

## 2016-04-16 ENCOUNTER — Encounter: Payer: Self-pay | Admitting: Family

## 2016-04-16 ENCOUNTER — Other Ambulatory Visit: Payer: PPO

## 2016-04-16 DIAGNOSIS — L249 Irritant contact dermatitis, unspecified cause: Secondary | ICD-10-CM | POA: Diagnosis not present

## 2016-04-16 DIAGNOSIS — Z85828 Personal history of other malignant neoplasm of skin: Secondary | ICD-10-CM | POA: Diagnosis not present

## 2016-04-17 ENCOUNTER — Telehealth: Payer: Self-pay | Admitting: Pulmonary Disease

## 2016-04-17 ENCOUNTER — Encounter: Payer: Self-pay | Admitting: Pulmonary Disease

## 2016-04-17 ENCOUNTER — Ambulatory Visit (INDEPENDENT_AMBULATORY_CARE_PROVIDER_SITE_OTHER)
Admission: RE | Admit: 2016-04-17 | Discharge: 2016-04-17 | Disposition: A | Discharge status: Home / Self Care | Payer: PPO | Source: Ambulatory Visit | Attending: Pulmonary Disease | Admitting: Pulmonary Disease

## 2016-04-17 ENCOUNTER — Ambulatory Visit (INDEPENDENT_AMBULATORY_CARE_PROVIDER_SITE_OTHER): Payer: PPO | Admitting: Pulmonary Disease

## 2016-04-17 VITALS — BP 142/74 | HR 95 | Ht 70.0 in | Wt 213.0 lb

## 2016-04-17 DIAGNOSIS — R059 Cough, unspecified: Secondary | ICD-10-CM

## 2016-04-17 DIAGNOSIS — R05 Cough: Secondary | ICD-10-CM

## 2016-04-17 DIAGNOSIS — J449 Chronic obstructive pulmonary disease, unspecified: Secondary | ICD-10-CM | POA: Diagnosis not present

## 2016-04-17 DIAGNOSIS — R918 Other nonspecific abnormal finding of lung field: Secondary | ICD-10-CM | POA: Diagnosis not present

## 2016-04-17 NOTE — Progress Notes (Signed)
Subjective:    Patient ID: [name], male    DOB: 11-13-1935, 80 y.o.   MRN: [id number]  [facility].:  Acute visit for Cough with known Moderate-Severe COPD.  HPI Cough:  Patient was seen on 9/25 & 9/28 with reported cough.  He reports he is having trouble expectorating his mucus. He was coughing more last night as well. He does feel like his cough improved more with the IM steroid injection rather than the Prednisone.   Moderate-Severe COPD:  He reports he has been exercising nightly. He feels like he became more dyspneic last night after exercising and dinner particularly when he was laying down to go to bed at 11:30pm. He reports his dyspnea improved after using his rescue Albuterol. He wasn't using/needing his rescue inhaler before yesterday evening. He is compliant with his Symbicort & Spiriva. He denies any nocturnal awakening with breathing difficulty.   Review of Systems Denies any nausea, emesis, or reflux last night. No morning brash water taste. Denies any chest pain, tightness, or pressure. No syncope or near syncope. No diaphoresis.   Allergies  Allergen Reactions  . Pneumovax 23 [Pneumococcal Vac Polyvalent]     Fever, sweats  . Prednisone Other (See Comments)    Sweating with high dose  . Prevnar 13 [Pneumococcal 13-Val Conj Vacc] Swelling  . Penicillins Rash  . Sulfa Antibiotics Rash    Current Outpatient Prescriptions on File Prior to Visit  Medication Sig Dispense Refill  . albuterol (PROVENTIL HFA;VENTOLIN HFA) 108 (90 BASE) MCG/ACT inhaler Inhale 2 puffs into the lungs every 6 (six) hours as needed for wheezing or shortness of breath.    . Ascorbic Acid (VITAMIN C PO) Take 1 tablet by mouth daily.    Marland Kitchen aspirin 81 MG tablet Take 81 mg by mouth daily.    Marland Kitchen atorvastatin (LIPITOR) 10 MG tablet Take 10 mg by mouth daily.    . famotidine (PEPCID) 20 MG tablet One at bedtime    . finasteride (PROSCAR) 5 MG tablet Take 5 mg by mouth daily.    Marland Kitchen glipiZIDE (GLUCOTROL XL) 5  MG 24 hr tablet Take 5 mg by mouth daily with breakfast. x30 days per PCP    . metFORMIN (GLUCOPHAGE) 500 MG tablet Take 1 tablet (500 mg total) by mouth 2 (two) times daily with a meal. Resume tonight.    . Multiple Vitamin (MULTIVITAMIN WITH MINERALS) TABS Take 1 tablet by mouth daily.    . ONE TOUCH ULTRA TEST test strip     . ONETOUCH DELICA LANCETS 99991111 MISC     . pantoprazole (PROTONIX) 40 MG tablet Take 1 tablet (40 mg total) by mouth daily. Take 30-60 min before first meal of the day 30 tablet 2  . sildenafil (VIAGRA) 50 MG tablet Take 50 mg by mouth daily as needed for erectile dysfunction.    . SYMBICORT 160-4.5 MCG/ACT inhaler INHALE 2 PUFFS INTO THE LUNGS 2 TIMES DAILY 1 Inhaler 11  . Tamsulosin HCl (FLOMAX) 0.4 MG CAPS Take 0.4 mg by mouth daily.    . Tiotropium Bromide Monohydrate (SPIRIVA RESPIMAT) 1.25 MCG/ACT AERS Inhale 2 puffs into the lungs daily. 1 Inhaler 0  . Tiotropium Bromide Monohydrate (SPIRIVA RESPIMAT) 1.25 MCG/ACT AERS Inhale 2 puffs into the lungs 2 (two) times daily. 1 Inhaler 11  . valsartan (DIOVAN) 160 MG tablet Take 1 tablet (160 mg total) by mouth daily. 90 tablet 0   No current facility-administered medications on file prior to visit.  Past Medical History:  Diagnosis Date  . Asthma   . Bronchitis   . COPD (chronic obstructive pulmonary disease) ([place])   . Degenerative cervical disc   . Diabetes mellitus   . Hematoma    left subdoral  . Hypercholesterolemia   . Hypertension   . Iliac artery aneurysm ([place])   . Lung nodule   . Macular degeneration   . Peripheral neuropathy ([place])   . Progressive gait disorder   . 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 Skull fracture ([place])   . Subdural hematoma ([facility])     No past surgical history on file.  Family History  Problem Relation Age of Onset  . Stroke Father     Social History   Social History  . Marital status: Married    Spouse name: [name]  . Number of children: 3  . Years of education: 55     Occupational History  . Retired    Social History Main Topics  . Smoking status: Former Smoker    Packs/day: 0.50    Years: 60.00    Types: Cigarettes    Quit date: 04/07/2014  . Smokeless tobacco: Never Used  . Alcohol use 7.0 oz/week    14 Standard drinks or equivalent per week     Comment: 1-2 before dinner  . Drug use: No  . Sexual activity: Not Asked   Other Topics Concern  . None   Social History Narrative   Patient is married [name]).   Patient has three children and 7 grandchildren.   Patient is retired.   Patient drinks one serving of coffee daily.   Patient is right-handed.   Patient has a college education.      Objective:   Physical Exam BP (!) 142/74 (BP Location: Left Arm, Cuff Size: Normal)   Pulse 95   Ht 5\' 10"  (1.778 m)   Wt 213 lb (96.6 kg)   SpO2 98%   BMI 30.56 kg/m  General:  Awake. Alert. Nodistress. Mild central obesity. Integument:  Warm & dry. No rash on exposed skin. No bruising. HEENT:  Moist mucus membranes. No oral ulcers. No scleral injection or icterus. Minimal nasal turbinate swelling.. Cardiovascular:  Regular rate. No edema. Normal S1 & S2.  Pulmonary:  Good aeration & clear to auscultation bilaterally. Symmetric chest wall expansion. No accessory muscle use on room air. Abdomen: Soft. Normal bowel sounds. Protuberant. Grossly nontender. Musculoskeletal:  Normal bulk and tone. No joint deformity or effusion appreciated.  PFT 11/09/15: FVC 2.94 L (71%) FEV1 1.73 L (58%) FEV1/FVC 0.59 FEF 25-75 0.74 L (35%) negative bronchodilator response TLC 8.17 L (123%) RV 207% ERV 49% DLC corrected 66% (Hgb 14.4)  IMAGING CXR PA/LAT 02/29/16 (personally reviewed by me): New medial right basilar opacity. No focal opacity otherwise. Increased interstitial markings in the lower lung zones. No pleural effusion or thickening appreciated. Heart normal in size & mediastinum normal in contour.  CT CHEST W/ CONTRAST 09/07/14 (personally reviewed by  me): Dense left lower lobe consolidation with air bronchograms. Dependent edema right lower lobe patchy opacification and consolidation with air bronchograms. Some opacification with consolidation and air bronchograms in the inferior lingula. No significant pleural effusion appreciated. No pathologic mediastinal adenopathy. No pericardial effusion.    Assessment & Plan:  80 y.o. Hispanic male  known history of moderate-severe COPD. Patient's cough occurring when he was recumbent last night after eating and exercising is certainly concerning for possible silent laryngo-esophageal reflux. Physical exam today shows no wheezing or  signs of acute decompensation from his underlying COPD. He has no symptoms that would suggest reflux otherwise. He is avoiding eating within 2 hours of bedtime but does not have his bed elevated. Patient's spirometry shows moderate-severe COPD but does not show any significant bronchodilator response that would suggest further bronchodilator therapy would be of great benefit to the patient. I instructed the patient contacted our office if he had any further questions or concerns before his next appointment.  1. Cough:  Suspect possible silent laryngo-esophageal reflux. Continuing Protonix & Pepcid. Checking chest x-ray PA/LAT today & esophagogram. Recommended elevating the head of bed 3-5 inches daily. 2. Moderate-Severe COPD: Continuing Symbicort & Spiriva as prescribed. No changes. 3. Lung Opacity: Repeat chest x-ray PA/LAT today. 4. Follow-up:  Keeping appointment with next week.  Sonia Baller Ashok Cordia, M.D. [facility] Pulmonary [facility] Critical Care Pager:  [phone] After 3pm or if no response, call [phone] 2:27 PM 04/17/16

## 2016-04-17 NOTE — Telephone Encounter (Signed)
PFT 11/09/15: FVC 2.94 L (71%) FEV1 1.73 L (58%) FEV1/FVC 0.59 FEF 25-75 0.74 L (35%) negative bronchodilator response TLC 8.17 L (123%) RV 207% ERV 49% DLC corrected 66% (Hgb 14.4)  IMAGING CXR PA/LAT 02/29/16 (personally reviewed by me): New medial right basilar opacity. No focal opacity otherwise. Increased interstitial markings in the lower lung zones. No pleural effusion or thickening appreciated. Heart: Size & mediastinum normal in contour.  CT CHEST W/ CONTRAST 09/07/14 (personally reviewed by me): Dense left lower lobe consolidation with air bronchograms. Dependent edema right lower lobe patchy opacification and consolidation with air bronchograms. Some opacification with consolidation and air bronchograms in the inferior lingula. No significant pleural effusion appreciated. No pathologic mediastinal adenopathy. No pericardial effusion.

## 2016-04-17 NOTE — Patient Instructions (Addendum)
   Continue taking your medications as prescribed.   Elevate the head of your bed 3-5 inches by placing either 2 2x4 boards under the front bed posts or 2 bricks under the posts.  We will keep your appointment with Tammy Parrett next week.  TESTS ORDERED: 1. CXR PA/LAT Today 2. Barium Swallow/Esophagram

## 2016-04-19 ENCOUNTER — Ambulatory Visit (INDEPENDENT_AMBULATORY_CARE_PROVIDER_SITE_OTHER): Payer: PPO | Admitting: Family

## 2016-04-19 ENCOUNTER — Encounter: Payer: Self-pay | Admitting: Family

## 2016-04-19 ENCOUNTER — Ambulatory Visit
Admission: RE | Admit: 2016-04-19 | Discharge: 2016-04-19 | Disposition: A | Discharge status: Home / Self Care | Payer: PPO | Source: Ambulatory Visit | Attending: Family | Admitting: Family

## 2016-04-19 VITALS — BP 123/80 | HR 86 | Temp 98.4°F | Resp 18 | Ht 70.0 in | Wt 213.6 lb

## 2016-04-19 DIAGNOSIS — I728 Aneurysm of other specified arteries: Secondary | ICD-10-CM | POA: Diagnosis not present

## 2016-04-19 DIAGNOSIS — I723 Aneurysm of iliac artery: Secondary | ICD-10-CM

## 2016-04-19 MED ORDER — IOPAMIDOL (ISOVUE-370) INJECTION 76%
75.0000 mL | Freq: Once | INTRAVENOUS | Status: AC | PRN
  Administered 2016-04-19: 75 mL via INTRAVENOUS

## 2016-04-19 NOTE — Progress Notes (Signed)
[facility] [place]   CC: Follow up common iliac artery Aneurysm  History of Present Illness  [name] is a 80 y.o. (January 06, 1936) male patient of Dr. 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 He has had no abdominal or back symptoms. This was found incidentally during evaluation of hematuria. He does have some right hip problems; he was evaluated by an orthopod, pt sates his hip is fine, states he was told that he has some arthritis in his lower back causing the mild intermittent right hip pain.  Dr. Kellie Simmering last evaluated pt on 03/29/14. At that time CT angiogram of the abdomen and pelvis demonstrated right common iliac artery aneurysm as unchanged at 2.6 cm. The area of slight dilatation in the celiac axis was 14 mm and unchanged.  He returns for discussion of results of CTA abd/pelvis done this morning for evaluation of right CIA and celiac artery aneurysms. The patient denies claudication in legs with walking. The patient denies history of stroke or TIA symptoms.  Pt Diabetic: Yes, states his last A1C was 6.6 Pt smoker: former smoker, quit in September 2015, smoked x 60 years   Past Medical History:  Diagnosis Date  . Asthma   . Bronchitis   . COPD (chronic obstructive pulmonary disease) ([place])   . Degenerative cervical disc   . Diabetes mellitus   . Hematoma    left subdoral  . Hypercholesterolemia   . Hypertension   . Iliac artery aneurysm ([place])   . Lung nodule   . Macular degeneration   . Peripheral neuropathy ([place])   . Progressive gait disorder   . 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 Skull fracture ([place])   . Subdural hematoma ([facility])    History reviewed. No pertinent surgical history. Social History Social History   Social History  . Marital status: Married    Spouse name: [name]  . Number of children: 3  . Years of education: 40    Occupational History  . Retired    Social History Main Topics  . Smoking status: Former Smoker    Packs/day: 0.50    Years: 60.00    Types: Cigarettes    Quit date: 04/07/2014  . Smokeless tobacco: Never Used  . Alcohol use 7.0 oz/week    14 Standard drinks or equivalent per week     Comment: 1-2 before dinner  . Drug use: No  . Sexual activity: Not on file   Other Topics Concern  . Not on file   Social History Narrative   Patient is married [name]).   Patient has three children and 7 grandchildren.   Patient is retired.   Patient drinks one serving of coffee daily.   Patient is right-handed.   Patient has a college education.   Family History Family History  Problem Relation Age of Onset  . Stroke Father     Current Outpatient Prescriptions on File Prior to Visit  Medication Sig Dispense Refill  . albuterol (PROVENTIL HFA;VENTOLIN HFA) 108 (90 BASE) MCG/ACT inhaler Inhale 2 puffs into the lungs every 6 (six) hours as needed for wheezing or shortness of breath.    . Ascorbic Acid (VITAMIN C PO) Take 1 tablet by mouth daily.    Marland Kitchen aspirin 81 MG tablet Take 81 mg by mouth daily.    Marland Kitchen atorvastatin (LIPITOR) 10 MG tablet Take 10 mg by mouth daily.    Marland Kitchen  famotidine (PEPCID) 20 MG tablet One at bedtime    . finasteride (PROSCAR) 5 MG tablet Take 5 mg by mouth daily.    Marland Kitchen glipiZIDE (GLUCOTROL XL) 5 MG 24 hr tablet Take 5 mg by mouth daily with breakfast. x30 days per PCP    . metFORMIN (GLUCOPHAGE) 500 MG tablet Take 1 tablet (500 mg total) by mouth 2 (two) times daily with a meal. Resume tonight.    . Multiple Vitamin (MULTIVITAMIN WITH MINERALS) TABS Take 1 tablet by mouth daily.    . ONE TOUCH ULTRA TEST test strip     . ONETOUCH DELICA LANCETS 99991111 MISC     . pantoprazole (PROTONIX) 40 MG tablet Take 1 tablet (40 mg total) by mouth daily. Take 30-60 min before first meal of the day 30 tablet 2  . sildenafil (VIAGRA) 50 MG tablet Take 50 mg by mouth daily as needed for  erectile dysfunction.    . SYMBICORT 160-4.5 MCG/ACT inhaler INHALE 2 PUFFS INTO THE LUNGS 2 TIMES DAILY 1 Inhaler 11  . Tamsulosin HCl (FLOMAX) 0.4 MG CAPS Take 0.4 mg by mouth daily.    . Tiotropium Bromide Monohydrate (SPIRIVA RESPIMAT) 1.25 MCG/ACT AERS Inhale 2 puffs into the lungs daily. 1 Inhaler 0  . valsartan (DIOVAN) 160 MG tablet Take 1 tablet (160 mg total) by mouth daily. 90 tablet 0  . Tiotropium Bromide Monohydrate (SPIRIVA RESPIMAT) 1.25 MCG/ACT AERS Inhale 2 puffs into the lungs 2 (two) times daily. (Patient not taking: Reported on 04/19/2016) 1 Inhaler 11   No current facility-administered medications on file prior to visit.    Allergies  Allergen Reactions  . Pneumovax 23 [Pneumococcal Vac Polyvalent]     Fever, sweats  . Prednisone Other (See Comments)    Sweating with high dose  . Prevnar 13 [Pneumococcal 13-Val Conj Vacc] Swelling  . Penicillins Rash  . Sulfa Antibiotics Rash    ROS: See HPI for pertinent positives and negatives.  Physical Examination  Vitals:   04/19/16 1225  BP: 123/80  Pulse: 86  Resp: 18  Temp: 98.4 F (36.9 C)  TempSrc: Oral  SpO2: 96%  Weight: 213 lb 9.6 oz (96.9 kg)  Height: 5\' 10"  (1.778 m)   Body mass index is 30.65 kg/m.  General: A&O x 3, WD, obese male accompanied by his wife.  Pulmonary: Sym exp, respirations are non labored.  Cardiac: Regular rthythm and rate.    Carotid Bruits Right Left   Negative Negative   Aorta is not palpable Radial pulses are 2+ palpable                          VASCULAR EXAM:  LE Pulses Right Left       FEMORAL  not palpable (obese)  not palpable (obese)       POPLITEAL  not palpable  2+ palpable       POSTERIOR TIBIAL  2+ palpable  2+ palpable       DORSALIS PEDIS      ANTERIOR TIBIAL 2+ palpable 2+ palpable     Gastrointestinal: soft,  NTND, -G/R, - HSM, - masses palpated, - CVAT B.  Musculoskeletal: M/S 5/5 throughout, Extremities without ischemic changes.  Neurologic: CN 2-12 intact, Pain and light touch intact in extremities are intact, Motor exam as listed above.    Medical Decision Making  The patient is a 80 y.o. 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 His blood pressure is in good control and he has not used tobacco since September of 2015.  He had a CTA abd/pelvis this morning. Dr. Donzetta Matters viewed these images and measured the right CIA at 2.7 cm, and the celiac artery at 15 mm.  This is no significant change from, the CTA abd/pelvis on 03/29/14.    Based on this patient's exam and diagnostic studies, the patient will follow up in 2 years with the following studies: CTA abd/pelvis for evaluation of right CIA and celiac artery aneurysms, see me afterward for discussion of results on a day that Dr. Donzetta Matters is in the office. Consideration for repair of common iliac artery aneurysm would be at 3.0 cm or greater.    I emphasized the importance of maximal medical management including strict control of blood pressure, blood glucose, and lipid levels, antiplatelet agents, obtaining regular exercise, and continued cessation of smoking.   The patient was advised to call [phone] should the patient experience sudden onset abdominal or back pain.   Thank you for allowing [place] to participate in this patient's care.  Clemon Chambers, RN, MSN, FNP-C [facility] [place] Office: [phone]  Clinic Physician: Bridgett Larsson  04/19/2016, 12:38 PM

## 2016-04-23 ENCOUNTER — Ambulatory Visit (HOSPITAL_COMMUNITY)
Admission: RE | Admit: 2016-04-23 | Discharge: 2016-04-23 | Disposition: A | Discharge status: Home / Self Care | Payer: PPO | Source: Ambulatory Visit | Attending: Pulmonary Disease | Admitting: Pulmonary Disease

## 2016-04-23 DIAGNOSIS — R05 Cough: Secondary | ICD-10-CM | POA: Insufficient documentation

## 2016-04-23 DIAGNOSIS — K219 Gastro-esophageal reflux disease without esophagitis: Secondary | ICD-10-CM | POA: Insufficient documentation

## 2016-04-23 DIAGNOSIS — R059 Cough, unspecified: Secondary | ICD-10-CM

## 2016-04-23 DIAGNOSIS — Z23 Encounter for immunization: Secondary | ICD-10-CM | POA: Diagnosis not present

## 2016-04-25 ENCOUNTER — Telehealth: Payer: Self-pay | Admitting: Pulmonary Disease

## 2016-04-25 ENCOUNTER — Encounter: Payer: PPO | Admitting: Adult Health

## 2016-04-25 ENCOUNTER — Other Ambulatory Visit: Payer: Self-pay | Admitting: Internal Medicine

## 2016-04-25 DIAGNOSIS — R058 Other specified cough: Secondary | ICD-10-CM

## 2016-04-25 DIAGNOSIS — R05 Cough: Secondary | ICD-10-CM

## 2016-04-25 DIAGNOSIS — K219 Gastro-esophageal reflux disease without esophagitis: Secondary | ICD-10-CM

## 2016-04-25 NOTE — Telephone Encounter (Signed)
Attempted to contact patient, no answer, will call back. 

## 2016-04-25 NOTE — Telephone Encounter (Signed)
Spoke with pt, aware of results/recs.  Referral to GI placed by MW.  Nothing further needed.

## 2016-04-26 NOTE — Telephone Encounter (Signed)
If he has seen Dr. Cristina Gong then all he need do is make an appointment. If he hasn't seen Dr. Cristina Gong then by all means please place a referral under my name. Not sure that I'm the one that placed the referral order though. Thanks.

## 2016-04-26 NOTE — Telephone Encounter (Signed)
Patient states that Dr. Ashok Cordia put in a referral for him to see a GI doctor here in this building.  He would like to request seeing his own GI, Dr. Cristina Gong and would like a referral sent to Dr. Cristina Gong instead.  Dr. Ashok Cordia, please advise if this is okay.

## 2016-04-26 NOTE — Telephone Encounter (Signed)
LMOM TCB x2

## 2016-04-26 NOTE — Telephone Encounter (Signed)
Patient returning call. He can be reached at [phone]

## 2016-04-26 NOTE — Telephone Encounter (Signed)
Spoke with patient, states he's established with Dr. Cristina Gong but hasn't seen him in quite some time, and requesting a referral be placed.  This has been sent.  Nothing further needed.

## 2016-04-29 DIAGNOSIS — K219 Gastro-esophageal reflux disease without esophagitis: Secondary | ICD-10-CM | POA: Diagnosis not present

## 2016-04-29 DIAGNOSIS — R05 Cough: Secondary | ICD-10-CM | POA: Diagnosis not present

## 2016-05-09 DIAGNOSIS — F17211 Nicotine dependence, cigarettes, in remission: Secondary | ICD-10-CM | POA: Diagnosis not present

## 2016-05-09 DIAGNOSIS — H903 Sensorineural hearing loss, bilateral: Secondary | ICD-10-CM | POA: Diagnosis not present

## 2016-05-09 DIAGNOSIS — H6123 Impacted cerumen, bilateral: Secondary | ICD-10-CM | POA: Diagnosis not present

## 2016-05-16 ENCOUNTER — Ambulatory Visit: Payer: PPO | Admitting: Podiatry

## 2016-05-16 ENCOUNTER — Ambulatory Visit: Payer: PPO | Admitting: Internal Medicine

## 2016-05-16 DIAGNOSIS — H01004 Unspecified blepharitis left upper eyelid: Secondary | ICD-10-CM | POA: Diagnosis not present

## 2016-05-16 DIAGNOSIS — H01001 Unspecified blepharitis right upper eyelid: Secondary | ICD-10-CM | POA: Diagnosis not present

## 2016-05-22 DIAGNOSIS — D2372 Other benign neoplasm of skin of left lower limb, including hip: Secondary | ICD-10-CM | POA: Diagnosis not present

## 2016-05-22 DIAGNOSIS — Z85828 Personal history of other malignant neoplasm of skin: Secondary | ICD-10-CM | POA: Diagnosis not present

## 2016-05-22 DIAGNOSIS — L918 Other hypertrophic disorders of the skin: Secondary | ICD-10-CM | POA: Diagnosis not present

## 2016-05-22 DIAGNOSIS — L821 Other seborrheic keratosis: Secondary | ICD-10-CM | POA: Diagnosis not present

## 2016-05-22 DIAGNOSIS — L570 Actinic keratosis: Secondary | ICD-10-CM | POA: Diagnosis not present

## 2016-05-22 DIAGNOSIS — D692 Other nonthrombocytopenic purpura: Secondary | ICD-10-CM | POA: Diagnosis not present

## 2016-05-30 ENCOUNTER — Encounter: Payer: Self-pay | Admitting: Podiatry

## 2016-05-30 ENCOUNTER — Ambulatory Visit (INDEPENDENT_AMBULATORY_CARE_PROVIDER_SITE_OTHER): Payer: PPO | Admitting: Podiatry

## 2016-05-30 VITALS — Ht 70.0 in | Wt 213.0 lb

## 2016-05-30 DIAGNOSIS — M79676 Pain in unspecified toe(s): Secondary | ICD-10-CM | POA: Diagnosis not present

## 2016-05-30 DIAGNOSIS — B351 Tinea unguium: Secondary | ICD-10-CM | POA: Diagnosis not present

## 2016-05-30 DIAGNOSIS — E1140 Type 2 diabetes mellitus with diabetic neuropathy, unspecified: Secondary | ICD-10-CM

## 2016-05-30 NOTE — Progress Notes (Signed)
Patient ID: [name], male   DOB: 09/12/1935, 80 y.o.   MRN: [id number] Complaint:  Visit Type: Patient returns to my office for continued preventative foot care services. Complaint: Patient states" my nails have grown long and thick and become painful to walk and wear shoes" Patient has been diagnosed with DM with neuropathy. He presents for preventative foot care services. No changes to ROS  Podiatric Exam: Vascular: dorsalis pedis and posterior tibial pulses are palpable bilateral. Capillary return is immediate. Temperature gradient is WNL. Skin turgor WNL  Sensorium: Diminishedl Semmes Weinstein monofilament test. Normal tactile sensation bilaterally. Nail Exam: Pt has thick disfigured discolored nails with subungual debris noted bilateral entire nail hallux through fifth toenails Ulcer Exam: There is no evidence of ulcer or pre-ulcerative changes or infection. Orthopedic Exam: Muscle tone and strength are WNL. No limitations in general ROM. No crepitus or effusions noted. Foot type and digits show no abnormalities. Bony prominences are unremarkable.Hammer toes 2-5 B/L Skin: No Porokeratosis. No infection or ulcers  Diagnosis:  Tinea unguium, Pain in right toe, pain in left toes  Treatment & Plan Procedures and Treatment: Consent by patient was obtained for treatment procedures. The patient understood the discussion of treatment and procedures well. All questions were answered thoroughly reviewed. Debridement of mycotic and hypertrophic toenails, 1 through 5 bilateral and clearing of subungual debris. No ulceration, no infection noted.  Return Visit-Office Procedure: Patient instructed to return to the office for a follow up visit 3 months for continued evaluation and treatment.  Nicolaus Andel DPM 

## 2016-06-04 DIAGNOSIS — H04123 Dry eye syndrome of bilateral lacrimal glands: Secondary | ICD-10-CM | POA: Diagnosis not present

## 2016-06-20 DIAGNOSIS — K219 Gastro-esophageal reflux disease without esophagitis: Secondary | ICD-10-CM | POA: Diagnosis not present

## 2016-06-20 DIAGNOSIS — R05 Cough: Secondary | ICD-10-CM | POA: Diagnosis not present

## 2016-06-27 DIAGNOSIS — I723 Aneurysm of iliac artery: Secondary | ICD-10-CM | POA: Diagnosis not present

## 2016-06-27 DIAGNOSIS — Z7984 Long term (current) use of oral hypoglycemic drugs: Secondary | ICD-10-CM | POA: Diagnosis not present

## 2016-06-27 DIAGNOSIS — E785 Hyperlipidemia, unspecified: Secondary | ICD-10-CM | POA: Diagnosis not present

## 2016-06-27 DIAGNOSIS — I10 Essential (primary) hypertension: Secondary | ICD-10-CM | POA: Diagnosis not present

## 2016-06-27 DIAGNOSIS — E1140 Type 2 diabetes mellitus with diabetic neuropathy, unspecified: Secondary | ICD-10-CM | POA: Diagnosis not present

## 2016-06-27 DIAGNOSIS — Z0000 Encounter for general adult medical examination without abnormal findings: Secondary | ICD-10-CM | POA: Diagnosis not present

## 2016-06-27 DIAGNOSIS — J449 Chronic obstructive pulmonary disease, unspecified: Secondary | ICD-10-CM | POA: Diagnosis not present

## 2016-07-01 DIAGNOSIS — R05 Cough: Secondary | ICD-10-CM | POA: Diagnosis not present

## 2016-07-05 DIAGNOSIS — E1140 Type 2 diabetes mellitus with diabetic neuropathy, unspecified: Secondary | ICD-10-CM | POA: Diagnosis not present

## 2016-07-05 DIAGNOSIS — E785 Hyperlipidemia, unspecified: Secondary | ICD-10-CM | POA: Diagnosis not present

## 2016-07-05 DIAGNOSIS — Z7984 Long term (current) use of oral hypoglycemic drugs: Secondary | ICD-10-CM | POA: Diagnosis not present

## 2016-07-17 DIAGNOSIS — R351 Nocturia: Secondary | ICD-10-CM | POA: Diagnosis not present

## 2016-07-17 DIAGNOSIS — N401 Enlarged prostate with lower urinary tract symptoms: Secondary | ICD-10-CM | POA: Diagnosis not present

## 2016-07-26 ENCOUNTER — Encounter: Payer: Self-pay | Admitting: Pulmonary Disease

## 2016-07-26 DIAGNOSIS — K3189 Other diseases of stomach and duodenum: Secondary | ICD-10-CM | POA: Diagnosis not present

## 2016-07-26 DIAGNOSIS — D126 Benign neoplasm of colon, unspecified: Secondary | ICD-10-CM | POA: Diagnosis not present

## 2016-07-26 DIAGNOSIS — Z86010 Personal history of colonic polyps: Secondary | ICD-10-CM | POA: Diagnosis not present

## 2016-07-26 DIAGNOSIS — R05 Cough: Secondary | ICD-10-CM | POA: Diagnosis not present

## 2016-07-26 DIAGNOSIS — D122 Benign neoplasm of ascending colon: Secondary | ICD-10-CM | POA: Diagnosis not present

## 2016-07-29 DIAGNOSIS — E119 Type 2 diabetes mellitus without complications: Secondary | ICD-10-CM | POA: Diagnosis not present

## 2016-07-29 DIAGNOSIS — H353131 Nonexudative age-related macular degeneration, bilateral, early dry stage: Secondary | ICD-10-CM | POA: Diagnosis not present

## 2016-07-29 DIAGNOSIS — Z0100 Encounter for examination of eyes and vision without abnormal findings: Secondary | ICD-10-CM | POA: Diagnosis not present

## 2016-07-29 DIAGNOSIS — H2513 Age-related nuclear cataract, bilateral: Secondary | ICD-10-CM | POA: Diagnosis not present

## 2016-07-30 DIAGNOSIS — D126 Benign neoplasm of colon, unspecified: Secondary | ICD-10-CM | POA: Diagnosis not present

## 2016-08-07 DIAGNOSIS — M79641 Pain in right hand: Secondary | ICD-10-CM | POA: Diagnosis not present

## 2016-08-07 DIAGNOSIS — M7542 Impingement syndrome of left shoulder: Secondary | ICD-10-CM | POA: Diagnosis not present

## 2016-08-08 ENCOUNTER — Encounter: Payer: Self-pay | Admitting: Podiatry

## 2016-08-08 ENCOUNTER — Ambulatory Visit (INDEPENDENT_AMBULATORY_CARE_PROVIDER_SITE_OTHER): Payer: PPO | Admitting: Podiatry

## 2016-08-08 VITALS — Ht 70.0 in | Wt 213.0 lb

## 2016-08-08 DIAGNOSIS — M79676 Pain in unspecified toe(s): Secondary | ICD-10-CM | POA: Diagnosis not present

## 2016-08-08 DIAGNOSIS — B351 Tinea unguium: Secondary | ICD-10-CM

## 2016-08-08 DIAGNOSIS — E1140 Type 2 diabetes mellitus with diabetic neuropathy, unspecified: Secondary | ICD-10-CM

## 2016-08-08 NOTE — Progress Notes (Signed)
Patient ID: [name], male   DOB: 06/20/1936, 80 y.o.   MRN: [id number] Complaint:  Visit Type: Patient returns to my office for continued preventative foot care services. Complaint: Patient states" my nails have grown long and thick and become painful to walk and wear shoes" Patient has been diagnosed with DM with neuropathy. He presents for preventative foot care services. No changes to ROS  Podiatric Exam: Vascular: dorsalis pedis and posterior tibial pulses are palpable bilateral. Capillary return is immediate. Temperature gradient is WNL. Skin turgor WNL  Sensorium: Diminishedl Semmes Weinstein monofilament test. Normal tactile sensation bilaterally. Nail Exam: Pt has thick disfigured discolored nails with subungual debris noted bilateral entire nail hallux through fifth toenails Ulcer Exam: There is no evidence of ulcer or pre-ulcerative changes or infection. Orthopedic Exam: Muscle tone and strength are WNL. No limitations in general ROM. No crepitus or effusions noted. Foot type and digits show no abnormalities. Bony prominences are unremarkable.Hammer toes 2-5 B/L Skin: No Porokeratosis. No infection or ulcers  Diagnosis:  Tinea unguium, Pain in right toe, pain in left toes  Treatment & Plan Procedures and Treatment: Consent by patient was obtained for treatment procedures. The patient understood the discussion of treatment and procedures well. All questions were answered thoroughly reviewed. Debridement of mycotic and hypertrophic toenails, 1 through 5 bilateral and clearing of subungual debris. No ulceration, no infection noted.  Return Visit-Office Procedure: Patient instructed to return to the office for a follow up visit 3 months for continued evaluation and treatment.  Wynell Halberg DPM 

## 2016-09-02 ENCOUNTER — Ambulatory Visit: Payer: PPO | Admitting: Internal Medicine

## 2016-09-19 DIAGNOSIS — I10 Essential (primary) hypertension: Secondary | ICD-10-CM | POA: Diagnosis not present

## 2016-09-19 DIAGNOSIS — M1990 Unspecified osteoarthritis, unspecified site: Secondary | ICD-10-CM | POA: Diagnosis not present

## 2016-10-02 DIAGNOSIS — J069 Acute upper respiratory infection, unspecified: Secondary | ICD-10-CM | POA: Diagnosis not present

## 2016-10-17 ENCOUNTER — Ambulatory Visit: Payer: PPO | Admitting: Podiatry

## 2016-10-23 ENCOUNTER — Ambulatory Visit (INDEPENDENT_AMBULATORY_CARE_PROVIDER_SITE_OTHER): Payer: PPO | Admitting: Podiatry

## 2016-10-23 DIAGNOSIS — B351 Tinea unguium: Secondary | ICD-10-CM | POA: Diagnosis not present

## 2016-10-23 DIAGNOSIS — M79676 Pain in unspecified toe(s): Secondary | ICD-10-CM | POA: Diagnosis not present

## 2016-10-23 DIAGNOSIS — E1140 Type 2 diabetes mellitus with diabetic neuropathy, unspecified: Secondary | ICD-10-CM

## 2016-10-23 DIAGNOSIS — L600 Ingrowing nail: Secondary | ICD-10-CM

## 2016-10-23 NOTE — Progress Notes (Signed)
Patient ID: [name], male   DOB: 1935/12/29, 81 y.o.   MRN: [id number] Complaint:  Visit Type: Patient returns to my office for continued preventative foot care services. Complaint: Patient states" my nails have grown long and thick and become painful to walk and wear shoes" Patient has been diagnosed with DM with neuropathy. He presents for preventative foot care services. No changes to ROS  Podiatric Exam: Vascular: dorsalis pedis and posterior tibial pulses are palpable bilateral. Capillary return is immediate. Temperature gradient is WNL. Skin turgor WNL  Sensorium: Diminishedl Semmes Weinstein monofilament test. Normal tactile sensation bilaterally. Nail Exam: Pt has thick disfigured discolored nails with subungual debris noted bilateral entire nail hallux through fifth toenails Ulcer Exam: There is no evidence of ulcer or pre-ulcerative changes or infection. Orthopedic Exam: Muscle tone and strength are WNL. No limitations in general ROM. No crepitus or effusions noted. Foot type and digits show no abnormalities. Bony prominences are unremarkable.Hammer toes 2-5 B/L Skin: No Porokeratosis. No infection or ulcers  Diagnosis:  Tinea unguium, Pain in right toe, pain in left toes  Treatment & Plan Procedures and Treatment: Consent by patient was obtained for treatment procedures. The patient understood the discussion of treatment and procedures well. All questions were answered thoroughly reviewed. Debridement of mycotic and hypertrophic toenails, 1 through 5 bilateral and clearing of subungual debris. No ulceration, no infection noted.  Return Visit-Office Procedure: Patient instructed to return to the office for a follow up visit 3 months for continued evaluation and treatment.  Gardiner Barefoot DPM

## 2016-11-04 ENCOUNTER — Other Ambulatory Visit: Payer: Self-pay | Admitting: Internal Medicine

## 2016-11-11 DIAGNOSIS — R05 Cough: Secondary | ICD-10-CM | POA: Diagnosis not present

## 2016-11-11 DIAGNOSIS — K219 Gastro-esophageal reflux disease without esophagitis: Secondary | ICD-10-CM | POA: Diagnosis not present

## 2016-11-15 DIAGNOSIS — T1512XA Foreign body in conjunctival sac, left eye, initial encounter: Secondary | ICD-10-CM | POA: Diagnosis not present

## 2016-11-22 DIAGNOSIS — M7542 Impingement syndrome of left shoulder: Secondary | ICD-10-CM | POA: Diagnosis not present

## 2016-12-05 DIAGNOSIS — M76821 Posterior tibial tendinitis, right leg: Secondary | ICD-10-CM | POA: Diagnosis not present

## 2016-12-05 DIAGNOSIS — M7751 Other enthesopathy of right foot: Secondary | ICD-10-CM | POA: Diagnosis not present

## 2016-12-05 DIAGNOSIS — M7752 Other enthesopathy of left foot: Secondary | ICD-10-CM | POA: Diagnosis not present

## 2016-12-05 DIAGNOSIS — M19071 Primary osteoarthritis, right ankle and foot: Secondary | ICD-10-CM | POA: Diagnosis not present

## 2016-12-05 DIAGNOSIS — M19072 Primary osteoarthritis, left ankle and foot: Secondary | ICD-10-CM | POA: Diagnosis not present

## 2016-12-12 DIAGNOSIS — H5712 Ocular pain, left eye: Secondary | ICD-10-CM | POA: Diagnosis not present

## 2016-12-18 DIAGNOSIS — M1812 Unilateral primary osteoarthritis of first carpometacarpal joint, left hand: Secondary | ICD-10-CM | POA: Diagnosis not present

## 2016-12-18 DIAGNOSIS — M1811 Unilateral primary osteoarthritis of first carpometacarpal joint, right hand: Secondary | ICD-10-CM | POA: Diagnosis not present

## 2017-01-01 ENCOUNTER — Ambulatory Visit: Payer: PPO | Admitting: Podiatry

## 2017-01-02 DIAGNOSIS — J449 Chronic obstructive pulmonary disease, unspecified: Secondary | ICD-10-CM | POA: Diagnosis not present

## 2017-01-02 DIAGNOSIS — E1140 Type 2 diabetes mellitus with diabetic neuropathy, unspecified: Secondary | ICD-10-CM | POA: Diagnosis not present

## 2017-01-02 DIAGNOSIS — I10 Essential (primary) hypertension: Secondary | ICD-10-CM | POA: Diagnosis not present

## 2017-01-02 DIAGNOSIS — E785 Hyperlipidemia, unspecified: Secondary | ICD-10-CM | POA: Diagnosis not present

## 2017-01-02 DIAGNOSIS — I723 Aneurysm of iliac artery: Secondary | ICD-10-CM | POA: Diagnosis not present

## 2017-04-11 DIAGNOSIS — Z23 Encounter for immunization: Secondary | ICD-10-CM | POA: Diagnosis not present

## 2017-04-24 DIAGNOSIS — M1811 Unilateral primary osteoarthritis of first carpometacarpal joint, right hand: Secondary | ICD-10-CM | POA: Diagnosis not present

## 2017-04-24 DIAGNOSIS — M1812 Unilateral primary osteoarthritis of first carpometacarpal joint, left hand: Secondary | ICD-10-CM | POA: Diagnosis not present

## 2017-05-16 DIAGNOSIS — M1811 Unilateral primary osteoarthritis of first carpometacarpal joint, right hand: Secondary | ICD-10-CM | POA: Diagnosis not present

## 2017-05-16 DIAGNOSIS — M1812 Unilateral primary osteoarthritis of first carpometacarpal joint, left hand: Secondary | ICD-10-CM | POA: Diagnosis not present

## 2017-05-27 DIAGNOSIS — D225 Melanocytic nevi of trunk: Secondary | ICD-10-CM | POA: Diagnosis not present

## 2017-05-27 DIAGNOSIS — Z85828 Personal history of other malignant neoplasm of skin: Secondary | ICD-10-CM | POA: Diagnosis not present

## 2017-05-27 DIAGNOSIS — D1801 Hemangioma of skin and subcutaneous tissue: Secondary | ICD-10-CM | POA: Diagnosis not present

## 2017-05-27 DIAGNOSIS — L821 Other seborrheic keratosis: Secondary | ICD-10-CM | POA: Diagnosis not present

## 2017-05-30 DIAGNOSIS — R5382 Chronic fatigue, unspecified: Secondary | ICD-10-CM | POA: Diagnosis not present

## 2017-06-10 DIAGNOSIS — K219 Gastro-esophageal reflux disease without esophagitis: Secondary | ICD-10-CM | POA: Diagnosis not present

## 2017-06-16 DIAGNOSIS — M7541 Impingement syndrome of right shoulder: Secondary | ICD-10-CM | POA: Diagnosis not present

## 2017-06-16 DIAGNOSIS — M7542 Impingement syndrome of left shoulder: Secondary | ICD-10-CM | POA: Diagnosis not present

## 2017-07-02 DIAGNOSIS — J449 Chronic obstructive pulmonary disease, unspecified: Secondary | ICD-10-CM | POA: Diagnosis not present

## 2017-07-02 DIAGNOSIS — E785 Hyperlipidemia, unspecified: Secondary | ICD-10-CM | POA: Diagnosis not present

## 2017-07-02 DIAGNOSIS — E1140 Type 2 diabetes mellitus with diabetic neuropathy, unspecified: Secondary | ICD-10-CM | POA: Diagnosis not present

## 2017-07-02 DIAGNOSIS — I10 Essential (primary) hypertension: Secondary | ICD-10-CM | POA: Diagnosis not present

## 2017-07-02 DIAGNOSIS — Z0000 Encounter for general adult medical examination without abnormal findings: Secondary | ICD-10-CM | POA: Diagnosis not present

## 2017-07-02 DIAGNOSIS — I723 Aneurysm of iliac artery: Secondary | ICD-10-CM | POA: Diagnosis not present

## 2017-07-02 DIAGNOSIS — M1990 Unspecified osteoarthritis, unspecified site: Secondary | ICD-10-CM | POA: Diagnosis not present

## 2017-07-30 DIAGNOSIS — M7540 Impingement syndrome of unspecified shoulder: Secondary | ICD-10-CM | POA: Diagnosis not present

## 2017-08-12 DIAGNOSIS — N401 Enlarged prostate with lower urinary tract symptoms: Secondary | ICD-10-CM | POA: Diagnosis not present

## 2017-08-12 DIAGNOSIS — R351 Nocturia: Secondary | ICD-10-CM | POA: Diagnosis not present

## 2017-08-13 DIAGNOSIS — L080 Pyoderma: Secondary | ICD-10-CM | POA: Diagnosis not present

## 2017-08-13 DIAGNOSIS — Z85828 Personal history of other malignant neoplasm of skin: Secondary | ICD-10-CM | POA: Diagnosis not present

## 2017-08-13 DIAGNOSIS — E11621 Type 2 diabetes mellitus with foot ulcer: Secondary | ICD-10-CM | POA: Diagnosis not present

## 2017-08-13 DIAGNOSIS — L0889 Other specified local infections of the skin and subcutaneous tissue: Secondary | ICD-10-CM | POA: Diagnosis not present

## 2017-08-20 DIAGNOSIS — M2560 Stiffness of unspecified joint, not elsewhere classified: Secondary | ICD-10-CM | POA: Diagnosis not present

## 2017-08-20 DIAGNOSIS — M7989 Other specified soft tissue disorders: Secondary | ICD-10-CM | POA: Diagnosis not present

## 2017-08-20 DIAGNOSIS — M1990 Unspecified osteoarthritis, unspecified site: Secondary | ICD-10-CM | POA: Diagnosis not present

## 2017-08-29 ENCOUNTER — Encounter: Payer: Self-pay | Admitting: Podiatry

## 2017-08-29 ENCOUNTER — Ambulatory Visit: Payer: PPO | Admitting: Podiatry

## 2017-08-29 DIAGNOSIS — M79676 Pain in unspecified toe(s): Secondary | ICD-10-CM | POA: Diagnosis not present

## 2017-08-29 DIAGNOSIS — E1140 Type 2 diabetes mellitus with diabetic neuropathy, unspecified: Secondary | ICD-10-CM

## 2017-08-29 DIAGNOSIS — B351 Tinea unguium: Secondary | ICD-10-CM

## 2017-08-29 DIAGNOSIS — L84 Corns and callosities: Secondary | ICD-10-CM | POA: Diagnosis not present

## 2017-08-29 DIAGNOSIS — L600 Ingrowing nail: Secondary | ICD-10-CM

## 2017-08-29 NOTE — Progress Notes (Signed)
This patient presents the office with chief complaint of a painful corn on the fourth toe, right foot.  He says this corn is painful walking and wearing his shoes.  He says he believes there was pus in this corn approximately 4 weeks ago.  He also presents to the office saying he has pain along the outside border big toenail right foot.  This patient is diabetic taking medication by mouth.   He has been diagnosed as diabetic with neuropathy.  Finally, he presents the office today for preventative foot care services.   General Appearance  Alert, conversant and in no acute stress.  Vascular  Dorsalis pedis and posterior pulses are palpable  bilaterally.  Capillary return is within normal limits  bilaterally. Temperature is within normal limits  Bilaterally.  Neurologic  Senn-Weinstein monofilament wire test diminished   bilaterally. Muscle power within normal limits bilaterally.  Nails Thick disfigured discolored nails with subungual debris bilaterally from hallux to fifth toes bilaterally. No evidence of bacterial infection or drainage bilaterally. Patient has a pincer toenail on the right hallux with dried abscess noted along the lateral border right great toenail.  Orthopedic  No limitations of motion of motion feet bilaterally.  No crepitus or effusions noted.  No bony pathology or digital deformities noted.ammertoes 2 through 5 bilaterally. Hallux malleus  B/L Mild swelling right ankle.  Skin  normotropic skin with no porokeratosis noted bilaterally.  No signs of infections or ulcers noted.     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  ROV  Debride corn fourth toe right foot.   Debride onychomycotic nails  B/L  Healing paronychia lateral border right great toe.  Home instructions given.  Discussed surgical correction with patient in the future.   Gardiner Barefoot DPM

## 2017-09-04 DIAGNOSIS — M2550 Pain in unspecified joint: Secondary | ICD-10-CM | POA: Diagnosis not present

## 2017-09-04 DIAGNOSIS — I10 Essential (primary) hypertension: Secondary | ICD-10-CM | POA: Diagnosis not present

## 2017-09-04 DIAGNOSIS — M7989 Other specified soft tissue disorders: Secondary | ICD-10-CM | POA: Diagnosis not present

## 2017-09-05 DIAGNOSIS — M19071 Primary osteoarthritis, right ankle and foot: Secondary | ICD-10-CM | POA: Diagnosis not present

## 2017-09-05 DIAGNOSIS — M25572 Pain in left ankle and joints of left foot: Secondary | ICD-10-CM | POA: Diagnosis not present

## 2017-09-05 DIAGNOSIS — M5030 Other cervical disc degeneration, unspecified cervical region: Secondary | ICD-10-CM | POA: Diagnosis not present

## 2017-09-05 DIAGNOSIS — M79642 Pain in left hand: Secondary | ICD-10-CM | POA: Diagnosis not present

## 2017-09-05 DIAGNOSIS — M069 Rheumatoid arthritis, unspecified: Secondary | ICD-10-CM | POA: Diagnosis not present

## 2017-09-05 DIAGNOSIS — M79641 Pain in right hand: Secondary | ICD-10-CM | POA: Diagnosis not present

## 2017-09-05 DIAGNOSIS — M19072 Primary osteoarthritis, left ankle and foot: Secondary | ICD-10-CM | POA: Diagnosis not present

## 2017-09-05 DIAGNOSIS — M1990 Unspecified osteoarthritis, unspecified site: Secondary | ICD-10-CM | POA: Diagnosis not present

## 2017-09-05 DIAGNOSIS — M542 Cervicalgia: Secondary | ICD-10-CM | POA: Diagnosis not present

## 2017-09-05 DIAGNOSIS — M19042 Primary osteoarthritis, left hand: Secondary | ICD-10-CM | POA: Diagnosis not present

## 2017-09-05 DIAGNOSIS — M19041 Primary osteoarthritis, right hand: Secondary | ICD-10-CM | POA: Diagnosis not present

## 2017-09-05 DIAGNOSIS — M064 Inflammatory polyarthropathy: Secondary | ICD-10-CM | POA: Diagnosis not present

## 2017-09-05 DIAGNOSIS — M1711 Unilateral primary osteoarthritis, right knee: Secondary | ICD-10-CM | POA: Diagnosis not present

## 2017-09-05 DIAGNOSIS — M25571 Pain in right ankle and joints of right foot: Secondary | ICD-10-CM | POA: Diagnosis not present

## 2017-09-05 DIAGNOSIS — M7989 Other specified soft tissue disorders: Secondary | ICD-10-CM | POA: Diagnosis not present

## 2017-09-05 DIAGNOSIS — M1712 Unilateral primary osteoarthritis, left knee: Secondary | ICD-10-CM | POA: Diagnosis not present

## 2017-09-10 ENCOUNTER — Ambulatory Visit
Admission: RE | Admit: 2017-09-10 | Discharge: 2017-09-10 | Disposition: A | Discharge status: Home / Self Care | Payer: PPO | Source: Ambulatory Visit | Attending: Family Medicine | Admitting: Family Medicine

## 2017-09-10 ENCOUNTER — Other Ambulatory Visit: Payer: Self-pay | Admitting: Family Medicine

## 2017-09-10 DIAGNOSIS — M7989 Other specified soft tissue disorders: Secondary | ICD-10-CM

## 2017-09-15 DIAGNOSIS — I10 Essential (primary) hypertension: Secondary | ICD-10-CM | POA: Diagnosis not present

## 2017-09-15 DIAGNOSIS — E1140 Type 2 diabetes mellitus with diabetic neuropathy, unspecified: Secondary | ICD-10-CM | POA: Diagnosis not present

## 2017-09-15 DIAGNOSIS — M79606 Pain in leg, unspecified: Secondary | ICD-10-CM | POA: Diagnosis not present

## 2017-09-19 ENCOUNTER — Ambulatory Visit: Payer: PPO | Admitting: Podiatry

## 2017-09-23 ENCOUNTER — Encounter: Payer: Self-pay | Admitting: Podiatry

## 2017-09-23 ENCOUNTER — Ambulatory Visit: Payer: PPO | Admitting: Podiatry

## 2017-09-23 VITALS — BP 135/87 | HR 100

## 2017-09-23 DIAGNOSIS — L03032 Cellulitis of left toe: Secondary | ICD-10-CM | POA: Diagnosis not present

## 2017-09-23 DIAGNOSIS — E1140 Type 2 diabetes mellitus with diabetic neuropathy, unspecified: Secondary | ICD-10-CM

## 2017-09-23 NOTE — Progress Notes (Signed)
Patient ID: [name], male   DOB: 1935/07/19, 82 y.o.   MRN: [id number] Complaint:  Visit Type: Patient returns to my office for painful ingrown toenail outside border right big toe.  There is fungal infection with marked incurvation outside border right big toe.  Pain is noted with no drainage.  Podiatric Exam: Vascular: dorsalis pedis and posterior tibial pulses are palpable bilateral. Capillary return is immediate. Temperature gradient is WNL. Skin turgor WNL  Sensorium: Diminished Semmes Weinstein monofilament test. Normal tactile sensation bilaterally. Nail Exam: Pt has thick disfigured discolored nails with subungual debris noted bilateral entire nail hallux through fifth toenails.  Marked incurvation noted right great toenail. Ulcer Exam: There is no evidence of ulcer or pre-ulcerative changes or infection. Orthopedic Exam: Muscle tone and strength are WNL. No limitations in general ROM. No crepitus or effusions noted. Foot type and digits show no abnormalities. Bony prominences are unremarkable.Hammer toes 2-5 B/L Skin: No Porokeratosis. No infection or ulcers  Diagnosis:  Paronychia right great toenail  Treatment & Plan Procedures and Treatment: Consent by patient was obtained for treatment procedures. The patient understood the discussion of treatment and procedures well. All questions were answered thoroughly reviewed. Debridement of mycotic and hypertrophic toenails, 1 through 5 bilateral and clearing of subungual debris. No ulceration, no infection noted.  Return Visit-Office Procedure: Patient instructed to return to the office for a follow up visit 2 months for continued evaluation and treatment.  Gardiner Barefoot DPM

## 2017-09-25 DIAGNOSIS — M069 Rheumatoid arthritis, unspecified: Secondary | ICD-10-CM | POA: Diagnosis not present

## 2017-09-25 DIAGNOSIS — M064 Inflammatory polyarthropathy: Secondary | ICD-10-CM | POA: Diagnosis not present

## 2017-09-25 DIAGNOSIS — M1990 Unspecified osteoarthritis, unspecified site: Secondary | ICD-10-CM | POA: Diagnosis not present

## 2017-10-07 DIAGNOSIS — R05 Cough: Secondary | ICD-10-CM | POA: Diagnosis not present

## 2017-10-23 DIAGNOSIS — J449 Chronic obstructive pulmonary disease, unspecified: Secondary | ICD-10-CM | POA: Diagnosis not present

## 2017-10-23 DIAGNOSIS — E1140 Type 2 diabetes mellitus with diabetic neuropathy, unspecified: Secondary | ICD-10-CM | POA: Diagnosis not present

## 2017-11-03 DIAGNOSIS — Z79899 Other long term (current) drug therapy: Secondary | ICD-10-CM | POA: Diagnosis not present

## 2017-11-03 DIAGNOSIS — M1990 Unspecified osteoarthritis, unspecified site: Secondary | ICD-10-CM | POA: Diagnosis not present

## 2017-11-03 DIAGNOSIS — M064 Inflammatory polyarthropathy: Secondary | ICD-10-CM | POA: Diagnosis not present

## 2017-11-03 DIAGNOSIS — M069 Rheumatoid arthritis, unspecified: Secondary | ICD-10-CM | POA: Diagnosis not present

## 2017-11-26 ENCOUNTER — Ambulatory Visit: Payer: PPO | Admitting: Podiatry

## 2017-11-27 DIAGNOSIS — M069 Rheumatoid arthritis, unspecified: Secondary | ICD-10-CM | POA: Diagnosis not present

## 2017-11-27 DIAGNOSIS — M064 Inflammatory polyarthropathy: Secondary | ICD-10-CM | POA: Diagnosis not present

## 2017-11-27 DIAGNOSIS — Z79899 Other long term (current) drug therapy: Secondary | ICD-10-CM | POA: Diagnosis not present

## 2017-11-27 DIAGNOSIS — M6281 Muscle weakness (generalized): Secondary | ICD-10-CM | POA: Diagnosis not present

## 2017-11-27 DIAGNOSIS — M1990 Unspecified osteoarthritis, unspecified site: Secondary | ICD-10-CM | POA: Diagnosis not present

## 2017-11-28 DIAGNOSIS — M79671 Pain in right foot: Secondary | ICD-10-CM | POA: Diagnosis not present

## 2017-11-28 DIAGNOSIS — M2042 Other hammer toe(s) (acquired), left foot: Secondary | ICD-10-CM | POA: Diagnosis not present

## 2017-11-28 DIAGNOSIS — M79672 Pain in left foot: Secondary | ICD-10-CM | POA: Diagnosis not present

## 2017-11-28 DIAGNOSIS — L03031 Cellulitis of right toe: Secondary | ICD-10-CM | POA: Diagnosis not present

## 2017-11-28 DIAGNOSIS — E139 Other specified diabetes mellitus without complications: Secondary | ICD-10-CM | POA: Diagnosis not present

## 2017-12-02 ENCOUNTER — Other Ambulatory Visit: Payer: Self-pay | Admitting: Internal Medicine

## 2017-12-15 DIAGNOSIS — J449 Chronic obstructive pulmonary disease, unspecified: Secondary | ICD-10-CM | POA: Diagnosis not present

## 2017-12-15 DIAGNOSIS — E785 Hyperlipidemia, unspecified: Secondary | ICD-10-CM | POA: Diagnosis not present

## 2017-12-15 DIAGNOSIS — E1140 Type 2 diabetes mellitus with diabetic neuropathy, unspecified: Secondary | ICD-10-CM | POA: Diagnosis not present

## 2017-12-15 DIAGNOSIS — Z7984 Long term (current) use of oral hypoglycemic drugs: Secondary | ICD-10-CM | POA: Diagnosis not present

## 2017-12-15 DIAGNOSIS — I723 Aneurysm of iliac artery: Secondary | ICD-10-CM | POA: Diagnosis not present

## 2017-12-15 DIAGNOSIS — E1169 Type 2 diabetes mellitus with other specified complication: Secondary | ICD-10-CM | POA: Diagnosis not present

## 2017-12-18 DIAGNOSIS — L03031 Cellulitis of right toe: Secondary | ICD-10-CM | POA: Diagnosis not present

## 2017-12-18 DIAGNOSIS — M2042 Other hammer toe(s) (acquired), left foot: Secondary | ICD-10-CM | POA: Diagnosis not present

## 2017-12-18 DIAGNOSIS — E139 Other specified diabetes mellitus without complications: Secondary | ICD-10-CM | POA: Diagnosis not present

## 2017-12-18 DIAGNOSIS — M2041 Other hammer toe(s) (acquired), right foot: Secondary | ICD-10-CM | POA: Diagnosis not present

## 2017-12-29 DIAGNOSIS — R202 Paresthesia of skin: Secondary | ICD-10-CM | POA: Diagnosis not present

## 2017-12-31 ENCOUNTER — Encounter: Payer: Self-pay | Admitting: Neurology

## 2017-12-31 DIAGNOSIS — E1169 Type 2 diabetes mellitus with other specified complication: Secondary | ICD-10-CM | POA: Diagnosis not present

## 2017-12-31 DIAGNOSIS — E785 Hyperlipidemia, unspecified: Secondary | ICD-10-CM | POA: Diagnosis not present

## 2018-01-01 ENCOUNTER — Encounter: Payer: Self-pay | Admitting: Neurology

## 2018-01-01 ENCOUNTER — Telehealth: Payer: Self-pay | Admitting: Neurology

## 2018-01-01 ENCOUNTER — Ambulatory Visit (INDEPENDENT_AMBULATORY_CARE_PROVIDER_SITE_OTHER): Payer: PPO | Admitting: Neurology

## 2018-01-01 VITALS — BP 123/83 | HR 98 | Ht 70.0 in | Wt 213.0 lb

## 2018-01-01 DIAGNOSIS — R269 Unspecified abnormalities of gait and mobility: Secondary | ICD-10-CM

## 2018-01-01 DIAGNOSIS — R2689 Other abnormalities of gait and mobility: Secondary | ICD-10-CM | POA: Diagnosis not present

## 2018-01-01 DIAGNOSIS — G63 Polyneuropathy in diseases classified elsewhere: Secondary | ICD-10-CM

## 2018-01-01 DIAGNOSIS — I723 Aneurysm of iliac artery: Secondary | ICD-10-CM

## 2018-01-01 DIAGNOSIS — M4807 Spinal stenosis, lumbosacral region: Secondary | ICD-10-CM

## 2018-01-01 DIAGNOSIS — E349 Endocrine disorder, unspecified: Secondary | ICD-10-CM

## 2018-01-01 NOTE — Telephone Encounter (Signed)
health team order sent to GI. They will reach out to the pt to schedule.

## 2018-01-01 NOTE — Patient Instructions (Signed)
Diabetic Neuropathy Diabetic neuropathy is a nerve disease or nerve damage that is caused by diabetes mellitus. About half of all people with diabetes mellitus have some form of nerve damage. Nerve damage is more common in those who have had diabetes mellitus for many years and who generally have not had good control of their blood sugar (glucose) level. Diabetic neuropathy is a common complication of diabetes mellitus. There are three common types of diabetic neuropathy and a fourth type that is less common and less understood:  Peripheral neuropathy-This is the most common type of diabetic neuropathy. It causes damage to the nerves of the feet and legs first and then eventually the hands and arms. The damage affects the ability to sense touch.  Autonomic neuropathy-This type causes damage to the autonomic nervous system, which controls the following functions: ? Heartbeat. ? Body temperature. ? Blood pressure. ? Urination. ? Digestion. ? Sweating. ? Sexual function.  Focal neuropathy-Focal neuropathy can be painful and unpredictable and occurs most often in older adults with diabetes mellitus. It involves a specific nerve or one area and often comes on suddenly. It usually does not cause long-term problems.  Radiculoplexus neuropathy- Sometimes called lumbosacral radiculoplexus neuropathy, radiculoplexus neuropathy affects the nerves of the thighs, hips, buttocks, or legs. It is more common in people with type 2 diabetes mellitus and in older men. It is characterized by debilitating pain, weakness, and atrophy, usually in the thigh muscles.  What are the causes? The cause of peripheral, autonomic, and focal neuropathies is diabetes mellitus that is uncontrolled and high glucose levels. The cause of radiculoplexus neuropathy is unknown. However, it is thought to be caused by inflammation related to uncontrolled glucose levels. What are the signs or symptoms? Peripheral Neuropathy Peripheral  neuropathy develops slowly over time. When the nerves of the feet and legs no longer work there may be:  Burning, stabbing, or aching pain in the legs or feet.  Inability to feel pressure or pain in your feet. This can lead to: ? Thick calluses over pressure areas. ? Pressure sores. ? Ulcers.  Foot deformities.  Reduced ability to feel temperature changes.  Muscle weakness.  Autonomic Neuropathy The symptoms of autonomic neuropathy vary depending on which nerves are affected. Symptoms may include:  Problems with digestion, such as: ? Feeling sick to your stomach (nausea). ? Vomiting. ? Bloating. ? Constipation. ? Diarrhea. ? Abdominal pain.  Difficulty with urination. This occurs if you lose your ability to sense when your bladder is full. Problems include: ? Urine leakage (incontinence). ? Inability to empty your bladder completely (retention).  Rapid or irregular heartbeat (palpitations).  Blood pressure drops when you stand up (orthostatic hypotension). When you stand up you may feel: ? Dizzy. ? Weak. ? Faint.  In men, inability to attain and maintain an erection.  In women, vaginal dryness and problems with decreased sexual desire and arousal.  Problems with body temperature regulation.  Increased or decreased sweating.  Focal Neuropathy  Abnormal eye movements or abnormal alignment of both eyes.  Weakness in the wrist.  Foot drop. This results in an inability to lift the foot properly and abnormal walking or foot movement.  Paralysis on one side of your face (Bell palsy).  Chest or abdominal pain. Radiculoplexus Neuropathy  Sudden, severe pain in your hip, thigh, or buttocks.  Weakness and wasting of thigh muscles.  Difficulty rising from a seated position.  Abdominal swelling.  Unexplained weight loss (usually more than 10 lb [4.5 kg]). How is   this diagnosed? Peripheral Neuropathy Your senses may be tested. Sensory function testing can be  done with:  A light touch using a monofilament.  A vibration with tuning fork.  A sharp sensation with a pin prick.  Other tests that can help diagnose neuropathy are:  Nerve conduction velocity. This test checks the transmission of an electrical current through a nerve.  Electromyography. This shows how muscles respond to electrical signals transmitted by nearby nerves.  Quantitative sensory testing. This is used to assess how your nerves respond to vibrations and changes in temperature.  Autonomic Neuropathy Diagnosis is often based on reported symptoms. Tell your health care provider if you experience:  Dizziness.  Constipation.  Diarrhea.  Inappropriate urination or inability to urinate.  Inability to get or maintain an erection.  Tests that may be done include:  Electrocardiography or Holter monitor. These are tests that can help show problems with the heart rate or heart rhythm.  An X-ray exam may be done.  Focal Neuropathy Diagnosis is made based on your symptoms and what your health care provider finds during your exam. Other tests may be done. They may include:  Nerve conduction velocities. This checks the transmission of electrical current through a nerve.  Electromyography. This shows how muscles respond to electrical signals transmitted by nearby nerves.  Quantitative sensory testing. This test is used to assess how your nerves respond to vibration and changes in temperature.  Radiculoplexus Neuropathy  Often the first thing is to eliminate any other issue or problems that might be the cause, as there is no standard test for diagnosis.  X-ray exam of your spine and lumbar region.  Spinal tap to rule out cancer.  MRI to rule out other lesions. How is this treated? Once nerve damage occurs, it cannot be reversed. The goal of treatment is to keep the disease or nerve damage from getting worse and affecting more nerve fibers. Controlling your blood  glucose level is the key. Most people with radiculoplexus neuropathy see at least a partial improvement over time. You will need to keep your blood glucose and HbA1c levels in the target range determined by your health care provider. Things that help control blood glucose levels include:  Blood glucose monitoring.  Meal planning.  Physical activity.  Diabetes medicine.  Over time, maintaining lower blood glucose levels helps lessen symptoms. Sometimes, prescription pain medicine is needed. Follow these instructions at home:  Do not smoke.  Keep your blood glucose level in the range that you and your health care provider have determined acceptable for you.  Keep your blood pressure level in the range that you and your health care provider have determined acceptable for you.  Eat a well-balanced diet.  Be physically active every day. Include strength training and balance exercises.  Protect your feet. ? Check your feet every day for sores, cuts, blisters, or signs of infection. ? Wear padded socks and supportive shoes. Use orthotic inserts, if necessary. ? Regularly check the insides of your shoes for worn spots. Make sure there are no rocks or other items inside your shoes before you put them on. Contact a health care provider if:  You have burning, stabbing, or aching pain in the legs or feet.  You are unable to feel pressure or pain in your feet.  You develop problems with digestion such as: ? Nausea. ? Vomiting. ? Bloating. ? Constipation. ? Diarrhea. ? Abdominal pain.  You have difficulty with urination, such as: ? Incontinence. ? Retention.    You have palpitations.  You develop orthostatic hypotension. When you stand up you may feel: ? Dizzy. ? Weak. ? Faint.  You cannot attain and maintain an erection (in men).  You have vaginal dryness and problems with decreased sexual desire and arousal (in women).  You have severe pain in your thighs, legs, or  buttocks.  You have unexplained weight loss. This information is not intended to replace advice given to you by your health care provider. Make sure you discuss any questions you have with your health care provider. Document Released: 09/09/2001 Document Revised: 12/07/2015 Document Reviewed: 12/10/2012 [employer] Interactive Patient Education  2017 [employer]  

## 2018-01-01 NOTE — Progress Notes (Signed)
[facility]   Provider:  Larey Seat, M D  Primary Care Physician:  Darcus Austin, MD   Referring Provider: Darcus Austin, MD    Chief Complaint  Patient presents with  . New Patient (Initial Visit)    pt with wife. pt states ankles and feet feel heavy. complains of tingling from knee down. pt states when he gets up out of the bed . he states through the day intermittently. RA diagnoisis a few months ago and they felt that had nothing to do with what he is feeling now. pt states he has numbness/heaviness and tingling everyday but again intermittent. states this has been going on for a while but he feels its worsening.     HPI:  [name] is a 82 y.o. male patient, seen here as a referral from Dr. Inda Merlin for neuropathy work up.   I had seen [name] including gait exam in 2014- 15, and since his gait has deteriorated. He rides an exercise bike, he sleeps well, but his feet are numb, heavy and have pin and needle dysesthesias. He feels his feet are heavy - "as if they are bricks". He walks with a wide based gait and a cane. He has to be careful when walking not to fall, had one fall  " in slow motion" as if gliding to the floor in 12/ 2018. Mr. [name] has also noted that walking on uneven ground is very difficult for him may it be on grass, pebbles or sand. He does not feel that his legs are restless but he has to have an irresistible urge to move. He also does not feel that it interrupts his sleep as it is not a painful condition.  Chief complaint according to patient : " Heavy feet, and a bit of tension headaches" .   Medical history : Mr. 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  He had stopped statins in order to improve  his leg strength, but now walks better on methotraxate.  Social history:  Married, originally from [place], non smoker now, non ETOH,   Review of Systems: Out of a complete 14 system review, the patient complains of only the following symptoms, and all other reviewed systems are negative.  Low back pain, wider based gait, fall risk. Feet are numb.     STUDY DATE: 09/22/13 PATIENT NAME: [name] DOB: Jan 29, 1936 MRN: [id number]  ORDERING CLINICIAN: Larey Seat, MD  CLINICAL HISTORY: 82 year old male with headaches and gait disorder.  EXAM: MRI cervical spine (without)  TECHNIQUE: MRI of the cervical spine was obtained utilizing 3 mm sagittal slices from the posterior fossa down to the T3-4 level with T1, T2 and inversion recovery views. In addition 4 mm axial slices from B0-1 down to T1-2 level were included with T2 and gradient echo views. CONTRAST: no IMAGING SITE: [employer] [facility] (3 Tesla MRI)   FINDINGS:  On sagittal views the vertebral bodies have normal height and alignment. Disc bulging and spondylosis at C3-4, C5-6, C6-7. Mild anterior subluxations of C4 on C5 (23mm) and C5 on C6 (30mm). The spinal cord is normal in size and appearance. The posterior fossa, pituitary gland and paraspinal soft tissues are unremarkable.    On axial views: C2-3: uncovertebral joint hypertrophy and facet  hypertrophy with moderate left foraminal stenosis C3-4: disc bulging and uncovertebral joint hypertrophy and facet hypertrophy with mlid biforaminal foraminal stenosis C4-5: uncovertebral joint hypertrophy and facet hypertrophy with moderate right and mild left foraminal stenosis C5-6: uncovertebral joint hypertrophy and facet hypertrophy with no spinal stenosis or foraminal narrowing C6-7: disc bulging and uncovertebral joint hypertrophy with mild spinal stenosis and moderate biforaminal foraminal stenosis; no cord signal changes C7-T1: no spinal stenosis or foraminal  narrowing T1-2: no spinal stenosis or foraminal narrowing  Limited views of the soft tissues of the head and neck are unremarkable.   IMPRESSION:  Abnormal MRI cervical spine (without) demonstrating: 1. Multi-level degenerative spine disease. Disc bulging and spondylosis at C3-4, C5-6, C6-7. Mild anterior subluxations of C4 on C5 (39mm) and C5 on C6 (66mm). This results in multi-level foraminal stenoses as noted above. Most notably affected level is at C6-7 with mild spinal stenosis and moderate biforaminal foraminal stenosis; no cord signal changes. 2. No intrinsic spinal cord lesions.    INTERPRETING PHYSICIAN:  Penni Bombard, MD Certified in Neurology, Neurophysiology and Neuroimaging  Social History   Socioeconomic History  . Marital status: Married    Spouse name: [name]  . Number of children: 3  . Years of education: 1  . Highest education level: Not on file  Occupational History  . Occupation: Retired  Scientific laboratory technician  . Financial resource strain: Not on file  . Food insecurity:    Worry: Not on file    Inability: Not on file  . Transportation needs:    Medical: Not on file    Non-medical: Not on file  Tobacco Use  . Smoking status: Former Smoker    Packs/day: 0.50    Years: 60.00    Pack years: 30.00    Types: Cigarettes    Last attempt to quit: 04/07/2014    Years since quitting: 3.7  . Smokeless tobacco: Never Used  Substance and Sexual Activity  . Alcohol use: Yes    Alcohol/week: 8.4 oz    Types: 14 Standard drinks or equivalent per week    Comment: 1-2 before dinner  . Drug use: No  . Sexual activity: Not on file  Lifestyle  . Physical activity:    Days per week: Not on file    Minutes per session: Not on file  . Stress: Not on file  Relationships  . Social connections:    Talks on phone: Not on file    Gets together: Not on file    Attends religious service: Not on file    Active member of club or organization: Not on file    Attends  meetings of clubs or organizations: Not on file    Relationship status: Not on file  . Intimate partner violence:    Fear of current or ex partner: Not on file    Emotionally abused: Not on file    Physically abused: Not on file    Forced sexual activity: Not on file  Other Topics Concern  . Not on file  Social History Narrative   Patient is married [name]).   Patient has three children and 7 grandchildren.   Patient is retired.   Patient drinks one serving of coffee daily.   Patient is right-handed.   Patient has a college education.    Family History  Problem Relation Age of Onset  . Stroke Father     Past Medical History:  Diagnosis Date  . Asthma   . Bronchitis   .  COPD (chronic obstructive pulmonary disease) ([place])   . Degenerative cervical disc   . Diabetes mellitus   . Hematoma    left subdoral  . Hypercholesterolemia   . Hypertension   . Iliac artery aneurysm ([place])   . Lung nodule   . Macular degeneration   . Peripheral neuropathy   . Progressive gait disorder   . 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 Skull fracture ([place])   . Subdural hematoma ([facility])     No past surgical history on file.  Current Outpatient Medications  Medication Sig Dispense Refill  . albuterol (PROVENTIL HFA;VENTOLIN HFA) 108 (90 BASE) MCG/ACT inhaler Inhale 2 puffs into the lungs every 6 (six) hours as needed for wheezing or shortness of breath.    . Ascorbic Acid (VITAMIN C PO) Take 1 tablet by mouth daily.    Marland Kitchen aspirin 81 MG tablet Take 81 mg by mouth daily.    . cetirizine (ZYRTEC) 10 MG tablet Take 10 mg by mouth daily.    . famotidine (PEPCID) 20 MG tablet One at bedtime    . finasteride (PROSCAR) 5 MG tablet Take 5 mg by mouth daily.    . folic acid (FOLVITE) 1 MG tablet Take 1 mg by mouth daily.    Marland Kitchen glipiZIDE (GLUCOTROL XL) 5 MG 24 hr tablet Take 5 mg by mouth daily with breakfast. x30 days per PCP    . LOSARTAN POTASSIUM PO Take 100 mg by mouth daily.    . metFORMIN  (GLUCOPHAGE) 500 MG tablet Take 1 tablet (500 mg total) by mouth 2 (two) times daily with a meal. Resume tonight.    . methotrexate 2.5 MG tablet Take 2.5 mg by mouth once a week. Take 6 tablets orally once a week    . Multiple Vitamin (MULTIVITAMIN WITH MINERALS) TABS Take 1 tablet by mouth daily.    Marland Kitchen omeprazole (PRILOSEC) 40 MG capsule Take 40 mg by mouth daily.    . ONE TOUCH ULTRA TEST test strip     . ONETOUCH DELICA LANCETS 37J MISC     . pantoprazole (PROTONIX) 40 MG tablet Take 1 tablet (40 mg total) by mouth daily. Take 30-60 min before first meal of the day 30 tablet 2  . PREDNISONE PO Take 7.5 mg by mouth daily with breakfast. Take 3-4 pills once a day     . sildenafil (VIAGRA) 50 MG tablet Take 50 mg by mouth daily as needed for erectile dysfunction.    . SYMBICORT 160-4.5 MCG/ACT inhaler INHALE 2 PUFFS INTO THE LUNGS 2 TIMES DAILY 1 Inhaler 11  . Tamsulosin HCl (FLOMAX) 0.4 MG CAPS Take 0.4 mg by mouth daily.    . Tiotropium Bromide Monohydrate (SPIRIVA RESPIMAT) 1.25 MCG/ACT AERS Inhale 2 puffs into the lungs daily. 1 Inhaler 0  . Tiotropium Bromide Monohydrate (SPIRIVA RESPIMAT) 1.25 MCG/ACT AERS Inhale 2 puffs into the lungs 2 (two) times daily. 1 Inhaler 11   No current facility-administered medications for this visit.     Allergies as of 01/01/2018 - Review Complete 01/01/2018  Allergen Reaction Noted  . Lisinopril Cough 12/31/2017  . Pneumovax 23 [pneumococcal vac polyvalent]  09/10/2013  . Prednisone Other (See Comments) 08/03/2011  . Prevnar 13 [pneumococcal 13-val conj vacc] Swelling 09/13/2014  . Penicillins Rash 08/03/2011  . Sulfa antibiotics Rash 08/03/2011    Vitals: BP 123/83   Pulse 98   Ht 5\' 10"  (1.778 m)   Wt 213 lb (96.6 kg)   BMI 30.56 kg/m  Last Weight:  Wt Readings from Last 1 Encounters:  01/01/18 213 lb (96.6 kg)   ZOX:WRUE mass index is 30.56 kg/m.     Last Height:   Ht Readings from Last 1 Encounters:  01/01/18 5\' 10"  (1.778 m)     Physical exam:  General: The patient is awake, alert and appears not in acute distress. The patient is well groomed. Head: Normocephalic, atraumatic. Neck is supple.   Cardiovascular:  Regular rate and rhythm without  murmurs or carotid bruit, and without distended neck veins. Respiratory: Lungs are clear to auscultation. Skin:  Without evidence of edema, or rash Trunk: The patient's posture is not stooped !   Neurologic exam : The patient is awake and alert, oriented to place and time.      Attention span & concentration ability appears normal.  Speech is fluent,  without dysarthria, dysphonia or aphasia.  Mood and affect are appropriate.  Cranial nerves: Pupils are equal and briskly reactive to light. Funduscopic exam deferred. Extraocular movements  in vertical and horizontal planes intact and without nystagmus. Visual fields by finger perimetry are intact. Hearing to finger rub intact.  Facial sensation intact to fine touch. Facial motor strength is symmetric and tongue and uvula move midline. Shoulder shrug was symmetrical.   Motor exam: Normal tone, muscle bulk and symmetric strength in all extremities. He has weakness of his flexion, knee flexion and ankle plantar-flection. Sensory:  Fine touch, pinprick and vibration were tested in all extremities. Proprioception tested in the upper extremities was normal. Coordination: Rapid alternating movements in the fingers/hands was normal. Finger-to-nose maneuver  normal without evidence of ataxia, dysmetria or tremor. Gait and station: Patient walks without assistive device and is able unassisted to climb up to the exam table. Strength within normal limits.  Stance is wide based -  Toe and heel stand were tested, he is weak on his toes. Tandem gait deferred- Turns with 3 steps to the left and 4 steps to the right. Romberg testing is positive , propulsive. . Deep tendon reflexes: in the upper and lower extremities are symmetric and  intact. Babinski maneuver response is downgoing.  Assessment:  After physical and neurologic examination, review of laboratory studies,  Personal review of imaging studies, reports of other /same  Imaging studies, results of polysomnography and / or neurophysiology testing and pre-existing records as far as provided in visit., my assessment is   The patient was advised of the nature of the diagnosed disorder , the treatment options and the  risks for general health and wellness arising from not treating the condition.   I spent more than 55  minutes of face to face time with the patient.  Greater than 50% of time was spent in counseling and coordination of care. We have discussed the diagnosis and differential and I answered the patient's questions.    Plan:  Treatment plan and additional workup :  Neuropathy-   Prevent progression by taking Vit B 12 , Vit D, and keep moving- stationary bike is fine. Use the cane.    Rheumatoid arthritis is a likely explanation , rule out with lower spinal stenosis. MRI  Diabetic neuropathy explains the numbness, but not the muscular weakness, NCV and EMG .   Larey Seat, MD 4/54/0981, 1:91 PM  Certified in Neurology by ABPN Certified in [place] by [facility] [address] [place], [place] [address]

## 2018-01-05 DIAGNOSIS — R05 Cough: Secondary | ICD-10-CM | POA: Diagnosis not present

## 2018-01-05 DIAGNOSIS — J189 Pneumonia, unspecified organism: Secondary | ICD-10-CM | POA: Diagnosis not present

## 2018-01-05 DIAGNOSIS — M6281 Muscle weakness (generalized): Secondary | ICD-10-CM | POA: Diagnosis not present

## 2018-01-05 DIAGNOSIS — M069 Rheumatoid arthritis, unspecified: Secondary | ICD-10-CM | POA: Diagnosis not present

## 2018-01-05 DIAGNOSIS — M1990 Unspecified osteoarthritis, unspecified site: Secondary | ICD-10-CM | POA: Diagnosis not present

## 2018-01-05 DIAGNOSIS — Z79899 Other long term (current) drug therapy: Secondary | ICD-10-CM | POA: Diagnosis not present

## 2018-01-07 DIAGNOSIS — J189 Pneumonia, unspecified organism: Secondary | ICD-10-CM | POA: Diagnosis not present

## 2018-01-12 DIAGNOSIS — J449 Chronic obstructive pulmonary disease, unspecified: Secondary | ICD-10-CM | POA: Diagnosis not present

## 2018-01-12 DIAGNOSIS — J180 Bronchopneumonia, unspecified organism: Secondary | ICD-10-CM | POA: Diagnosis not present

## 2018-01-14 ENCOUNTER — Encounter: Payer: PPO | Admitting: Neurology

## 2018-01-22 DIAGNOSIS — M79674 Pain in right toe(s): Secondary | ICD-10-CM | POA: Diagnosis not present

## 2018-01-22 DIAGNOSIS — M2041 Other hammer toe(s) (acquired), right foot: Secondary | ICD-10-CM | POA: Diagnosis not present

## 2018-01-22 DIAGNOSIS — L03031 Cellulitis of right toe: Secondary | ICD-10-CM | POA: Diagnosis not present

## 2018-01-22 DIAGNOSIS — E139 Other specified diabetes mellitus without complications: Secondary | ICD-10-CM | POA: Diagnosis not present

## 2018-01-22 DIAGNOSIS — M2042 Other hammer toe(s) (acquired), left foot: Secondary | ICD-10-CM | POA: Diagnosis not present

## 2018-02-25 ENCOUNTER — Ambulatory Visit (INDEPENDENT_AMBULATORY_CARE_PROVIDER_SITE_OTHER): Payer: PPO | Admitting: Neurology

## 2018-02-25 ENCOUNTER — Encounter: Payer: Self-pay | Admitting: Neurology

## 2018-02-25 DIAGNOSIS — E1142 Type 2 diabetes mellitus with diabetic polyneuropathy: Secondary | ICD-10-CM | POA: Insufficient documentation

## 2018-02-25 DIAGNOSIS — G63 Polyneuropathy in diseases classified elsewhere: Secondary | ICD-10-CM

## 2018-02-25 DIAGNOSIS — I723 Aneurysm of iliac artery: Secondary | ICD-10-CM

## 2018-02-25 DIAGNOSIS — M4807 Spinal stenosis, lumbosacral region: Secondary | ICD-10-CM | POA: Diagnosis not present

## 2018-02-25 DIAGNOSIS — R269 Unspecified abnormalities of gait and mobility: Secondary | ICD-10-CM

## 2018-02-25 DIAGNOSIS — R2689 Other abnormalities of gait and mobility: Secondary | ICD-10-CM

## 2018-02-25 DIAGNOSIS — E349 Endocrine disorder, unspecified: Secondary | ICD-10-CM

## 2018-02-25 HISTORY — DX: Type 2 diabetes mellitus with diabetic polyneuropathy: E11.42

## 2018-02-25 NOTE — Progress Notes (Signed)
[facility]    Nerve / Sites Muscle Latency Ref. Amplitude Ref. Rel Amp Segments Distance Velocity Ref. Area    ms ms mV mV %  cm m/s m/s mVms  R Ulnar - ADM     Wrist ADM 3.4 ?3.3 5.8 ?6.0 100 Wrist - ADM 7   12.5     B.Elbow ADM 8.1  4.7  80.8 B.Elbow - Wrist 20 42 ?49 11.6     A.Elbow ADM 10.3  4.4  92.7 A.Elbow - B.Elbow 10 46 ?49 12.2         A.Elbow - Wrist      R Peroneal - EDB     Ankle EDB NR ?6.5 NR ?2.0 NR Ankle - EDB 9   NR     Fib head EDB NR  NR  NR Fib head - Ankle 33 NR ?44 NR     Pop fossa EDB NR  NR  NR Pop fossa - Fib head 10 NR ?44 NR         Pop fossa - Ankle      L Peroneal - EDB     Ankle EDB NR ?6.5 NR ?2.0 NR Ankle - EDB 9   NR     Fib head EDB NR  NR  NR Fib head - Ankle 33 NR ?44 NR     Pop fossa EDB NR  NR  NR Pop fossa - Fib head 10 NR ?44 NR         Pop fossa - Ankle      R Tibial - AH     Ankle AH 5.2 ?5.8 0.9 ?4.0 100 Ankle - AH 9   2.5     Pop fossa AH 17.9  0.7  76.7 Pop fossa - Ankle 45 35 ?41 1.6  L Tibial - AH     Ankle AH 5.9 ?5.8 0.9 ?4.0 100 Ankle - AH 9   1.9     Pop fossa AH 17.7  0.6  66 Pop fossa - Ankle 45 38 ?41 1.0               SNC    Nerve / Sites Rec. Site Peak Lat Ref.  Amp Ref. Segments Distance    ms ms V V  cm  R Radial - Anatomical snuff box (Forearm)     Forearm Wrist 3.1 ?2.9 8 ?15 Forearm - Wrist 10  R Sural - Ankle (Calf)     Calf Ankle NR ?4.4 NR ?6 Calf - Ankle 14  L Sural - Ankle (Calf)     Calf Ankle NR ?4.4 NR ?6 Calf - Ankle 14  R Superficial peroneal - Ankle     Lat leg Ankle NR ?4.4 NR ?6 Lat leg - Ankle 14  L Superficial peroneal - Ankle     Lat leg Ankle NR ?4.4 NR ?6 Lat leg - Ankle 14  R Ulnar - Orthodromic, (Dig V, Mid palm)     Dig V Wrist 3.3 ?3.1 4 ?5 Dig V - Wrist 90                 F  Wave    Nerve F Lat Ref.   ms ms  R Tibial - AH 70.3 ?56.0  L Tibial - AH 66.7 ?56.0  R Ulnar - ADM 33.6 ?32.0

## 2018-02-25 NOTE — Procedures (Signed)
     HISTORY:  [name] is a 82 year old gentleman with a 7-year history of some numbness in the feet and some mild gait instability.  He reports no back pain or pain down the legs.  He is being evaluated for a possible peripheral neuropathy or a lumbosacral radiculopathy.  NERVE CONDUCTION STUDIES:  Nerve conduction studies were performed on the right upper extremity.  The distal motor latency for the right ulnar nerve was slightly prolonged with a low motor amplitude.  Slowing was seen above and below the elbow for this nerve.  The right radial and right ulnar sensory latencies were prolonged with prolongation of the right ulnar F-wave latency.  Nerve conduction studies were performed on both lower extremities.  No response was seen to the peroneal nerves bilaterally.  The distal motor latencies for the posterior tibial nerves were prolonged on the left, normal on the right with low motor amplitudes for these nerves bilaterally.  Slowing was seen for the posterior tibial nerves bilaterally.  No response was seen for the sural or peroneal sensory latencies bilaterally.  Prolongation of the F-wave latencies for the posterior tibial nerves were noted bilaterally.  EMG STUDIES:  EMG study was performed on the right lower extremity:  The tibialis anterior muscle reveals 2 to 4K motor units with slightly reduced recruitment. No fibrillations or positive waves were seen. The peroneus tertius muscle reveals 2 to 4K motor units with slightly reduced recruitment. No fibrillations or positive waves were seen. The medial gastrocnemius muscle reveals 1 to 3K motor units with slightly reduced recruitment. No fibrillations or positive waves were seen. The vastus lateralis muscle reveals 2 to 4K motor units with full recruitment. No fibrillations or positive waves were seen. The iliopsoas muscle reveals 2 to 4K motor units with full recruitment. No fibrillations or positive waves were seen. The biceps  femoris muscle (long head) reveals 2 to 4K motor units with full recruitment. No fibrillations or positive waves were seen. The lumbosacral paraspinal muscles were tested at 3 levels, and revealed no abnormalities of insertional activity at all 3 levels tested. There was good relaxation.   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  EMG evaluation of the right lower extremity shows mild chronic stable distal signs of denervation consistent with a diagnosis of peripheral neuropathy.  No evidence of an overlying lumbosacral radiculopathy was seen.  [name] MD 02/25/2018 1:47 PM  [facility] [address] [place], [place] [address]  Phone [phone] Fax [phone]

## 2018-02-25 NOTE — Progress Notes (Signed)
Please refer to EMG and nerve conduction study procedure note. 

## 2018-02-26 ENCOUNTER — Telehealth: Payer: Self-pay | Admitting: Neurology

## 2018-02-26 NOTE — Telephone Encounter (Signed)
-----   Message from Larey Seat, MD sent at 02/25/2018  6:16 PM EDT ----- Most likely origin for this neuropathy is an endocrine disorder, vitamin deficiency or abnormal plasmaprotein.

## 2018-02-26 NOTE — Telephone Encounter (Signed)
Called and reviewed the results with the pt and made him aware there was findings of peripheral neuropathy. Informed him that these Dr Brett Fairy.  Reminded the patient of his upcoming apt in Oct and that we would review these in more detail with him at that apt as well as if there is a treatment plan. Pt verbalized understanding.

## 2018-03-09 DIAGNOSIS — M069 Rheumatoid arthritis, unspecified: Secondary | ICD-10-CM | POA: Diagnosis not present

## 2018-03-09 DIAGNOSIS — M1990 Unspecified osteoarthritis, unspecified site: Secondary | ICD-10-CM | POA: Diagnosis not present

## 2018-03-09 DIAGNOSIS — M6281 Muscle weakness (generalized): Secondary | ICD-10-CM | POA: Diagnosis not present

## 2018-03-09 DIAGNOSIS — Z79899 Other long term (current) drug therapy: Secondary | ICD-10-CM | POA: Diagnosis not present

## 2018-03-27 DIAGNOSIS — Z23 Encounter for immunization: Secondary | ICD-10-CM | POA: Diagnosis not present

## 2018-03-30 ENCOUNTER — Ambulatory Visit: Payer: PPO | Admitting: Family

## 2018-03-31 ENCOUNTER — Ambulatory Visit: Payer: PPO | Admitting: Family

## 2018-03-31 ENCOUNTER — Encounter: Payer: Self-pay | Admitting: Family

## 2018-03-31 ENCOUNTER — Other Ambulatory Visit: Payer: Self-pay

## 2018-03-31 VITALS — BP 120/76 | HR 74 | Temp 97.6°F | Resp 18 | Ht 70.0 in | Wt 210.0 lb

## 2018-03-31 DIAGNOSIS — I723 Aneurysm of iliac artery: Secondary | ICD-10-CM | POA: Diagnosis not present

## 2018-03-31 DIAGNOSIS — I728 Aneurysm of other specified arteries: Secondary | ICD-10-CM

## 2018-03-31 NOTE — Progress Notes (Signed)
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 (May 13, 1936) male whom Dr. 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  He has been having a CTA abd/pelvis every 2 years to monitor this.   He has had no abdominal or back symptoms. This was found incidentally during evaluation of hematuria. He does have some right hip problems; he was evaluated by an orthopod, pt states his hip is fine, states he was told that he has some arthritis in his lower back causing the mild intermittent right hip pain.  Dr. Kellie Simmering last evaluated pt on 03/29/14. At that time CT angiogram of the abdomen and pelvis demonstrated right common iliac artery aneurysm as unchanged at 2.6 cm. The area of slight dilatation in the celiac axis was 14 mm and unchanged.  He returns for provider evaluation prior to CTA abd/pelvis to evaluate right CIA and celiac artery aneurysms.  The patient deniesclaudication type symptoms in his legs with walking. His walking seems limited by arthritis pain.  The patient denieshistory of stroke or TIA symptoms.  He has developed RA and OA, also neuropathy in his lower legs.    Diabetic: Yes, states his last A1C was 7.2, states he was taking prednisone for RA which increased his blood sugar Tobacco use: former smoker, quit in September 2015, smoked x 60 years   Past Medical History:  Diagnosis Date  . Asthma   . Bronchitis   . COPD (chronic obstructive pulmonary disease) ([place])   . Degenerative cervical disc   . Diabetes mellitus   . Diabetic peripheral neuropathy ([place]) 02/25/2018  . Hematoma    left subdoral  . Hypercholesterolemia   . Hypertension   . Iliac artery aneurysm ([place])   . Lung nodule   . Macular degeneration   . Peripheral neuropathy    . Progressive gait disorder   . 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 Skull fracture ([place])   . Subdural hematoma ([facility])    History reviewed. No pertinent surgical history. Social History Social History   Socioeconomic History  . Marital status: Married    Spouse name: [name]  . Number of children: 3  . Years of education: 66  . Highest education level: Not on file  Occupational History  . Occupation: Retired  Scientific laboratory technician  . Financial resource strain: Not on file  . Food insecurity:    Worry: Not on file    Inability: Not on file  . Transportation needs:    Medical: Not on file    Non-medical: Not on file  Tobacco Use  . Smoking status: Former Smoker    Packs/day: 0.50    Years: 60.00    Pack years: 30.00    Types: Cigarettes    Last attempt to quit: 04/07/2014    Years since quitting: 3.9  . Smokeless tobacco: Never Used  Substance and Sexual Activity  . Alcohol use: Yes    Alcohol/week: 14.0 standard drinks    Types: 14 Standard drinks or equivalent per week    Comment: 1-2 before dinner  . Drug use: No  . Sexual activity: Not on file  Lifestyle  . Physical activity:    Days per week: Not on file    Minutes per session: Not on file  . Stress: Not  on file  Relationships  . Social connections:    Talks on phone: Not on file    Gets together: Not on file    Attends religious service: Not on file    Active member of club or organization: Not on file    Attends meetings of clubs or organizations: Not on file    Relationship status: Not on file  . Intimate partner violence:    Fear of current or ex partner: Not on file    Emotionally abused: Not on file    Physically abused: Not on file    Forced sexual activity: Not on file  Other Topics Concern  . Not on file  Social History Narrative   Patient is married [name]).   Patient has three children and 7 grandchildren.   Patient is retired.   Patient drinks one serving of coffee daily.   Patient is right-handed.    Patient has a college education.   Family History Family History  Problem Relation Age of Onset  . Stroke Father     Current Outpatient Medications on File Prior to Visit  Medication Sig Dispense Refill  . albuterol (PROVENTIL HFA;VENTOLIN HFA) 108 (90 BASE) MCG/ACT inhaler Inhale 2 puffs into the lungs every 6 (six) hours as needed for wheezing or shortness of breath.    . Ascorbic Acid (VITAMIN C PO) Take 1 tablet by mouth daily.    Marland Kitchen aspirin 81 MG tablet Take 81 mg by mouth daily.    . cetirizine (ZYRTEC) 10 MG tablet Take 10 mg by mouth daily.    . famotidine (PEPCID) 20 MG tablet One at bedtime    . finasteride (PROSCAR) 5 MG tablet Take 5 mg by mouth daily.    . folic acid (FOLVITE) 1 MG tablet Take 1 mg by mouth daily.    Marland Kitchen glipiZIDE (GLUCOTROL XL) 5 MG 24 hr tablet Take 5 mg by mouth daily with breakfast. x30 days per PCP    . LOSARTAN POTASSIUM PO Take 100 mg by mouth daily.    . metFORMIN (GLUCOPHAGE) 500 MG tablet Take 1 tablet (500 mg total) by mouth 2 (two) times daily with a meal. Resume tonight.    . methotrexate 2.5 MG tablet Take 2.5 mg by mouth once a week. Take 6 tablets orally once a week    . Multiple Vitamin (MULTIVITAMIN WITH MINERALS) TABS Take 1 tablet by mouth daily.    Marland Kitchen omeprazole (PRILOSEC) 40 MG capsule Take 40 mg by mouth daily.    . ONE TOUCH ULTRA TEST test strip     . ONETOUCH DELICA LANCETS 96V MISC     . pantoprazole (PROTONIX) 40 MG tablet Take 1 tablet (40 mg total) by mouth daily. Take 30-60 min before first meal of the day 30 tablet 2  . PREDNISONE PO Take 7.5 mg by mouth daily with breakfast. Take 3-4 pills once a day     . sildenafil (VIAGRA) 50 MG tablet Take 50 mg by mouth daily as needed for erectile dysfunction.    . SYMBICORT 160-4.5 MCG/ACT inhaler INHALE 2 PUFFS INTO THE LUNGS 2 TIMES DAILY 1 Inhaler 11  . Tamsulosin HCl (FLOMAX) 0.4 MG CAPS Take 0.4 mg by mouth daily.    . Tiotropium Bromide Monohydrate (SPIRIVA RESPIMAT) 1.25 MCG/ACT  AERS Inhale 2 puffs into the lungs daily. 1 Inhaler 0  . Tiotropium Bromide Monohydrate (SPIRIVA RESPIMAT) 1.25 MCG/ACT AERS Inhale 2 puffs into the lungs 2 (two) times daily. 1 Inhaler 11  No current facility-administered medications on file prior to visit.    Allergies  Allergen Reactions  . Lisinopril Cough  . Pneumovax 23 [Pneumococcal Vac Polyvalent]     Fever, sweats  . Prednisone Other (See Comments)    Sweating with high dose  . Prevnar 13 [Pneumococcal 13-Val Conj Vacc] Swelling  . Penicillins Rash  . Sulfa Antibiotics Rash    ROS: See HPI for pertinent positives and negatives.  Physical Examination  Vitals:   03/31/18 1541  BP: 120/76  Pulse: 74  Resp: 18  Temp: 97.6 F (36.4 C)  TempSrc: Oral  SpO2: 92%  Weight: 210 lb (95.3 kg)  Height: 5\' 10"  (1.778 m)   Body mass index is 30.13 kg/m.  General: A&O x 3, WD, obese male accompanied by his wife. Gait: Using cane, steady HEENT: PERRLA, no gross abnormalities  Pulmonary: Rspirations are non labored, CTAB, good air movement in all fields.  Cardiac: Regular rthythm and rate, no detected murmur.    Carotid Bruits Right Left   Negative Negative   Abdominal aortic pulse is notpalpable Radial pulses are 2+ palpable  VASCULAR EXAM:  LE Pulses Right Left  FEMORAL 2+palpable  2+palpable  POPLITEAL 2+palpable 1+palpable  POSTERIOR TIBIAL 2+palpable 2+palpable  DORSALIS PEDIS ANTERIOR TIBIAL 2+palpable 2+palpable   Gastrointestinal: soft, NTND, -G/R, - HSM, - masses palpated, - CVAT B. Musculoskeletal: M/S 5/5 throughout, Extremities without ischemic changes. Moderate arthritic deformities in hands.  Neurologic: CN 2-12 intact, Pain and light touch intact in extremities are intact, Motor exam as listed above. Skin: No rashes, no ulcers, no cellulitis.   Psychiatric: Normal thought content, mood appropriate to clinical situation.      Medical Decision Making  The patient is a 82 y.o. 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 His blood pressure is in good control and he has not used tobacco since September of 2015.   He has no abdominal pain, no back pain.   CTA abd/pelvis will be scheduled in about 2-6 weeks, pt will follow up with me afterward to discuss results. If the aneurysms remain statble, he will follow up in 2 years to see me, then repeat CTA abd/pelvis.  His CTA from October 2017 showed the right CIA at 2.7 cm, and the celiac artery at 15 mm.  This was no significant change from the CTA abd/pelvis on 03/29/14.    Consideration for repair of common iliac artery aneurysm would be at 3.0 cm or greater.     I emphasized the importance of maximal medical management including strict control of blood pressure, blood glucose, and lipid levels, antiplatelet agents, obtaining regular exercise, and continued cessation of smoking.   The patient was advised to call 911 should the patient experience sudden onset abdominal or back pain.   Thank you for allowing [place] to participate in this patient's care.  Clemon Chambers, RN, MSN, FNP-C [facility] [place] Office: [phone]  Clinic Physician: Bishop Dublin  03/31/2018, 4:19 PM

## 2018-04-01 ENCOUNTER — Other Ambulatory Visit: Payer: Self-pay

## 2018-04-01 DIAGNOSIS — I728 Aneurysm of other specified arteries: Secondary | ICD-10-CM

## 2018-04-01 DIAGNOSIS — I723 Aneurysm of iliac artery: Secondary | ICD-10-CM

## 2018-04-06 DIAGNOSIS — M1811 Unilateral primary osteoarthritis of first carpometacarpal joint, right hand: Secondary | ICD-10-CM | POA: Diagnosis not present

## 2018-04-06 DIAGNOSIS — M1812 Unilateral primary osteoarthritis of first carpometacarpal joint, left hand: Secondary | ICD-10-CM | POA: Diagnosis not present

## 2018-04-14 ENCOUNTER — Ambulatory Visit (INDEPENDENT_AMBULATORY_CARE_PROVIDER_SITE_OTHER): Payer: PPO | Admitting: Neurology

## 2018-04-14 ENCOUNTER — Encounter: Payer: Self-pay | Admitting: Neurology

## 2018-04-14 ENCOUNTER — Other Ambulatory Visit: Payer: PPO

## 2018-04-14 VITALS — BP 135/76 | HR 90 | Ht 68.0 in | Wt 213.0 lb

## 2018-04-14 DIAGNOSIS — G44209 Tension-type headache, unspecified, not intractable: Secondary | ICD-10-CM | POA: Diagnosis not present

## 2018-04-14 DIAGNOSIS — R269 Unspecified abnormalities of gait and mobility: Secondary | ICD-10-CM

## 2018-04-14 DIAGNOSIS — G63 Polyneuropathy in diseases classified elsewhere: Secondary | ICD-10-CM | POA: Diagnosis not present

## 2018-04-14 DIAGNOSIS — G629 Polyneuropathy, unspecified: Secondary | ICD-10-CM | POA: Diagnosis not present

## 2018-04-14 DIAGNOSIS — E349 Endocrine disorder, unspecified: Secondary | ICD-10-CM

## 2018-04-14 DIAGNOSIS — R2689 Other abnormalities of gait and mobility: Secondary | ICD-10-CM | POA: Diagnosis not present

## 2018-04-14 NOTE — Progress Notes (Signed)
[facility]   Provider:  Larey Seat, M D  Primary Care Physician:  Darcus Austin, MD   Referring Provider: Darcus Austin, MD    Chief Complaint  Patient presents with  . Follow-up    RM 11, with wife. pt's neuropathy is really bothering him. pt denies any falls.    HPI:  [name] is a 82 y.o. male patient on 04-14-2018 , patient doing better on steroid taper and methotrexate - doing well.  He is still bothered by his neuropathy , the feeling of waking on pebbles, tingling, and yet numb. His feet feel heavy.  Dr Jannifer Maldonado did examine him by NCV and EMG.  Notes recorded by Larey Seat, MD on 02/25/2018 at 6:16 PM EDT Most likely origin for this neuropathy is an endocrine disorder, vitamin deficiency or abnormal plasmaprotein. ------  Notes recorded by Norah Devin, Asencion Partridge, MD on 02/25/2018 at 6:10 PM EDT [name] is a 82 year old gentleman with a 7-year history of some numbness in the feet and some mild gait instability. He reports no back pain or pain down the legs. He is treated on steroids and has developed higher blood glucose levels.   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 EMG evaluation of the right lower extremity shows mild chronic stable distal signs of denervation consistent with a diagnosis of peripheral neuropathy. No evidence of an overlying lumbosacral radiculopathy was seen.  James Alexanders MD 02/25/2018 1:47 PM    He was  seen here as a referral from Dr. Inda Maldonado for neuropathy work up. I had seen [name] including gait exam in 2014- 15, and since his gait has deteriorated. He rides an exercise bike, he sleeps well, but his feet are numb, heavy and have pin and needle dysesthesias. He feels his feet are heavy - "as if they are bricks". He walks with a wide based gait and a cane. He has to be careful  when walking not to fall, had one fall  " in slow motion" as if gliding to the floor in 12/ 2018. [name] has also noted that walking on uneven ground is very difficult for him may it be on grass, pebbles or sand. He does not feel that his legs are restless but he has to have an irresistible urge to move. He also does not feel that it interrupts his sleep as it is not a painful condition.  Chief complaint according to patient : " Heavy feet, and a bit of tension headaches" .   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  He had stopped statins in order to improve his leg strength, but now walks better on methotraxate.  Social history:  Married, originally from [place], non smoker now, non ETOH,   Review of Systems: Out of a complete 14 system review, the patient complains of only the following symptoms, and all other reviewed systems are negative.  Low back pain, wider based gait, fall risk. Feet are numb.     STUDY DATE: 09/22/13 PATIENT NAME: [name] DOB: [birth year] MRN: [id number]  ORDERING CLINICIAN: Larey Seat, MD  CLINICAL HISTORY: 82 year old male with headaches and gait disorder.  EXAM: MRI cervical spine (without)  TECHNIQUE: MRI of the cervical spine was obtained utilizing 3 mm sagittal slices from the posterior fossa down to the T3-4 level with T1, T2 and inversion recovery views. In addition 4 mm axial slices from H0-8 down to T1-2 level were included with T2 and gradient echo views. CONTRAST: no IMAGING SITE: [employer] (3 Tesla MRI)   FINDINGS:  On sagittal views the vertebral bodies have normal height and alignment. Disc bulging and spondylosis at C3-4, C5-6, C6-7. Mild  anterior subluxations of C4 on C5 (81mm) and C5 on C6 (73mm). The spinal cord is normal in size and appearance. The posterior fossa, pituitary gland and paraspinal soft tissues are unremarkable.    On axial views: C2-3: uncovertebral joint hypertrophy and facet hypertrophy with moderate left foraminal stenosis C3-4: disc bulging and uncovertebral joint hypertrophy and facet hypertrophy with mlid biforaminal foraminal stenosis C4-5: uncovertebral joint hypertrophy and facet hypertrophy with moderate right and mild left foraminal stenosis C5-6: uncovertebral joint hypertrophy and facet hypertrophy with no spinal stenosis or foraminal narrowing C6-7: disc bulging and uncovertebral joint hypertrophy with mild spinal stenosis and moderate biforaminal foraminal stenosis; no cord signal changes C7-T1: no spinal stenosis or foraminal narrowing T1-2: no spinal stenosis or foraminal narrowing  Limited views of the soft tissues of the head and neck are unremarkable.   IMPRESSION:  Abnormal MRI cervical spine (without) demonstrating: 1. Multi-level degenerative spine disease. Disc bulging and spondylosis at C3-4, C5-6, C6-7. Mild anterior subluxations of C4 on C5 (66mm) and C5 on C6 (28mm). This results in multi-level foraminal stenoses as noted above. Most notably affected level is at C6-7 with mild spinal stenosis and moderate biforaminal foraminal stenosis; no cord signal changes. 2. No intrinsic spinal cord lesions.    INTERPRETING PHYSICIAN:  Penni Bombard, MD Certified in Neurology, Neurophysiology and Neuroimaging  Social History   Socioeconomic History  . Marital status: Married    Spouse name: [name]  . Number of children: 3  . Years of education: 69  . Highest education level: Not on file  Occupational History  . Occupation: Retired  Scientific laboratory technician  . Financial resource strain: Not on file  . Food insecurity:    Worry: Not on file    Inability: Not on file  . Transportation  needs:    Medical: Not on file    Non-medical: Not on file  Tobacco Use  . Smoking status: Former Smoker    Packs/day: 0.50    Years: 60.00    Pack years: 30.00    Types: Cigarettes    Last attempt to quit: 04/07/2014    Years since quitting: 4.0  . Smokeless tobacco: Never Used  Substance and Sexual Activity  . Alcohol use: Yes    Alcohol/week: 14.0 standard drinks    Types: 14 Standard drinks or equivalent per week    Comment: 1-2 before dinner  . Drug use: No  . Sexual activity: Not on file  Lifestyle  . Physical activity:    Days per week: Not on file    Minutes per session: Not on file  . Stress: Not on file  Relationships  . Social connections:    Talks on phone: Not on file    Gets together: Not on file    Attends religious service: Not on file    Active member of club or organization: Not on file    Attends meetings of clubs or organizations: Not on file  Relationship status: Not on file  . Intimate partner violence:    Fear of current or ex partner: Not on file    Emotionally abused: Not on file    Physically abused: Not on file    Forced sexual activity: Not on file  Other Topics Concern  . Not on file  Social History Narrative   Patient is married [name]).   Patient has three children and 7 grandchildren.   Patient is retired.   Patient drinks one serving of coffee daily.   Patient is right-handed.   Patient has a college education.    Family History  Problem Relation Age of Onset  . Stroke Father     Past Medical History:  Diagnosis Date  . Asthma   . Bronchitis   . COPD (chronic obstructive pulmonary disease) ([place])   . Degenerative cervical disc   . Diabetes mellitus   . Diabetic peripheral neuropathy ([place]) 02/25/2018  . Hematoma    left subdoral  . Hypercholesterolemia   . Hypertension   . Iliac artery aneurysm ([place])   . Lung nodule   . Macular degeneration   . Peripheral neuropathy   . Progressive gait disorder   . Progressive gait  disorder 09/15/2013  . Skull fracture ([place])   . Subdural hematoma ([facility])     No past surgical history on file.  Current Outpatient Medications  Medication Sig Dispense Refill  . albuterol (PROVENTIL HFA;VENTOLIN HFA) 108 (90 BASE) MCG/ACT inhaler Inhale 2 puffs into the lungs every 6 (six) hours as needed for wheezing or shortness of breath.    . Ascorbic Acid (VITAMIN C PO) Take 1 tablet by mouth daily.    Marland Kitchen aspirin 81 MG tablet Take 81 mg by mouth daily.    . cetirizine (ZYRTEC) 10 MG tablet Take 10 mg by mouth daily.    . famotidine (PEPCID) 20 MG tablet One at bedtime    . finasteride (PROSCAR) 5 MG tablet Take 5 mg by mouth daily.    . folic acid (FOLVITE) 1 MG tablet Take 1 mg by mouth daily.    Marland Kitchen glipiZIDE (GLUCOTROL XL) 5 MG 24 hr tablet Take 5 mg by mouth daily with breakfast. x30 days per PCP    . LOSARTAN POTASSIUM PO Take 100 mg by mouth daily.    . metFORMIN (GLUCOPHAGE) 500 MG tablet Take 1 tablet (500 mg total) by mouth 2 (two) times daily with a meal. Resume tonight.    . methotrexate 2.5 MG tablet Take 2.5 mg by mouth once a week. Take 6 tablets orally once a week    . Multiple Vitamin (MULTIVITAMIN WITH MINERALS) TABS Take 1 tablet by mouth daily.    Marland Kitchen omeprazole (PRILOSEC) 40 MG capsule Take 40 mg by mouth daily.    . ONE TOUCH ULTRA TEST test strip     . ONETOUCH DELICA LANCETS 30Q MISC     . pantoprazole (PROTONIX) 40 MG tablet Take 1 tablet (40 mg total) by mouth daily. Take 30-60 min before first meal of the day 30 tablet 2  . PREDNISONE PO Take 7.5 mg by mouth daily with breakfast. Take 3-4 pills once a day     . sildenafil (VIAGRA) 50 MG tablet Take 50 mg by mouth daily as needed for erectile dysfunction.    . SYMBICORT 160-4.5 MCG/ACT inhaler INHALE 2 PUFFS INTO THE LUNGS 2 TIMES DAILY 1 Inhaler 11  . Tamsulosin HCl (FLOMAX) 0.4 MG CAPS Take 0.4 mg by mouth daily.    Marland Kitchen  Tiotropium Bromide Monohydrate (SPIRIVA RESPIMAT) 1.25 MCG/ACT AERS Inhale 2 puffs into the lungs  daily. 1 Inhaler 0  . Tiotropium Bromide Monohydrate (SPIRIVA RESPIMAT) 1.25 MCG/ACT AERS Inhale 2 puffs into the lungs 2 (two) times daily. 1 Inhaler 11   No current facility-administered medications for this visit.     Allergies as of 04/14/2018 - Review Complete 04/14/2018  Allergen Reaction Noted  . Lisinopril Cough 12/31/2017  . Pneumovax 23 [pneumococcal vac polyvalent]  09/10/2013  . Prednisone Other (See Comments) 08/03/2011  . Prevnar 13 [pneumococcal 13-val conj vacc] Swelling 09/13/2014  . Penicillins Rash 08/03/2011  . Sulfa antibiotics Rash 08/03/2011    Vitals: BP 135/76   Pulse 90   Ht 5\' 8"  (1.727 m)   Wt 213 lb (96.6 kg)   BMI 32.39 kg/m  Last Weight:  Wt Readings from Last 1 Encounters:  04/14/18 213 lb (96.6 kg)   AOZ:HYQM mass index is 32.39 kg/m.     Last Height:   Ht Readings from Last 1 Encounters:  04/14/18 5\' 8"  (1.727 m)    Physical exam:  General: The patient is awake, alert and appears not in acute distress. The patient is well groomed. Head: Normocephalic, atraumatic. Neck is supple.   Cardiovascular:  Regular rate and rhythm without  murmurs or carotid bruit, and without distended neck veins. Respiratory: Lungs are clear to auscultation. Skin:  Without evidence of edema, or rash Trunk: The patient's posture is not stooped !   Neurologic exam : The patient is awake and alert, oriented to place and time.      Attention span & concentration ability appears normal.  Speech is fluent,  without dysarthria, dysphonia or aphasia.  Mood and affect are appropriate.  Cranial nerves: Pupils are equal and briskly reactive to light. Funduscopic exam deferred. Extraocular movements  in vertical and horizontal planes intact and without nystagmus. Visual fields by finger perimetry are intact. Hearing to finger rub intact.  Facial sensation intact to fine touch. Facial motor strength is symmetric and tongue and uvula move midline. Shoulder shrug was  symmetrical.   Motor exam: Normal tone, muscle bulk and symmetric strength in all extremities. He has weakness of his flexion, knee flexion and ankle plantar-flection. Sensory:  Fine touch, pinprick and vibration were tested in all extremities. Proprioception tested in the upper extremities was normal. Coordination: Rapid alternating movements in the fingers/hands was normal. Finger-to-nose maneuver  normal without evidence of ataxia, dysmetria or tremor. No resting tremor. No changes in penmanship.  Gait and station: Patient walks without assistive device and is able unassisted to climb up to the exam table. Strength within normal limits.  He is walking carefully - and turns with 4 steps now.  Stance is wide based -  Toe and heel stand were tested, he is weak on his toes. Tandem gait deferred- Romberg testing is positive, propulsive. Deep tendon reflexes: in the upper and lower extremities are symmetric and intact. Babinski maneuver response is downgoing.  Assessment:  After physical and neurologic examination, review of laboratory studies,  Personal review of imaging studies, reports of other /same  Imaging studies, results of polysomnography and / or neurophysiology testing and pre-existing records as far as provided in visit., my assessment is   The patient was advised of the nature of the diagnosed disorder , the treatment options and the  risks for general health and wellness arising from not treating the condition.   I spent more than 25 minutes of face to face time  with the patient.  Greater than 50% of time was spent in counseling and coordination of care. We have discussed the diagnosis and differential and I answered the patient's questions.    Plan:  Treatment plan and additional workup :  Peripheral axonal Neuropathy-  Prevent progression by taking Vit B 12 , Vit D, and keep moving- stationary bike is fine. Use the cane.   Metanexx. Protein serum phoresis.   Rheumatoid arthritis is  a likely explanation , rule out with lower spinal stenosis. MRI  Larey Seat, MD 06/15/2410, 4:64 PM  Certified in Neurology by ABPN Certified in [place] by [facility] [address], [place], [place] [address]

## 2018-04-15 LAB — PROTEIN ELECTROPHORESIS, SERUM
A/G RATIO SPE: 1.4 (ref 0.7–1.7)
ALPHA 2: 0.7 g/dL (ref 0.4–1.0)
Albumin ELP: 4 g/dL (ref 2.9–4.4)
Alpha 1: 0.2 g/dL (ref 0.0–0.4)
Beta: 1.1 g/dL (ref 0.7–1.3)
GLOBULIN, TOTAL: 2.8 g/dL (ref 2.2–3.9)
Gamma Globulin: 0.9 g/dL (ref 0.4–1.8)
TOTAL PROTEIN: 6.8 g/dL (ref 6.0–8.5)

## 2018-04-16 ENCOUNTER — Ambulatory Visit
Admission: RE | Admit: 2018-04-16 | Discharge: 2018-04-16 | Disposition: A | Discharge status: Home / Self Care | Payer: PPO | Source: Ambulatory Visit | Attending: Vascular Surgery | Admitting: Vascular Surgery

## 2018-04-16 ENCOUNTER — Telehealth: Payer: Self-pay | Admitting: *Deleted

## 2018-04-16 DIAGNOSIS — I728 Aneurysm of other specified arteries: Secondary | ICD-10-CM

## 2018-04-16 DIAGNOSIS — I723 Aneurysm of iliac artery: Secondary | ICD-10-CM | POA: Diagnosis not present

## 2018-04-16 MED ORDER — IOPAMIDOL (ISOVUE-370) INJECTION 76%
75.0000 mL | Freq: Once | INTRAVENOUS | Status: AC | PRN
  Administered 2018-04-16: 75 mL via INTRAVENOUS

## 2018-04-16 NOTE — Telephone Encounter (Signed)
Spoke with patient and informed him that the protein ephoresis that Dr. Brett Fairy checked was normal so this is not an autoimmune neuropathy. The most common cause of the the peripheral neuropathy is endocrine disorders like Diabetes. Pt verbalized understanding and stated he would keep track of his Diabetes. He stated he has finished the prednisone which made his blood sugar go up. He had no further questions or concerns.

## 2018-04-16 NOTE — Telephone Encounter (Signed)
-----   Message from Larey Seat, MD sent at 04/15/2018  6:09 PM EDT ----- Results for protein e phoresis were normal. Not an autoimmune neuropathy. Endocrine disorders such as DM are the most common cause of peripheral neuropathy.

## 2018-04-17 ENCOUNTER — Ambulatory Visit (INDEPENDENT_AMBULATORY_CARE_PROVIDER_SITE_OTHER): Payer: Self-pay | Admitting: Family

## 2018-04-17 ENCOUNTER — Encounter: Payer: Self-pay | Admitting: Family

## 2018-04-17 DIAGNOSIS — I77811 Abdominal aortic ectasia: Secondary | ICD-10-CM

## 2018-04-17 DIAGNOSIS — I728 Aneurysm of other specified arteries: Secondary | ICD-10-CM

## 2018-04-17 DIAGNOSIS — I723 Aneurysm of iliac artery: Secondary | ICD-10-CM

## 2018-04-17 NOTE — Progress Notes (Signed)
Pt had to leave for a church meeting. 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 Continue to keep blood pressure in good control with the help of his doctor.  Will follow up in 2 years with repeat CTA abd and pelvis to evaluate size of celiac and bilateral CIA aneurysms, and ectasia of abdominal aorta.    CTA abd/pelvis with and w/o contrast (04-16-18): FINDINGS: VASCULAR  Aorta: Similar atherosclerosis, tortuosity mild ectasia of the abdominal aorta. Infrarenal aorta has a maximal diameter of 3 cm as before. No significant interval change, dissection, enlargement, occlusive process, or retroperitoneal hemorrhage/hematoma.  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 Celiac branches remain patent.  SMA: Atherosclerotic origin but remains patent including its branches  Renals: Atherosclerotic origins but remain patent. No accessory renal artery appreciated.  IMA: Remains patent off the distal aorta including its branches  Inflow: Atherosclerosis and tortuosity of the iliac vessels without iliac inflow disease or occlusion. No significant interval change in common iliac fusiform aneurysms. Right common iliac aneurysm measures up to 2.5 cm. Left common iliac aneurysm measures up to 2.4 cm. Common, internal and external iliac arteries all remain patent.  Proximal Outflow: The visualized common femoral, proximal profunda femoral, proximal superficial femoral arteries are all patent  Veins: No significant veno-occlusive process.  Review of the MIP images confirms the above findings.  NON-VASCULAR  Lower chest: Stable peripheral right lower lobe 5 mm pulmonary nodule and adjacent scarring. Normal heart size. No pericardial or pleural effusion.  Hepatobiliary: Stable central right hepatic hypodensity at the liver hilum, suspect small  cyst. Noncalcified gallstones suspected. No biliary dilatation or obstruction. No other focal hepatic abnormality.  Pancreas: Unremarkable. No pancreatic ductal dilatation or surrounding inflammatory changes.  Spleen: Normal in size without focal abnormality.  Adrenals/Urinary Tract: Adrenal glands are unremarkable. Kidneys are normal, without renal calculi, focal lesion, or hydronephrosis. Bladder is unremarkable.  Stomach/Bowel: Negative for bowel obstruction, significant dilatation, ileus, free air. Appendix not visualized. No acute inflammatory process, fluid collection, abscess or ascites.  Lymphatic: No adenopathy.  Reproductive: Prostate gland is enlarged calcification. Seminal vesicles symmetric.  Other: No abdominal wall hernia or abnormality. No abdominopelvic ascites.  Musculoskeletal: Degenerative changes noted of the spine. No acute osseous finding.  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  NON-VASCULAR  Stable 5 mm right lower lobe pulmonary nodule compared to 04/19/2016.  No follow-up needed if patient is low-risk. Non-contrast chest CT can be considered in 12 months if patient is high-risk. This recommendation follows the consensus statement: Guidelines for Management of Incidental Pulmonary Nodules Detected on CT Images: From the Fleischner Society 2017; Radiology 2017; 284:228-243.  Noncalcified gallstones  Prostate enlargement  No other acute intra-abdominal or pelvic finding.

## 2018-04-27 DIAGNOSIS — L570 Actinic keratosis: Secondary | ICD-10-CM | POA: Diagnosis not present

## 2018-04-27 DIAGNOSIS — L820 Inflamed seborrheic keratosis: Secondary | ICD-10-CM | POA: Diagnosis not present

## 2018-04-27 DIAGNOSIS — D485 Neoplasm of uncertain behavior of skin: Secondary | ICD-10-CM | POA: Diagnosis not present

## 2018-04-27 DIAGNOSIS — D2239 Melanocytic nevi of other parts of face: Secondary | ICD-10-CM | POA: Diagnosis not present

## 2018-04-27 DIAGNOSIS — Z85828 Personal history of other malignant neoplasm of skin: Secondary | ICD-10-CM | POA: Diagnosis not present

## 2018-04-27 DIAGNOSIS — L821 Other seborrheic keratosis: Secondary | ICD-10-CM | POA: Diagnosis not present

## 2018-05-19 DIAGNOSIS — M069 Rheumatoid arthritis, unspecified: Secondary | ICD-10-CM | POA: Diagnosis not present

## 2018-05-19 DIAGNOSIS — M6281 Muscle weakness (generalized): Secondary | ICD-10-CM | POA: Diagnosis not present

## 2018-05-19 DIAGNOSIS — M1990 Unspecified osteoarthritis, unspecified site: Secondary | ICD-10-CM | POA: Diagnosis not present

## 2018-05-19 DIAGNOSIS — M79646 Pain in unspecified finger(s): Secondary | ICD-10-CM | POA: Diagnosis not present

## 2018-05-19 DIAGNOSIS — Z79899 Other long term (current) drug therapy: Secondary | ICD-10-CM | POA: Diagnosis not present

## 2018-07-13 DIAGNOSIS — E1169 Type 2 diabetes mellitus with other specified complication: Secondary | ICD-10-CM | POA: Diagnosis not present

## 2018-07-13 DIAGNOSIS — M1990 Unspecified osteoarthritis, unspecified site: Secondary | ICD-10-CM | POA: Diagnosis not present

## 2018-07-13 DIAGNOSIS — Z0000 Encounter for general adult medical examination without abnormal findings: Secondary | ICD-10-CM | POA: Diagnosis not present

## 2018-07-13 DIAGNOSIS — R200 Anesthesia of skin: Secondary | ICD-10-CM | POA: Diagnosis not present

## 2018-07-13 DIAGNOSIS — E1140 Type 2 diabetes mellitus with diabetic neuropathy, unspecified: Secondary | ICD-10-CM | POA: Diagnosis not present

## 2018-07-13 DIAGNOSIS — E785 Hyperlipidemia, unspecified: Secondary | ICD-10-CM | POA: Diagnosis not present

## 2018-07-13 DIAGNOSIS — J449 Chronic obstructive pulmonary disease, unspecified: Secondary | ICD-10-CM | POA: Diagnosis not present

## 2018-07-13 DIAGNOSIS — M069 Rheumatoid arthritis, unspecified: Secondary | ICD-10-CM | POA: Diagnosis not present

## 2018-07-13 DIAGNOSIS — I10 Essential (primary) hypertension: Secondary | ICD-10-CM | POA: Diagnosis not present

## 2018-07-27 DIAGNOSIS — E119 Type 2 diabetes mellitus without complications: Secondary | ICD-10-CM | POA: Diagnosis not present

## 2018-07-27 DIAGNOSIS — M1990 Unspecified osteoarthritis, unspecified site: Secondary | ICD-10-CM | POA: Diagnosis not present

## 2018-07-27 DIAGNOSIS — J449 Chronic obstructive pulmonary disease, unspecified: Secondary | ICD-10-CM | POA: Diagnosis not present

## 2018-07-27 DIAGNOSIS — M79646 Pain in unspecified finger(s): Secondary | ICD-10-CM | POA: Diagnosis not present

## 2018-07-27 DIAGNOSIS — M069 Rheumatoid arthritis, unspecified: Secondary | ICD-10-CM | POA: Diagnosis not present

## 2018-07-27 DIAGNOSIS — M19049 Primary osteoarthritis, unspecified hand: Secondary | ICD-10-CM | POA: Diagnosis not present

## 2018-07-27 DIAGNOSIS — M6281 Muscle weakness (generalized): Secondary | ICD-10-CM | POA: Diagnosis not present

## 2018-07-27 DIAGNOSIS — Z79899 Other long term (current) drug therapy: Secondary | ICD-10-CM | POA: Diagnosis not present

## 2018-08-05 DIAGNOSIS — N401 Enlarged prostate with lower urinary tract symptoms: Secondary | ICD-10-CM | POA: Diagnosis not present

## 2018-08-05 DIAGNOSIS — R351 Nocturia: Secondary | ICD-10-CM | POA: Diagnosis not present

## 2018-10-13 ENCOUNTER — Telehealth: Payer: Self-pay | Admitting: Neurology

## 2018-10-13 NOTE — Telephone Encounter (Signed)
Called the patient to inform them that our office has placed new protocols in place for our office visits. Due to the virus pandemic our office is reducing our number of office visits in order to minimize the risk to our patients and healthcare providers. Advised that our office is now providing the capability to offer the patients phone visits at this time. Informed of what that process looks like and informed that the telephone office visit will still be billed through insurance and due to [place] we need them to know since the appointment is taking place over the phone, we can't guarantee the security of the phone line. With that said if we do move forward I would have to get verbal consent to completed the call over the phone.  Patient gave verbal consent for the phone visit. I have reviewed his chart and he states things are the same since last visit. Advised that he would need to be ready by the phone by 3:15 for the phone visit. Informed that Dr Brett Fairy will contact him on the phone at the time of visit or as soon as she completes the visit before him. Best contact number is the home number [phone]. Pt verbalized understanding and was appreciative for the call

## 2018-10-14 ENCOUNTER — Other Ambulatory Visit: Payer: Self-pay

## 2018-10-14 ENCOUNTER — Ambulatory Visit (INDEPENDENT_AMBULATORY_CARE_PROVIDER_SITE_OTHER): Payer: PPO | Admitting: Neurology

## 2018-10-14 DIAGNOSIS — E1142 Type 2 diabetes mellitus with diabetic polyneuropathy: Secondary | ICD-10-CM

## 2018-10-14 DIAGNOSIS — G63 Polyneuropathy in diseases classified elsewhere: Secondary | ICD-10-CM | POA: Diagnosis not present

## 2018-10-14 DIAGNOSIS — E349 Endocrine disorder, unspecified: Secondary | ICD-10-CM | POA: Diagnosis not present

## 2018-10-14 DIAGNOSIS — I728 Aneurysm of other specified arteries: Secondary | ICD-10-CM

## 2018-10-14 DIAGNOSIS — J449 Chronic obstructive pulmonary disease, unspecified: Secondary | ICD-10-CM | POA: Diagnosis not present

## 2018-10-15 DIAGNOSIS — M25561 Pain in right knee: Secondary | ICD-10-CM | POA: Diagnosis not present

## 2018-10-15 DIAGNOSIS — E1351 Other specified diabetes mellitus with diabetic peripheral angiopathy without gangrene: Secondary | ICD-10-CM | POA: Diagnosis not present

## 2018-10-15 DIAGNOSIS — L602 Onychogryphosis: Secondary | ICD-10-CM | POA: Diagnosis not present

## 2018-10-15 DIAGNOSIS — Z5181 Encounter for therapeutic drug level monitoring: Secondary | ICD-10-CM | POA: Diagnosis not present

## 2018-10-15 DIAGNOSIS — L84 Corns and callosities: Secondary | ICD-10-CM | POA: Diagnosis not present

## 2018-10-15 DIAGNOSIS — M25562 Pain in left knee: Secondary | ICD-10-CM | POA: Diagnosis not present

## 2018-10-15 DIAGNOSIS — E7800 Pure hypercholesterolemia, unspecified: Secondary | ICD-10-CM | POA: Diagnosis not present

## 2018-10-15 DIAGNOSIS — G8929 Other chronic pain: Secondary | ICD-10-CM | POA: Diagnosis not present

## 2018-10-15 DIAGNOSIS — Z0000 Encounter for general adult medical examination without abnormal findings: Secondary | ICD-10-CM | POA: Diagnosis not present

## 2018-10-22 DIAGNOSIS — Z124 Encounter for screening for malignant neoplasm of cervix: Secondary | ICD-10-CM | POA: Diagnosis not present

## 2018-10-22 DIAGNOSIS — N763 Subacute and chronic vulvitis: Secondary | ICD-10-CM | POA: Diagnosis not present

## 2018-10-22 DIAGNOSIS — Z79899 Other long term (current) drug therapy: Secondary | ICD-10-CM | POA: Diagnosis not present

## 2018-10-22 DIAGNOSIS — M79645 Pain in left finger(s): Secondary | ICD-10-CM | POA: Diagnosis not present

## 2018-10-22 DIAGNOSIS — J449 Chronic obstructive pulmonary disease, unspecified: Secondary | ICD-10-CM | POA: Diagnosis not present

## 2018-10-22 DIAGNOSIS — M19049 Primary osteoarthritis, unspecified hand: Secondary | ICD-10-CM | POA: Diagnosis not present

## 2018-10-22 DIAGNOSIS — M79646 Pain in unspecified finger(s): Secondary | ICD-10-CM | POA: Diagnosis not present

## 2018-10-22 DIAGNOSIS — E119 Type 2 diabetes mellitus without complications: Secondary | ICD-10-CM | POA: Diagnosis not present

## 2018-10-22 DIAGNOSIS — M6281 Muscle weakness (generalized): Secondary | ICD-10-CM | POA: Diagnosis not present

## 2018-10-22 DIAGNOSIS — M069 Rheumatoid arthritis, unspecified: Secondary | ICD-10-CM | POA: Diagnosis not present

## 2018-10-22 DIAGNOSIS — M1990 Unspecified osteoarthritis, unspecified site: Secondary | ICD-10-CM | POA: Diagnosis not present

## 2018-11-01 ENCOUNTER — Encounter: Payer: Self-pay | Admitting: Neurology

## 2018-11-01 DIAGNOSIS — G63 Polyneuropathy in diseases classified elsewhere: Principal | ICD-10-CM

## 2018-11-01 DIAGNOSIS — E349 Endocrine disorder, unspecified: Secondary | ICD-10-CM | POA: Insufficient documentation

## 2018-11-01 NOTE — Patient Instructions (Signed)
Rheumatoid Arthritis Rheumatoid arthritis (RA) is a long-term (chronic) disease that causes inflammation in your joints. RA may start slowly. It usually affects the small joints of the hands and feet. Usually, the same joints are affected on both sides of your body. Inflammation from RA can also affect other parts of your body, including your heart, eyes, or lungs. RA is an autoimmune disease. That means that your body's defense system (immune system) mistakenly attacks healthy body tissues. There is no cure for RA, but medicines can help your symptoms and halt or slow down the progression of the disease. What are the causes? The exact cause of RA is not known. What increases the risk? This condition is more likely to develop in:  Women.  People who have a family history of RA or other autoimmune diseases. What are the signs or symptoms? Symptoms of this condition vary from person to person. Symptoms usually start gradually. They are often worse in the morning. The first symptom may be morning stiffness that lasts longer than 30 minutes. As RA progresses, symptoms may include:  Pain, stiffness, swelling, warmth, and tenderness in joints on both sides of your body.  Loss of energy.  Loss of appetite.  Weight loss.  Low-grade fever.  Dry eyes and dry mouth.  Firm lumps (rheumatoid nodules) that grow beneath your skin in areas such as your forearm bones near your elbows and on your hands.  Changes in the appearance of joints (deformity) and loss of joint function. Symptoms of RA often come and go. Sometimes, symptoms get worse for a period of time. These are called flares. How is this diagnosed? This condition is diagnosed based on your symptoms, medical history, and physical exam. You may have X-rays or MRI to check for the type of joint changes that are caused by RA. You may also have blood tests to look for:  Proteins (antibodies) that your immune system may make if you have RA.  They include rheumatoid factor (RF) and anti-CCP. ? When blood tests show these proteins, you are said to have "seropositive RA." ? When blood tests do not show these proteins, you may have "seronegative RA."  Inflammation in your blood.  A low number of red blood cells (anemia). How is this treated? The goals of treatment are to relieve pain, reduce inflammation, and slow down or stop joint damage and disability. Treatment may include:  Lifestyle changes. It is important to rest, eat a healthy diet, and exercise.  Medicines. Your health care provider may adjust your medicines every 3 months until treatment goals are reached. Common medicines include: ? Pain relievers (analgesics). ? Corticosteroids and NSAIDs to reduce inflammation. ? Disease-modifying antirheumatic drugs (DMARDs) to try to slow the course of the disease. ? Biologic response modifiers to reduce inflammation and damage.  Physical therapy and occupational therapy.  Surgery, if you have severe joint damage. Joint replacement or fusing of joints may be needed. Your health care provider will work with you to identify the best treatment option for you based on assessment of the overall disease activity in your body. Follow these instructions at home:  Take over-the-counter and prescription medicines only as told by your health care provider.  Start an exercise program as told by your health care provider.  Rest when you are having a flare.  Return to your normal activities as told by your health care provider. Ask your health care provider what activities are safe for you.  Keep all follow-up visits as told  by your health care provider. This is important. Where to find more information  [employer] of Rheumatology: www.rheumatology.[place]: www.arthritis.org Contact a health care provider if:  You have a flare-up of RA symptoms.  You have a fever.  You have side effects from your  medicines. Get help right away if:  You have chest pain.  You have trouble breathing.  You quickly develop a hot, painful joint that is more severe than your usual joint aches. This information is not intended to replace advice given to you by your health care provider. Make sure you discuss any questions you have with your health care provider. Document Released: 06/28/2000 Document Revised: 12/12/2016 Document Reviewed: 04/13/2015 Elsevier Interactive Patient Education  2019 [employer].

## 2018-11-01 NOTE — Progress Notes (Signed)
[facility]   Provider:  Larey Seat, M D  Primary Care Physician:  Maurice Small, MD   Referring Provider: Darcus Austin, MD   Virtual Visit via Telephone Note  I connected with [name] on 10-14-2018 at  3:30 PM EDT by telephone and verified that I am speaking with the correct person using two identifiers.   I discussed the limitations, risks, security and privacy concerns of performing an evaluation and management service by telephone and the availability of in person appointments. I also discussed with the patient that there may be a patient responsible charge related to this service. The patient expressed understanding and agreed to proceed.   Larey Seat, MD   HPI:  [name] is a 83 y.o. male patient I spoke to by telephone encounter on  10-14-2018, patient doing better on steroid taper and methotrexate - Patient feels he finally received the right diagnosis  through rheumatology- and is .doing well.  He is still bothered by his neuropathy , the feeling of waking on pebbles, tingling, and yet numb. His feet feel heavy.  Dr Jannifer Franklin did examine him by NCV and EMG. Axonal neuropathy was diagnosed.  He briefly spoke about the remote results of his MRI of the back.  He states that visit diagnosis he has regained a good appetite on methotrexate and folic acid he still has a diabetes mellitus and hypertension which are medication controlled but it seems that diabetes was not the only cause for neuropathy.  He reports that he is doing okay but he feels numb his legs feel heavy especially his feet and often when he wakes up in the morning he is somewhat stiff.  He does not have pain while in bed or during the night.  He reports difficulties when he walks in a longer big box store the heaviness may indicate weakness.  He feels better when he can lean on a shopping cart.  He denies any recent falls.  His most recent HbA1c was 6.2.  He is followed by Dr. Cindy Hazy rheumatology  at [facility].  She also prepared him that his hands may be affected by the diagnosis.    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  He is ANA positive and rheumatoid factor positive he had stopped statins in order to improve his leg strength but now walks better on methotrexate.  His arthropathy has much improved.  I would like the patient to continue his rheumatological regimen I will be happy to see him as needed the patient was agreeable to a visit in 6 months or earlier if needed.  I am especially happy that he did not have any falls.    Notes recorded by Larey Seat, MD on 02/25/2018 at 6:16 PM EDT Most likely origin for this neuropathy is an endocrine disorder, vitamin deficiency or abnormal plasmaprotein. -----Notes recorded by Aleks Nawrot, Asencion Partridge, MD on 02/25/2018 at 6:10 PM EDT [name] is a 83 year old gentleman with a 7-year history of some numbness in the feet and some mild gait instability. He reports no back pain or pain down the legs. He is treated on steroids and has developed higher blood glucose levels.   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 EMG evaluation of the right lower extremity shows mild chronic stable distal signs of denervation consistent with a diagnosis of peripheral neuropathy. No evidence of an overlying lumbosacral radiculopathy was seen.  [name] MD 02/25/2018 1:47 PM Chief complaint according to patient : " Heavy feet, and a bit of tension headaches" .  Medical history : Mr. 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  He had stopped statins in order to improve his leg strength, but now walks better on methotraxate.  Social history:  Married, originally from [place], non smoker now, non ETOH,   Review of Systems: Out of a complete 14 system review, the patient complains of only the following symptoms, and all other reviewed systems are negative.  Low back pain, wider based gait, increased  fall risk-but no falls in 6 month. . Feet are numb.    Social History   Socioeconomic History   Marital status: Married    Spouse name: [name]   Number of children: 3   Years of education: 13   Highest education level: Not on file  Occupational History   Occupation: Retired  Scientist, product/process development strain: Not on file   Food insecurity:    Worry: Not on file    Inability: Not on Lexicographer needs:    Medical: Not on file    Non-medical: Not on file  Tobacco Use   Smoking status: Former Smoker    Packs/day: 0.50    Years: 60.00    Pack years: 30.00    Types: Cigarettes    Last attempt to quit: 04/07/2014    Years since quitting: 4.5   Smokeless tobacco: Never Used  Substance and Sexual Activity   Alcohol use: Yes    Alcohol/week: 14.0 standard drinks    Types: 14 Standard drinks or equivalent per week    Comment: 1-2 before dinner   Drug use: No   Sexual activity: Not on file  Lifestyle   Physical activity:    Days per week: Not on file    Minutes per session: Not on file   Stress: Not on file  Relationships   Social connections:    Talks on phone: Not on file    Gets together: Not on file    Attends religious service: Not on file    Active member of club or organization: Not on file    Attends meetings of clubs or organizations: Not on file    Relationship  status: Not on file   Intimate partner violence:    Fear of current or ex partner: Not on file    Emotionally abused: Not on file    Physically abused: Not on file    Forced sexual activity: Not on file  Other Topics Concern   Not on file  Social History Narrative   Patient is married [name]).   Patient has three children and 7 grandchildren.   Patient is retired.   Patient drinks one serving of coffee daily.   Patient is right-handed.   Patient has a college education.    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  Current Outpatient Medications  Medication Sig Dispense Refill   albuterol (PROVENTIL HFA;VENTOLIN HFA) 108 (90 BASE) MCG/ACT inhaler Inhale 2 puffs into the lungs every 6 (six) hours as needed for wheezing or shortness of breath.     Ascorbic Acid (VITAMIN C PO) Take 1 tablet by mouth daily.     aspirin 81 MG tablet Take 81 mg by mouth daily.     cetirizine (ZYRTEC) 10 MG tablet Take 10 mg by mouth daily.     famotidine (PEPCID) 20 MG tablet One at bedtime     finasteride (PROSCAR) 5 MG tablet Take 5 mg by mouth daily.     folic acid (FOLVITE) 1 MG tablet Take 1 mg by mouth daily.     glipiZIDE (GLUCOTROL XL) 5 MG 24 hr tablet Take 5 mg by mouth daily with breakfast. x30 days per PCP     LOSARTAN POTASSIUM PO Take 100 mg by mouth daily.     metFORMIN (GLUCOPHAGE) 500 MG  tablet Take 1 tablet (500 mg total) by mouth 2 (two) times daily with a meal. Resume tonight.     methotrexate 2.5 MG tablet Take 2.5 mg by mouth once a week. Take 6 tablets orally once a week     Multiple Vitamin (MULTIVITAMIN WITH MINERALS) TABS Take 1 tablet by mouth daily.     omeprazole (PRILOSEC) 40 MG capsule Take 40 mg by mouth daily.     ONE TOUCH ULTRA TEST test strip      ONETOUCH DELICA LANCETS 57W MISC      pantoprazole (PROTONIX) 40 MG tablet Take 1 tablet (40 mg total) by mouth daily. Take 30-60 min before first meal of the day 30 tablet 2   PREDNISONE PO Take 7.5 mg by mouth daily with breakfast. Take 3-4 pills once a day      sildenafil (VIAGRA) 50 MG tablet Take 50 mg by mouth daily as needed for erectile dysfunction.     SYMBICORT 160-4.5 MCG/ACT inhaler INHALE 2 PUFFS INTO THE LUNGS 2 TIMES DAILY 1 Inhaler 11   Tamsulosin HCl (FLOMAX) 0.4 MG CAPS Take 0.4 mg by mouth daily.     Tiotropium Bromide Monohydrate (SPIRIVA RESPIMAT) 1.25 MCG/ACT AERS Inhale 2 puffs into the lungs daily. 1 Inhaler 0   Tiotropium Bromide Monohydrate (SPIRIVA RESPIMAT) 1.25 MCG/ACT AERS Inhale 2 puffs into the lungs 2 (two) times daily. 1 Inhaler 11   No current facility-administered medications for this visit.     Allergies as of 10/14/2018 - Review Complete 04/17/2018  Allergen Reaction Noted   Lisinopril Cough 12/31/2017   Pneumovax 23 [pneumococcal vac polyvalent]  09/10/2013   Prednisone Other (See Comments) 08/03/2011   Prevnar 13 [pneumococcal 13-val conj vacc] Swelling 09/13/2014   Penicillins Rash 08/03/2011   Sulfa antibiotics Rash 08/03/2011    Vitals: There were no vitals taken for this visit. Last Weight:  Wt Readings  from Last 1 Encounters:  04/14/18 213 lb (96.6 kg)   SPQ:ZRAQT is no height or weight on file to calculate BMI.     Last Height:   Ht Readings from Last 1 Encounters:  04/14/18 5\' 8"  (1.727 m)      Attention span & concentration ability  appears normal.  Speech is fluent,  without dysarthria, dysphonia or aphasia.   Assessment:  NP Plan:  Treatment plan and additional workup :  Peripheral axonal Neuropathy-   Prevent progression by taking Vit B 12 , Vit D, and keep moving-  Exercise on a stationary bike is fine. Use the cane when walking.  Try methylated B 12 (Metanexx)  Protein serum phoresis was negative .   Rheumatoid arthritis is a likely explanation , rule out with lower spinal stenosis. MRI    discussed the assessment and treatment plan with the patient. The patient was provided an opportunity to ask questions and all were answered. The patient agreed with the plan and demonstrated an understanding of the instructions.   The patient was advised to call back or seek an in-person evaluation if the symptoms worsen or if the condition fails to improve as anticipated.  Rv in 6 month with MD alternating with NP   I provided 15 minutes of non-face-to-face time during this encounter.   Larey Seat, MD -[phone], 4:56 PM  Certified in Neurology by ABPN Certified in [place] by [facility] [address] [place], [place] [address]

## 2019-01-08 ENCOUNTER — Telehealth: Payer: Self-pay | Admitting: Neurology

## 2019-01-08 NOTE — Telephone Encounter (Signed)
Pt has called , he is aware of his f/u in Oct but he is asking for a call to discuss the worsening of his neuropathy he is experiencing.  Pt was told he could not be scheduled for an appointment at this time due to a balance showing, pt did however give the okay for a message to be sent for him to get a call on Monday from RN

## 2019-01-11 NOTE — Telephone Encounter (Signed)
Called the patient back. Advised that in the last ov note it was mentioned that the patient needed to incorporate vitamin b 12 and vitamin d. He states that he did not but he will add that. Patient states that in the last week he has found he has had increase in the neuropathy discomfort. Pt states he has had a harder time with walking long distances. He states still able to ride his bike daily. As of now his apt is oct 1st. I have advised I will place on my wait list in case something opens up. Advised the patient to let us know if adding the vitamins help him. Pt verbalized understanding.

## 2019-01-11 NOTE — Telephone Encounter (Signed)
Called the patient to discuss his concerns in regards to his neuropathy worsening. I wanted to review medications. I don't see where is taking prescription for neuropathy. Dr Dohmeier mentioned in previous office visit about Preventing progression by taking Vit B 12 , Vit D. Wanted to see if he was taking this. Also a MRI was ordered and I don't see where that was completed wanted to advise the patient have that completed if possible. There was no answer. LVM for the patient to call back.

## 2019-01-11 NOTE — Telephone Encounter (Signed)
Pt returning call please call back °

## 2019-01-13 DIAGNOSIS — M069 Rheumatoid arthritis, unspecified: Secondary | ICD-10-CM | POA: Diagnosis not present

## 2019-01-13 DIAGNOSIS — D692 Other nonthrombocytopenic purpura: Secondary | ICD-10-CM | POA: Diagnosis not present

## 2019-01-13 DIAGNOSIS — J45909 Unspecified asthma, uncomplicated: Secondary | ICD-10-CM | POA: Diagnosis not present

## 2019-01-13 DIAGNOSIS — R05 Cough: Secondary | ICD-10-CM | POA: Diagnosis not present

## 2019-01-13 DIAGNOSIS — Z5181 Encounter for therapeutic drug level monitoring: Secondary | ICD-10-CM | POA: Diagnosis not present

## 2019-01-13 DIAGNOSIS — E559 Vitamin D deficiency, unspecified: Secondary | ICD-10-CM | POA: Diagnosis not present

## 2019-01-13 DIAGNOSIS — R0602 Shortness of breath: Secondary | ICD-10-CM | POA: Diagnosis not present

## 2019-01-13 DIAGNOSIS — J449 Chronic obstructive pulmonary disease, unspecified: Secondary | ICD-10-CM | POA: Diagnosis not present

## 2019-01-13 DIAGNOSIS — I10 Essential (primary) hypertension: Secondary | ICD-10-CM | POA: Diagnosis not present

## 2019-01-13 DIAGNOSIS — E785 Hyperlipidemia, unspecified: Secondary | ICD-10-CM | POA: Diagnosis not present

## 2019-01-13 DIAGNOSIS — E1140 Type 2 diabetes mellitus with diabetic neuropathy, unspecified: Secondary | ICD-10-CM | POA: Diagnosis not present

## 2019-01-13 DIAGNOSIS — E538 Deficiency of other specified B group vitamins: Secondary | ICD-10-CM | POA: Diagnosis not present

## 2019-01-21 DIAGNOSIS — M19049 Primary osteoarthritis, unspecified hand: Secondary | ICD-10-CM | POA: Diagnosis not present

## 2019-01-21 DIAGNOSIS — J449 Chronic obstructive pulmonary disease, unspecified: Secondary | ICD-10-CM | POA: Diagnosis not present

## 2019-01-21 DIAGNOSIS — M069 Rheumatoid arthritis, unspecified: Secondary | ICD-10-CM | POA: Diagnosis not present

## 2019-01-21 DIAGNOSIS — G629 Polyneuropathy, unspecified: Secondary | ICD-10-CM | POA: Diagnosis not present

## 2019-01-21 DIAGNOSIS — M1990 Unspecified osteoarthritis, unspecified site: Secondary | ICD-10-CM | POA: Diagnosis not present

## 2019-01-21 DIAGNOSIS — Z79899 Other long term (current) drug therapy: Secondary | ICD-10-CM | POA: Diagnosis not present

## 2019-01-21 DIAGNOSIS — E119 Type 2 diabetes mellitus without complications: Secondary | ICD-10-CM | POA: Diagnosis not present

## 2019-04-15 ENCOUNTER — Other Ambulatory Visit: Payer: Self-pay

## 2019-04-15 ENCOUNTER — Encounter: Payer: Self-pay | Admitting: Family Medicine

## 2019-04-15 ENCOUNTER — Ambulatory Visit: Payer: Self-pay | Admitting: Adult Health

## 2019-04-15 ENCOUNTER — Ambulatory Visit (INDEPENDENT_AMBULATORY_CARE_PROVIDER_SITE_OTHER): Payer: PPO | Admitting: Family Medicine

## 2019-04-15 VITALS — BP 150/86 | HR 89 | Temp 97.5°F | Ht 68.0 in | Wt 218.0 lb

## 2019-04-15 DIAGNOSIS — E349 Endocrine disorder, unspecified: Secondary | ICD-10-CM

## 2019-04-15 DIAGNOSIS — G63 Polyneuropathy in diseases classified elsewhere: Secondary | ICD-10-CM | POA: Diagnosis not present

## 2019-04-15 NOTE — Patient Instructions (Signed)
Continue current treatment regimen  Continue daily exercise  Follow up in 6-12 months   Peripheral Neuropathy Peripheral neuropathy is a type of nerve damage. It affects nerves that carry signals between the spinal cord and the arms, legs, and the rest of the body (peripheral nerves). It does not affect nerves in the spinal cord or brain. In peripheral neuropathy, one nerve or a group of nerves may be damaged. Peripheral neuropathy is a broad category that includes many specific nerve disorders, like diabetic neuropathy, hereditary neuropathy, and carpal tunnel syndrome. What are the causes? This condition may be caused by:  Diabetes. This is the most common cause of peripheral neuropathy.  Nerve injury.  Pressure or stress on a nerve that lasts a long time.  Lack (deficiency) of B vitamins. This can result from alcoholism, poor diet, or a restricted diet.  Infections.  Autoimmune diseases, such as rheumatoid arthritis and systemic lupus erythematosus.  Nerve diseases that are passed from parent to child (inherited).  Some medicines, such as cancer medicines (chemotherapy).  Poisonous (toxic) substances, such as lead and mercury.  Too little blood flowing to the legs.  Kidney disease.  Thyroid disease. In some cases, the cause of this condition is not known. What are the signs or symptoms? Symptoms of this condition depend on which of your nerves is damaged. Common symptoms include:  Loss of feeling (numbness) in the feet, hands, or both.  Tingling in the feet, hands, or both.  Burning pain.  Very sensitive skin.  Weakness.  Not being able to move a part of the body (paralysis).  Muscle twitching.  Clumsiness or poor coordination.  Loss of balance.  Not being able to control your bladder.  Feeling dizzy.  Sexual problems. How is this diagnosed? Diagnosing and finding the cause of peripheral neuropathy can be difficult. Your health care provider will take  your medical history and do a physical exam. A neurological exam will also be done. This involves checking things that are affected by your brain, spinal cord, and nerves (nervous system). For example, your health care provider will check your reflexes, how you move, and what you can feel. You may have other tests, such as:  Blood tests.  Electromyogram (EMG) and nerve conduction tests. These tests check nerve function and how well the nerves are controlling the muscles.  Imaging tests, such as CT scans or MRI to rule out other causes of your symptoms.  Removing a small piece of nerve to be examined in a lab (nerve biopsy). This is rare.  Removing and examining a small amount of the fluid that surrounds the brain and spinal cord (lumbar puncture). This is rare. How is this treated? Treatment for this condition may involve:  Treating the underlying cause of the neuropathy, such as diabetes, kidney disease, or vitamin deficiencies.  Stopping medicines that can cause neuropathy, such as chemotherapy.  Medicine to relieve pain. Medicines may include: ? Prescription or over-the-counter pain medicine. ? Antiseizure medicine. ? Antidepressants. ? Pain-relieving patches that are applied to painful areas of skin.  Surgery to relieve pressure on a nerve or to destroy a nerve that is causing pain.  Physical therapy to help improve movement and balance.  Devices to help you move around (assistive devices). Follow these instructions at home: Medicines  Take over-the-counter and prescription medicines only as told by your health care provider. Do not take any other medicines without first asking your health care provider.  Do not drive or use heavy machinery  while taking prescription pain medicine. Lifestyle   Do not use any products that contain nicotine or tobacco, such as cigarettes and e-cigarettes. Smoking keeps blood from reaching damaged nerves. If you need help quitting, ask your  health care provider.  Avoid or limit alcohol. Too much alcohol can cause a vitamin B deficiency, and vitamin B is needed for healthy nerves.  Eat a healthy diet. This includes: ? Eating foods that are high in fiber, such as fresh fruits and vegetables, whole grains, and beans. ? Limiting foods that are high in fat and processed sugars, such as fried or sweet foods. General instructions   If you have diabetes, work closely with your health care provider to keep your blood sugar under control.  If you have numbness in your feet: ? Check every day for signs of injury or infection. Watch for redness, warmth, and swelling. ? Wear padded socks and comfortable shoes. These help protect your feet.  Develop a good support system. Living with peripheral neuropathy can be stressful. Consider talking with a mental health specialist or joining a support group.  Use assistive devices and attend physical therapy as told by your health care provider. This may include using a walker or a cane.  Keep all follow-up visits as told by your health care provider. This is important. Contact a health care provider if:  You have new signs or symptoms of peripheral neuropathy.  You are struggling emotionally from dealing with peripheral neuropathy.  Your pain is not well-controlled. Get help right away if:  You have an injury or infection that is not healing normally.  You develop new weakness in an arm or leg.  You fall frequently. Summary  Peripheral neuropathy is when the nerves in the arms, or legs are damaged, resulting in numbness, weakness, or pain.  There are many causes of peripheral neuropathy, including diabetes, pinched nerves, vitamin deficiencies, autoimmune disease, and hereditary conditions.  Diagnosing and finding the cause of peripheral neuropathy can be difficult. Your health care provider will take your medical history, do a physical exam, and do tests, including blood tests and  nerve function tests.  Treatment involves treating the underlying cause of the neuropathy and taking medicines to help control pain. Physical therapy and assistive devices may also help. This information is not intended to replace advice given to you by your health care provider. Make sure you discuss any questions you have with your health care provider. Document Released: 06/21/2002 Document Revised: 06/13/2017 Document Reviewed: 09/09/2016 Elsevier Patient Education  2020 [employer].

## 2019-04-15 NOTE — Progress Notes (Signed)
PATIENT: Oluwatimilehin Liesch DOB: [birth year]  REASON FOR VISIT: follow up HISTORY FROM: patient  Chief Complaint  Patient presents with   Follow-up    6 mon f/u. Wife present Rm 1. Patient mentioned that he has some heavyness to his feet that comes and goes     HISTORY OF PRESENT ILLNESS: Today 04/20/19 Min Sarra is a 83 y.o. male here today for follow up for neuropathy.  He feels that he is doing quite well.   He is present today with his wife who agrees.  He does continue to have some heaviness in his feet.  He is able to exercise daily.  He walks and rides a stationary bike.  He continues close follow-up with rheumatology on methotrexate and prednisone.  He is not interested in taking medications to help with neuropathy at this time.  He feels that overall he is doing well and without concerns today.  HISTORY: (copied from Dr Dohmeier's note on 10/14/2018)  HPI:  Mihcael Bouchie is a 83 y.o. male patient I spoke to by telephone encounter on  10-14-2018, patient doing better on steroid taper and methotrexate - Patient feels he finally received the right diagnosis  through rheumatology- and is .doing well.  He is still bothered by his neuropathy , the feeling of waking on pebbles, tingling, and yet numb. His feet feel heavy.  Dr Jannifer Franklin did examine him by NCV and EMG. Axonal neuropathy was diagnosed.  He briefly spoke about the remote results of his MRI of the back.  He states that visit diagnosis he has regained a good appetite on methotrexate and folic acid he still has a diabetes mellitus and hypertension which are medication controlled but it seems that diabetes was not the only cause for neuropathy.  He reports that he is doing okay but he feels numb his legs feel heavy especially his feet and often when he wakes up in the morning he is somewhat stiff.  He does not have pain while in bed or during the night.  He reports difficulties when he walks in a longer big box store the heaviness  may indicate weakness.  He feels better when he can lean on a shopping cart.  He denies any recent falls.  His most recent HbA1c was 6.2.  He is followed by Dr. Cindy Hazy rheumatology at [facility].  She also prepared him that his hands may be affected by the diagnosis.    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  He is ANA positive and rheumatoid factor positive he had stopped statins in order to improve his leg strength but now walks better on methotrexate.  His arthropathy has much improved.  I would like the patient to continue his rheumatological regimen I will be happy to see him as needed the patient was agreeable to a visit in 6 months or earlier if needed.  I am especially happy that he did not have any falls.  Notes recorded by Larey Seat, MD on 02/25/2018 at 6:16 PM EDT Most likely origin for this neuropathy is an endocrine disorder, vitamin deficiency or abnormal plasmaprotein. -----Notes recorded by Larey Seat, MD on 02/25/2018 at 6:10 PM EDT Dayven Himmelman is a 83 year old gentleman with a 7-year history of some numbness in  the feet and some mild gait instability. He reports no back pain or pain down the legs. He is treated on steroids and has developed higher blood glucose levels.   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 EMG evaluation of the right lower extremity shows mild chronic stable distal signs of denervation consistent with a diagnosis of peripheral neuropathy. No evidence of an overlying lumbosacral radiculopathy was seen.  [name] MD 02/25/2018 1:47 PM Chief complaint  according to patient : " Heavy feet, and a bit of tension headaches" .  Medical history : Mr. 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  He had stopped statins in order to improve his leg strength, but now walks better on methotraxate.  Social history:  Married, originally from [place], non smoker now, non ETOH,    REVIEW OF SYSTEMS: Out of a complete 14 system review of symptoms, the patient complains only of the following symptoms, numbness and all other reviewed systems are negative.   ALLERGIES: Allergies  Allergen Reactions   Lisinopril Cough   Pneumovax 23 [Pneumococcal Vac Polyvalent]     Fever, sweats   Prednisone Other (See Comments)    Sweating with high dose   Prevnar 13 [Pneumococcal 13-Val Conj Vacc] Swelling   Penicillins Rash   Sulfa Antibiotics Rash    HOME MEDICATIONS: Outpatient Medications Prior to Visit  Medication Sig Dispense Refill   albuterol (PROVENTIL HFA;VENTOLIN HFA) 108 (90 BASE) MCG/ACT inhaler Inhale 2 puffs into the lungs every 6 (six) hours as needed for wheezing or shortness of breath.     Ascorbic Acid (VITAMIN C PO) Take 1 tablet by mouth daily.     aspirin 81 MG tablet Take 81 mg by mouth daily.     cetirizine (ZYRTEC) 10 MG tablet Take 10 mg by mouth daily.     famotidine (PEPCID) 20 MG tablet One at bedtime     finasteride (PROSCAR) 5 MG tablet Take 5 mg by mouth daily.     folic acid (FOLVITE) 1 MG tablet Take 1 mg by mouth daily.     glipiZIDE (GLUCOTROL XL) 5 MG 24 hr tablet Take 5 mg by mouth daily with breakfast. x30 days per PCP     losartan (COZAAR) 100 MG tablet Take 100 mg by mouth daily.     metFORMIN (GLUCOPHAGE) 500 MG tablet Take 1 tablet (500 mg total) by mouth 2 (two) times  daily with a meal. Resume tonight.     methotrexate 2.5 MG tablet Take 2.5 mg by mouth once a week. Take 6 tablets orally once a week     Multiple Vitamin (MULTIVITAMIN WITH MINERALS) TABS Take 1 tablet by mouth daily.     omeprazole (PRILOSEC) 40 MG capsule Take 40 mg by mouth daily.     ONE TOUCH ULTRA TEST test strip      ONETOUCH DELICA LANCETS [id number] MISC      pantoprazole (PROTONIX) 40 MG tablet Take 1 tablet (40 mg total) by mouth daily. Take 30-60 min before first meal of the day 30 tablet 2   PREDNISONE PO Take 7.5 mg by mouth daily with breakfast. Take 3-4 pills once a day  sildenafil (VIAGRA) 50 MG tablet Take 50 mg by mouth daily as needed for erectile dysfunction.     SYMBICORT 160-4.5 MCG/ACT inhaler INHALE 2 PUFFS INTO THE LUNGS 2 TIMES DAILY 1 Inhaler 11   Tamsulosin HCl (FLOMAX) 0.4 MG CAPS Take 0.4 mg by mouth daily.     Tiotropium Bromide Monohydrate (SPIRIVA RESPIMAT) 1.25 MCG/ACT AERS Inhale 2 puffs into the lungs daily. 1 Inhaler 0   Tiotropium Bromide Monohydrate (SPIRIVA RESPIMAT) 1.25 MCG/ACT AERS Inhale 2 puffs into the lungs 2 (two) times daily. 1 Inhaler 11   LOSARTAN POTASSIUM PO Take 100 mg by mouth daily.     No facility-administered medications prior to visit.     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 No pertinent surgical history.  FAMILY HISTORY: Family History  Problem Relation Age of Onset     Stroke Father     SOCIAL HISTORY: Social History   Socioeconomic History   Marital status: Married    Spouse name: [name]   Number of children: 3   Years of education: 13   Highest education level: Not on file  Occupational History   Occupation: Retired  Scientist, product/process development strain: Not on file   Food insecurity    Worry: Not on file    Inability: Not on Lexicographer needs    Medical: Not on file    Non-medical: Not on file  Tobacco Use   Smoking status: Former Smoker    Packs/day: 0.50    Years: 60.00    Pack years: 30.00    Types: Cigarettes    Quit date: 04/07/2014    Years since quitting: 5.0   Smokeless tobacco: Never Used  Substance and Sexual Activity   Alcohol use: Yes    Alcohol/week: 14.0 standard drinks    Types: 14 Standard drinks or equivalent per week    Comment: 1-2 before dinner   Drug use: No   Sexual activity: Not on file  Lifestyle   Physical activity    Days per week: Not on file    Minutes per session: Not on file   Stress: Not on file  Relationships   Social connections    Talks on phone: Not on file    Gets together: Not on file    Attends religious service: Not on file    Active member of club or organization: Not on file    Attends meetings of clubs or organizations: Not on file    Relationship status: Not on file   Intimate partner violence    Fear of current or ex partner: Not on file    Emotionally abused: Not on file    Physically abused: Not on file    Forced sexual activity: Not on file  Other Topics Concern   Not on file  Social History Narrative   Patient  is married [name]).   Patient has three children and 7 grandchildren.   Patient is retired.   Patient drinks one serving of coffee daily.   Patient is right-handed.   Patient has a college education.      PHYSICAL EXAM  Vitals:   04/15/19 1436  BP: (!) 150/86  Pulse: 89  Temp: (!) 97.5 F (36.4 C)  TempSrc: Oral   Weight: 218 lb (98.9 kg)  Height: 5\' 8"  (1.727 m)   Body mass index is 33.15 kg/m.  Generalized: Well developed, in no acute distress  Cardiology: normal rate and rhythm, no murmur noted Neurological examination  Mentation: Alert oriented to time, place, history taking. Follows all commands speech and language fluent Cranial nerve II-XII: Pupils were equal round reactive to light. Extraocular movements were full, visual field were full on confrontational test. Facial sensation and strength were normal. Uvula tongue midline. Head turning and shoulder shrug  were normal and symmetric. Motor: The motor testing reveals 5 over 5 strength of all 4 extremities. Good symmetric motor tone is noted throughout.  Sensory: Sensory testing is intact to soft touch on all 4 extremities. No evidence of extinction is noted.  Coordination: Cerebellar testing reveals good finger-nose-finger and heel-to-shin bilaterally.  Gait and station: Gait is normal. Tandem gait is normal. Romberg is negative. No drift is seen.  Reflexes: Deep tendon reflexes are symmetric and normal bilaterally.   DIAGNOSTIC DATA (LABS, IMAGING, TESTING) - I reviewed patient records, labs, notes, testing and imaging myself where available.  No flowsheet data found.   Lab Results  Component Value Date   WBC 9.1 09/09/2014   HGB 11.3 (L) 09/09/2014   HCT 33.2 (L) 09/09/2014   MCV 91.0 09/09/2014   PLT 371 09/09/2014      Component Value Date/Time   NA 131 (L) 09/09/2014 0431   K 4.9 09/09/2014 0431   CL 98 09/09/2014 0431   CO2 27 09/09/2014 0431   GLUCOSE 226 (H) 09/09/2014 0431   BUN 14 09/09/2014 0431   CREATININE 0.56 09/09/2014 0431   CREATININE 0.65 03/28/2014 1208   CALCIUM 8.6 09/09/2014 0431   PROT 6.8 04/14/2018 1632   ALBUMIN 3.6 09/07/2014 1350   AST 29 09/07/2014 1350   ALT 65 (H) 09/07/2014 1350   ALKPHOS 74 09/07/2014 1350   BILITOT 0.3 09/07/2014 1350   GFRNONAA >90 09/09/2014 0431   GFRAA >90  09/09/2014 0431   No results found for: CHOL, HDL, LDLCALC, LDLDIRECT, TRIG, CHOLHDL Lab Results  Component Value Date   HGBA1C 8.5 (H) 09/07/2014   Lab Results  Component Value Date   VITAMINB12 1,352 (H) 09/07/2014   Lab Results  Component Value Date   TSH 1.303 09/07/2014    ASSESSMENT AND PLAN 83 y.o. year old male  has a past medical history of Asthma, Bronchitis, COPD (chronic obstructive pulmonary disease) ([place]), Degenerative cervical disc, Diabetes mellitus, Diabetic peripheral neuropathy ([place]) (02/25/2018), Hematoma, Hypercholesterolemia, Hypertension, Iliac artery aneurysm ([place]), Lung nodule, Macular degeneration, Peripheral neuropathy, Progressive gait disorder, Progressive gait disorder (09/15/2013), Skull fracture ([place]), and Subdural hematoma ([place]). here with     ICD-10-CM   1. Neuropathy associated with endocrine disorder [facility])  E34.9    G63     Lelan Pons feels that he is doing very well overall.  He did not feel that B12 was beneficial.  He does continue multivitamin, folic acid and vitamin C.  He is not interested in additional medications to treat neuropathy at this time.  We will  continue close follow-up for worsening or any concerns with gait.  He will continue close follow-up with rheumatology as well for treatment of rheumatoid arthritis.  He will continue daily exercise regimen.  He will follow-up with [place] in 6 to 12 months, sooner if needed.  He verbalizes understanding and agreement with this plan.   No orders of the defined types were placed in this encounter.    No orders of the defined types were placed in this encounter.     I spent 15 minutes with the patient. 50% of this time was spent counseling and educating patient on plan of care and medications.    Debbora Presto, FNP-C 04/20/2019, 4:35 PM [facility] [address] [place], [place] [address] [phone]

## 2019-04-20 ENCOUNTER — Encounter: Payer: Self-pay | Admitting: Family Medicine

## 2019-04-20 DIAGNOSIS — Z23 Encounter for immunization: Secondary | ICD-10-CM | POA: Diagnosis not present

## 2019-04-26 DIAGNOSIS — M19049 Primary osteoarthritis, unspecified hand: Secondary | ICD-10-CM | POA: Diagnosis not present

## 2019-04-26 DIAGNOSIS — M1990 Unspecified osteoarthritis, unspecified site: Secondary | ICD-10-CM | POA: Diagnosis not present

## 2019-04-26 DIAGNOSIS — Z79899 Other long term (current) drug therapy: Secondary | ICD-10-CM | POA: Diagnosis not present

## 2019-04-26 DIAGNOSIS — G629 Polyneuropathy, unspecified: Secondary | ICD-10-CM | POA: Diagnosis not present

## 2019-04-26 DIAGNOSIS — E119 Type 2 diabetes mellitus without complications: Secondary | ICD-10-CM | POA: Diagnosis not present

## 2019-04-26 DIAGNOSIS — M069 Rheumatoid arthritis, unspecified: Secondary | ICD-10-CM | POA: Diagnosis not present

## 2019-04-26 DIAGNOSIS — J449 Chronic obstructive pulmonary disease, unspecified: Secondary | ICD-10-CM | POA: Diagnosis not present

## 2019-05-20 DIAGNOSIS — H10413 Chronic giant papillary conjunctivitis, bilateral: Secondary | ICD-10-CM | POA: Diagnosis not present

## 2019-05-27 DIAGNOSIS — D485 Neoplasm of uncertain behavior of skin: Secondary | ICD-10-CM | POA: Diagnosis not present

## 2019-05-27 DIAGNOSIS — Z85828 Personal history of other malignant neoplasm of skin: Secondary | ICD-10-CM | POA: Diagnosis not present

## 2019-05-27 DIAGNOSIS — D692 Other nonthrombocytopenic purpura: Secondary | ICD-10-CM | POA: Diagnosis not present

## 2019-05-27 DIAGNOSIS — D1801 Hemangioma of skin and subcutaneous tissue: Secondary | ICD-10-CM | POA: Diagnosis not present

## 2019-05-27 DIAGNOSIS — D225 Melanocytic nevi of trunk: Secondary | ICD-10-CM | POA: Diagnosis not present

## 2019-05-27 DIAGNOSIS — L821 Other seborrheic keratosis: Secondary | ICD-10-CM | POA: Diagnosis not present

## 2019-05-27 DIAGNOSIS — L570 Actinic keratosis: Secondary | ICD-10-CM | POA: Diagnosis not present

## 2019-06-01 DIAGNOSIS — E119 Type 2 diabetes mellitus without complications: Secondary | ICD-10-CM | POA: Diagnosis not present

## 2019-06-01 DIAGNOSIS — H2513 Age-related nuclear cataract, bilateral: Secondary | ICD-10-CM | POA: Diagnosis not present

## 2019-06-17 DIAGNOSIS — H2511 Age-related nuclear cataract, right eye: Secondary | ICD-10-CM | POA: Diagnosis not present

## 2019-06-17 DIAGNOSIS — H2181 Floppy iris syndrome: Secondary | ICD-10-CM | POA: Diagnosis not present

## 2019-06-17 DIAGNOSIS — H21561 Pupillary abnormality, right eye: Secondary | ICD-10-CM | POA: Diagnosis not present

## 2019-06-17 DIAGNOSIS — H25811 Combined forms of age-related cataract, right eye: Secondary | ICD-10-CM | POA: Diagnosis not present

## 2019-06-24 DIAGNOSIS — H25812 Combined forms of age-related cataract, left eye: Secondary | ICD-10-CM | POA: Diagnosis not present

## 2019-06-24 DIAGNOSIS — H21562 Pupillary abnormality, left eye: Secondary | ICD-10-CM | POA: Diagnosis not present

## 2019-06-24 DIAGNOSIS — H2512 Age-related nuclear cataract, left eye: Secondary | ICD-10-CM | POA: Diagnosis not present

## 2019-07-01 DIAGNOSIS — L988 Other specified disorders of the skin and subcutaneous tissue: Secondary | ICD-10-CM | POA: Diagnosis not present

## 2019-07-01 DIAGNOSIS — Z85828 Personal history of other malignant neoplasm of skin: Secondary | ICD-10-CM | POA: Diagnosis not present

## 2019-07-01 DIAGNOSIS — D485 Neoplasm of uncertain behavior of skin: Secondary | ICD-10-CM | POA: Diagnosis not present

## 2019-07-19 DIAGNOSIS — Z5181 Encounter for therapeutic drug level monitoring: Secondary | ICD-10-CM | POA: Diagnosis not present

## 2019-07-19 DIAGNOSIS — Z0000 Encounter for general adult medical examination without abnormal findings: Secondary | ICD-10-CM | POA: Diagnosis not present

## 2019-07-19 DIAGNOSIS — E1140 Type 2 diabetes mellitus with diabetic neuropathy, unspecified: Secondary | ICD-10-CM | POA: Diagnosis not present

## 2019-07-19 DIAGNOSIS — J449 Chronic obstructive pulmonary disease, unspecified: Secondary | ICD-10-CM | POA: Diagnosis not present

## 2019-07-19 DIAGNOSIS — I10 Essential (primary) hypertension: Secondary | ICD-10-CM | POA: Diagnosis not present

## 2019-07-19 DIAGNOSIS — E785 Hyperlipidemia, unspecified: Secondary | ICD-10-CM | POA: Diagnosis not present

## 2019-07-23 DIAGNOSIS — H353132 Nonexudative age-related macular degeneration, bilateral, intermediate dry stage: Secondary | ICD-10-CM | POA: Diagnosis not present

## 2019-07-27 DIAGNOSIS — G629 Polyneuropathy, unspecified: Secondary | ICD-10-CM | POA: Diagnosis not present

## 2019-07-27 DIAGNOSIS — M19049 Primary osteoarthritis, unspecified hand: Secondary | ICD-10-CM | POA: Diagnosis not present

## 2019-07-27 DIAGNOSIS — E119 Type 2 diabetes mellitus without complications: Secondary | ICD-10-CM | POA: Diagnosis not present

## 2019-07-27 DIAGNOSIS — Z79899 Other long term (current) drug therapy: Secondary | ICD-10-CM | POA: Diagnosis not present

## 2019-07-27 DIAGNOSIS — M1990 Unspecified osteoarthritis, unspecified site: Secondary | ICD-10-CM | POA: Diagnosis not present

## 2019-07-27 DIAGNOSIS — M069 Rheumatoid arthritis, unspecified: Secondary | ICD-10-CM | POA: Diagnosis not present

## 2019-07-27 DIAGNOSIS — J449 Chronic obstructive pulmonary disease, unspecified: Secondary | ICD-10-CM | POA: Diagnosis not present

## 2019-08-11 DIAGNOSIS — R350 Frequency of micturition: Secondary | ICD-10-CM | POA: Diagnosis not present

## 2019-08-11 DIAGNOSIS — N401 Enlarged prostate with lower urinary tract symptoms: Secondary | ICD-10-CM | POA: Diagnosis not present

## 2019-09-03 DIAGNOSIS — H5713 Ocular pain, bilateral: Secondary | ICD-10-CM | POA: Diagnosis not present

## 2019-09-07 DIAGNOSIS — R102 Pelvic and perineal pain: Secondary | ICD-10-CM | POA: Diagnosis not present

## 2019-09-07 DIAGNOSIS — R351 Nocturia: Secondary | ICD-10-CM | POA: Diagnosis not present

## 2019-09-07 DIAGNOSIS — N401 Enlarged prostate with lower urinary tract symptoms: Secondary | ICD-10-CM | POA: Diagnosis not present

## 2019-10-12 DIAGNOSIS — L0109 Other impetigo: Secondary | ICD-10-CM | POA: Diagnosis not present

## 2019-10-12 DIAGNOSIS — Z85828 Personal history of other malignant neoplasm of skin: Secondary | ICD-10-CM | POA: Diagnosis not present

## 2019-10-12 DIAGNOSIS — B029 Zoster without complications: Secondary | ICD-10-CM | POA: Diagnosis not present

## 2019-10-14 ENCOUNTER — Ambulatory Visit: Payer: PPO | Admitting: Family Medicine

## 2019-10-21 DIAGNOSIS — L84 Corns and callosities: Secondary | ICD-10-CM | POA: Diagnosis not present

## 2019-10-21 DIAGNOSIS — M2041 Other hammer toe(s) (acquired), right foot: Secondary | ICD-10-CM | POA: Diagnosis not present

## 2019-10-21 DIAGNOSIS — E1151 Type 2 diabetes mellitus with diabetic peripheral angiopathy without gangrene: Secondary | ICD-10-CM | POA: Diagnosis not present

## 2019-10-21 DIAGNOSIS — M21961 Unspecified acquired deformity of right lower leg: Secondary | ICD-10-CM | POA: Diagnosis not present

## 2019-10-21 DIAGNOSIS — L602 Onychogryphosis: Secondary | ICD-10-CM | POA: Diagnosis not present

## 2019-10-21 DIAGNOSIS — M2042 Other hammer toe(s) (acquired), left foot: Secondary | ICD-10-CM | POA: Diagnosis not present

## 2019-10-21 DIAGNOSIS — M21962 Unspecified acquired deformity of left lower leg: Secondary | ICD-10-CM | POA: Diagnosis not present

## 2019-10-25 DIAGNOSIS — Z79899 Other long term (current) drug therapy: Secondary | ICD-10-CM | POA: Diagnosis not present

## 2019-10-25 DIAGNOSIS — G629 Polyneuropathy, unspecified: Secondary | ICD-10-CM | POA: Diagnosis not present

## 2019-10-25 DIAGNOSIS — J449 Chronic obstructive pulmonary disease, unspecified: Secondary | ICD-10-CM | POA: Diagnosis not present

## 2019-10-25 DIAGNOSIS — M069 Rheumatoid arthritis, unspecified: Secondary | ICD-10-CM | POA: Diagnosis not present

## 2019-10-25 DIAGNOSIS — M19049 Primary osteoarthritis, unspecified hand: Secondary | ICD-10-CM | POA: Diagnosis not present

## 2019-10-25 DIAGNOSIS — E119 Type 2 diabetes mellitus without complications: Secondary | ICD-10-CM | POA: Diagnosis not present

## 2019-10-25 DIAGNOSIS — M1990 Unspecified osteoarthritis, unspecified site: Secondary | ICD-10-CM | POA: Diagnosis not present

## 2019-11-16 DIAGNOSIS — H5713 Ocular pain, bilateral: Secondary | ICD-10-CM | POA: Diagnosis not present

## 2019-11-17 DIAGNOSIS — R21 Rash and other nonspecific skin eruption: Secondary | ICD-10-CM | POA: Diagnosis not present

## 2019-11-17 DIAGNOSIS — L821 Other seborrheic keratosis: Secondary | ICD-10-CM | POA: Diagnosis not present

## 2019-11-17 DIAGNOSIS — Z85828 Personal history of other malignant neoplasm of skin: Secondary | ICD-10-CM | POA: Diagnosis not present

## 2019-12-01 ENCOUNTER — Ambulatory Visit: Payer: PPO | Admitting: Family Medicine

## 2019-12-08 ENCOUNTER — Telehealth: Payer: Self-pay | Admitting: Family Medicine

## 2019-12-08 NOTE — Telephone Encounter (Signed)
Pt called to report worsening neuropathy requested apt with MD for eval

## 2019-12-09 NOTE — Telephone Encounter (Signed)
I called pt and asked if he needed anything more from [place]?  He does have appt scheduled 12-16-19 with Dr. Brett Fairy for worsening neuropathy. He said no.

## 2019-12-10 DIAGNOSIS — H353131 Nonexudative age-related macular degeneration, bilateral, early dry stage: Secondary | ICD-10-CM | POA: Diagnosis not present

## 2019-12-10 DIAGNOSIS — H5203 Hypermetropia, bilateral: Secondary | ICD-10-CM | POA: Diagnosis not present

## 2019-12-10 DIAGNOSIS — E119 Type 2 diabetes mellitus without complications: Secondary | ICD-10-CM | POA: Diagnosis not present

## 2019-12-10 DIAGNOSIS — Z961 Presence of intraocular lens: Secondary | ICD-10-CM | POA: Diagnosis not present

## 2019-12-16 ENCOUNTER — Ambulatory Visit: Payer: PPO | Admitting: Neurology

## 2020-01-06 DIAGNOSIS — J45909 Unspecified asthma, uncomplicated: Secondary | ICD-10-CM | POA: Diagnosis not present

## 2020-01-06 DIAGNOSIS — E1140 Type 2 diabetes mellitus with diabetic neuropathy, unspecified: Secondary | ICD-10-CM | POA: Diagnosis not present

## 2020-02-19 ENCOUNTER — Encounter (HOSPITAL_COMMUNITY): Payer: Self-pay | Admitting: Emergency Medicine

## 2020-02-19 ENCOUNTER — Emergency Department (HOSPITAL_COMMUNITY): Payer: PPO

## 2020-02-19 ENCOUNTER — Other Ambulatory Visit: Payer: Self-pay

## 2020-02-19 ENCOUNTER — Emergency Department (HOSPITAL_COMMUNITY)
Admission: EM | Admit: 2020-02-19 | Discharge: 2020-02-20 | Disposition: A | Discharge status: Home / Self Care | Payer: PPO | Attending: Emergency Medicine | Admitting: Emergency Medicine

## 2020-02-19 DIAGNOSIS — I10 Essential (primary) hypertension: Secondary | ICD-10-CM | POA: Insufficient documentation

## 2020-02-19 DIAGNOSIS — R05 Cough: Secondary | ICD-10-CM | POA: Insufficient documentation

## 2020-02-19 DIAGNOSIS — Z79899 Other long term (current) drug therapy: Secondary | ICD-10-CM | POA: Insufficient documentation

## 2020-02-19 DIAGNOSIS — Z87891 Personal history of nicotine dependence: Secondary | ICD-10-CM | POA: Diagnosis not present

## 2020-02-19 DIAGNOSIS — R519 Headache, unspecified: Secondary | ICD-10-CM | POA: Diagnosis not present

## 2020-02-19 DIAGNOSIS — Z7982 Long term (current) use of aspirin: Secondary | ICD-10-CM | POA: Insufficient documentation

## 2020-02-19 DIAGNOSIS — J449 Chronic obstructive pulmonary disease, unspecified: Secondary | ICD-10-CM | POA: Insufficient documentation

## 2020-02-19 DIAGNOSIS — Z7951 Long term (current) use of inhaled steroids: Secondary | ICD-10-CM | POA: Insufficient documentation

## 2020-02-19 DIAGNOSIS — Z7984 Long term (current) use of oral hypoglycemic drugs: Secondary | ICD-10-CM | POA: Insufficient documentation

## 2020-02-19 DIAGNOSIS — E1140 Type 2 diabetes mellitus with diabetic neuropathy, unspecified: Secondary | ICD-10-CM | POA: Insufficient documentation

## 2020-02-19 DIAGNOSIS — J984 Other disorders of lung: Secondary | ICD-10-CM | POA: Diagnosis not present

## 2020-02-19 NOTE — ED Triage Notes (Signed)
Patient reports elevated BP since yesterday while measuring BP at home. States sent from [facility] for further evaluation as systolic was >977. Patient denies complaints. Reports taking losartan as prescribed. 180/104 in triage.

## 2020-02-19 NOTE — ED Provider Notes (Signed)
[facility] DEPT Provider Note   CSN: [id number] Arrival date & time: 02/19/20  1651     History Chief Complaint  Patient presents with  . Hypertension    [name] is a 84 y.o. male with a past medical history of DM, COPD, peripheral neuropathy, who presents today for evaluation of hypertension.  He reports compliance with his losartan and all of his medications at home, has not missed any doses in the past few weeks.  He states that he went to [facility] and as his systolic was over 546 they sent him to the emergency room for further evaluation.  He states that he had been measuring his blood pressure at home however otherwise feels at his baseline.  He did develop a headache after being seen at [facility], denies any weakness, numbness, difficulty speaking.  He denies any fall or trauma.  He does state that he takes Mucinex D twice a day.  He reports he has a chronic cough that he does not feel is significantly changed.  No chest or abdominal pain.  HPI     Past Medical History:  Diagnosis Date  . Asthma   . Bronchitis   . COPD (chronic obstructive pulmonary disease) ([place])   . Degenerative cervical disc   . Diabetes mellitus   . Diabetic peripheral neuropathy ([place]) 02/25/2018  . Hematoma    left subdoral  . Hypercholesterolemia   . Hypertension   . Iliac artery aneurysm ([place])   . Lung nodule   . Macular degeneration   . Peripheral neuropathy   . Progressive gait disorder   . 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 Skull fracture ([place])   . Subdural hematoma [facility])     Patient Active Problem List   Diagnosis Date Noted  . Neuropathy associated with endocrine disorder ([place]) 11/01/2018  . Tension-type headache, not intractable 04/14/2018  . Diabetic peripheral neuropathy ([place]) 02/25/2018  . Morbid obesity ([place]) 04/23/2015  . Upper airway cough syndrome 04/20/2015  . Essential hypertension 09/13/2014  . COPD GOLD II 09/07/2014  .  Leukocytosis 09/07/2014  . Anemia 09/07/2014  . Chest tightness 08/26/2014  . Diabetes mellitus type 2 with complications ([place]) 50/35/4656  . Chronic obstructive pulmonary disease with acute exacerbation ([place]) 04/06/2014  . Hyponatremia 04/06/2014  . Hyperlipidemia 04/06/2014  . CAP (community acquired pneumonia) 04/05/2014  . Balance problem 02/15/2014  . Benign paroxysmal positional vertigo 02/15/2014  . Chronic tension-type headache, not intractable 02/15/2014  . 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 Chronic tension headaches 09/15/2013  . 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 Iliac aneurysm ([place]) 03/17/2012    History reviewed. No pertinent surgical history.     Family History  Problem Relation Age of Onset  . Stroke Father     Social History   Tobacco Use  . Smoking status: Former Smoker    Packs/day: 0.50    Years: 60.00    Pack years: 30.00    Types: Cigarettes    Quit date: 04/07/2014    Years since quitting: 5.8  . Smokeless tobacco: Never Used  Substance Use Topics  . Alcohol use: Yes    Alcohol/week: 14.0 standard drinks    Types: 14 Standard drinks or equivalent per week    Comment: 1-2 before dinner  . Drug use: No    Home Medications Prior to Admission medications   Medication Sig Start Date End Date Taking? Authorizing Provider  albuterol (PROVENTIL HFA;VENTOLIN HFA) 108 (90 BASE) MCG/ACT inhaler Inhale  2 puffs into the lungs every 6 (six) hours as needed for wheezing or shortness of breath.   Yes [provider]  Ascorbic Acid (VITAMIN C PO) Take 1 tablet by mouth daily.   Yes [provider]  aspirin 81 MG tablet Take 81 mg by mouth daily.   Yes [provider]  cetirizine (ZYRTEC) 10 MG tablet Take 10 mg by mouth daily.   Yes [provider]  famotidine (PEPCID) 20 MG tablet One at bedtime Patient taking differently: Take 20 mg by mouth at bedtime.  04/08/16  Yes Tanda Rockers, MD  finasteride (PROSCAR)  5 MG tablet Take 5 mg by mouth daily.   Yes [provider]  folic acid (FOLVITE) 1 MG tablet Take 1 mg by mouth daily.   Yes [provider]  gabapentin (NEURONTIN) 100 MG capsule Take 100 mg by mouth 3 (three) times daily. 01/06/20  Yes [provider]  glipiZIDE (GLUCOTROL XL) 10 MG 24 hr tablet Take 10 mg by mouth daily. 01/23/20  Yes [provider]  losartan (COZAAR) 100 MG tablet Take 100 mg by mouth daily. 03/20/19  Yes [provider]  metFORMIN (GLUCOPHAGE) 500 MG tablet Take 1 tablet (500 mg total) by mouth 2 (two) times daily with a meal. Resume tonight. 09/09/14  Yes Bonnielee Haff, MD  Multiple Vitamin (MULTIVITAMIN WITH MINERALS) TABS Take 1 tablet by mouth daily.   Yes [provider]  omeprazole (PRILOSEC) 40 MG capsule Take 40 mg by mouth daily.   Yes [provider]  ONE TOUCH ULTRA TEST test strip  09/28/15  Yes [provider]  Jonetta Speak LANCETS [address] [place]  09/28/15  Yes [provider]  SYMBICORT 160-4.5 MCG/ACT inhaler INHALE 2 PUFFS INTO THE LUNGS 2 TIMES DAILY Patient taking differently: Inhale 2 puffs into the lungs 2 (two) times daily.  11/04/16  Yes Tanda Rockers, MD  Tamsulosin HCl (FLOMAX) 0.4 MG CAPS Take 0.4 mg by mouth daily.   Yes [provider]  methotrexate 2.5 MG tablet Take 2.5 mg by mouth once a week. Take 6 tablets orally once a week    [provider]    Allergies    Lisinopril, Pneumovax 23 [pneumococcal vac polyvalent], Prednisone, Prevnar 13 [pneumococcal 13-val conj vacc], Penicillins, and Sulfa antibiotics  Review of Systems   Review of Systems  Constitutional: Negative for chills and fever.  Eyes: Negative for visual disturbance.  Respiratory: Positive for cough. Negative for chest tightness and shortness of breath.   Cardiovascular: Negative for chest pain.  Gastrointestinal: Negative for abdominal pain, diarrhea, nausea and vomiting.    Musculoskeletal: Negative for back pain.  Skin: Negative for color change, rash and wound.  Neurological: Positive for headaches. Negative for dizziness, seizures, syncope, facial asymmetry, speech difficulty and weakness.  All other systems reviewed and are negative.   Physical Exam Updated Vital Signs BP (!) 161/87   Pulse 93   Temp 98.8 F (37.1 C)   Resp 16   Ht 5\' 8"  (1.727 m)   Wt 98.2 kg   SpO2 96%   BMI 32.90 kg/m   Physical Exam Vitals and nursing note reviewed.  Constitutional:      General: He is not in acute distress.    Appearance: He is well-developed. He is not diaphoretic.  HENT:     Head: Normocephalic and atraumatic.  Eyes:     General: No scleral icterus.       Right eye: No  discharge.        Left eye: No discharge.     Conjunctiva/sclera: Conjunctivae normal.  Cardiovascular:     Rate and Rhythm: Normal rate and regular rhythm.     Pulses: Normal pulses.     Heart sounds: Normal heart sounds. No murmur heard.   Pulmonary:     Effort: Pulmonary effort is normal. No respiratory distress.     Breath sounds: Normal breath sounds. No stridor.     Comments: Wet sounding cough Abdominal:     General: There is no distension.     Palpations: Abdomen is soft.     Tenderness: There is no abdominal tenderness. There is no guarding.  Musculoskeletal:        General: No deformity.     Cervical back: Normal range of motion and neck supple. No rigidity.  Skin:    General: Skin is warm and dry.  Neurological:     General: No focal deficit present.     Mental Status: He is alert and oriented to person, place, and time.     Cranial Nerves: No cranial nerve deficit.     Motor: No weakness or abnormal muscle tone.  Psychiatric:        Mood and Affect: Mood normal.        Behavior: Behavior normal.     ED Results / Procedures / Treatments   Labs (all labs ordered are listed, but only abnormal results are displayed) Labs Reviewed  BASIC METABOLIC PANEL -  Abnormal; Notable for the following components:      Result Value   Sodium 131 (*)    Chloride 93 (*)    Glucose, Bld 159 (*)    Creatinine, Ser 0.55 (*)    All other components within normal limits  CBC WITH DIFFERENTIAL/PLATELET - Abnormal; Notable for the following components:   RBC 4.09 (*)    HCT 37.8 (*)    Neutro Abs 8.0 (*)    All other components within normal limits    EKG None  Radiology DG Chest 2 View  Result Date: 02/19/2020 CLINICAL DATA:  Chest congestion EXAM: CHEST - 2 VIEW COMPARISON:  02/16/2016 FINDINGS: There is hyperinflation of the lungs compatible with COPD. Scarring in the lingula. Right lung clear. Heart is normal size. No effusions. No acute bony abnormality. IMPRESSION: COPD/chronic changes.  No active disease. Electronically Signed   By: Rolm Baptise M.D.   On: 02/19/2020 23:28   CT Head Wo Contrast  Result Date: 02/19/2020 CLINICAL DATA:  Headache EXAM: CT HEAD WITHOUT CONTRAST TECHNIQUE: Contiguous axial images were obtained from the base of the skull through the vertex without intravenous contrast. COMPARISON:  03/09/2008 FINDINGS: Brain: There is atrophy and chronic small vessel disease changes. No acute intracranial abnormality. Specifically, no hemorrhage, hydrocephalus, mass lesion, acute infarction, or significant intracranial injury. Old infarct in the left temporal lobe. Vascular: No hyperdense vessel or unexpected calcification. Skull: No acute calvarial abnormality. Sinuses/Orbits: Visualized paranasal sinuses and mastoids clear. Orbital soft tissues unremarkable. 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 Atrophy, chronic microvascular disease. No acute intracranial abnormality. Electronically Signed   By: Rolm Baptise M.D.   On: 02/19/2020 23:29    Procedures Procedures (including critical care time)  Medications Ordered in ED Medications  acetaminophen (TYLENOL) tablet 650 mg (650 mg Oral Given 02/20/20 0325)    ED Course  I have  reviewed the triage vital signs and the nursing notes.  Pertinent labs & imaging results that were  available during my care of the patient were reviewed by me and considered in my medical decision making (see chart for details).    MDM Rules/Calculators/A&P                         Patient is an 84 year old man who presents today for evaluation of hypertension.  This was discovered after he had been checking his blood pressures at home and found that they were elevated.  He went to an urgent care type center who sent him here based on his systolic being over 361.  He is compliant with his home medications.  He has developed a mild headache since being seen at the urgent care however denies any other acute complaints or concerns. He states he takes Mucinex D twice a day however this is not new.  We discussed that he needs to stop taking any decongesting medications as this can contribute to hypertension. Based on his headache CT head was obtained without evidence of intracranial hemorrhage or other abnormality.  Chest x-ray was obtained showing COPD without evidence of acute changes.    CBC without significant abnormalities.  BMP does show mild hyponatremia at 131 however I do not think that this is clinically significant today.    We discussed the importance of not taking decongestant or other stimulant type medications.  His blood pressure improved down to 161/87 while in the emergency room.  No evidence of hypertensive emergency or urgency.  Return to PCP for continued blood pressure monitoring and medication adjustment.  DASH diet.   Return precautions were discussed with patient who states their understanding.  At the time of discharge patient denied any unaddressed complaints or concerns.  Patient is agreeable for discharge home.  Note: Portions of this report may have been transcribed using voice recognition software. Every effort was made to ensure accuracy; however, inadvertent computerized  transcription errors may be present   Final Clinical Impression(s) / ED Diagnoses Final diagnoses:  Hypertension, unspecified type    Rx / DC Orders ED Discharge Orders    None       Lorin Glass, PA-C 02/20/20 0800    Lucrezia Starch, MD 02/21/20 1626

## 2020-02-20 LAB — CBC WITH DIFFERENTIAL/PLATELET
Abs Immature Granulocytes: 0.03 10*3/uL (ref 0.00–0.07)
Basophils Absolute: 0.1 10*3/uL (ref 0.0–0.1)
Basophils Relative: 1 %
Eosinophils Absolute: 0.1 10*3/uL (ref 0.0–0.5)
Eosinophils Relative: 1 %
HCT: 37.8 % — ABNORMAL LOW (ref 39.0–52.0)
Hemoglobin: 13.1 g/dL (ref 13.0–17.0)
Immature Granulocytes: 0 %
Lymphocytes Relative: 14 %
Lymphs Abs: 1.5 10*3/uL (ref 0.7–4.0)
MCH: 32 pg (ref 26.0–34.0)
MCHC: 34.7 g/dL (ref 30.0–36.0)
MCV: 92.4 fL (ref 80.0–100.0)
Monocytes Absolute: 0.9 10*3/uL (ref 0.1–1.0)
Monocytes Relative: 8 %
Neutro Abs: 8 10*3/uL — ABNORMAL HIGH (ref 1.7–7.7)
Neutrophils Relative %: 76 %
Platelets: 257 10*3/uL (ref 150–400)
RBC: 4.09 MIL/uL — ABNORMAL LOW (ref 4.22–5.81)
RDW: 14.8 % (ref 11.5–15.5)
WBC: 10.5 10*3/uL (ref 4.0–10.5)
nRBC: 0 % (ref 0.0–0.2)

## 2020-02-20 LAB — BASIC METABOLIC PANEL
Anion gap: 14 (ref 5–15)
BUN: 13 mg/dL (ref 8–23)
CO2: 24 mmol/L (ref 22–32)
Calcium: 9.3 mg/dL (ref 8.9–10.3)
Chloride: 93 mmol/L — ABNORMAL LOW (ref 98–111)
Creatinine, Ser: 0.55 mg/dL — ABNORMAL LOW (ref 0.61–1.24)
GFR calc Af Amer: >60 mL/min (ref >60)
GFR calc non Af Amer: >60 mL/min (ref >60)
Glucose, Bld: 159 mg/dL — ABNORMAL HIGH (ref 70–99)
Potassium: 4.3 mmol/L (ref 3.5–5.1)
Sodium: 131 mmol/L — ABNORMAL LOW (ref 135–145)

## 2020-02-20 MED ORDER — ACETAMINOPHEN 325 MG PO TABS
650.0000 mg | ORAL_TABLET | Freq: Once | ORAL | Status: AC
  Administered 2020-02-20: 650 mg via ORAL
  Filled 2020-02-20: qty 2

## 2020-02-20 NOTE — Discharge Instructions (Signed)
Your blood pressure was high today.  Please stop taking mucinex D.  You may take normal Mucinex, however any decongestant medications are going to elevate your blood pressure. Please schedule a follow-up appointment with your primary care doctor.

## 2020-02-23 DIAGNOSIS — I10 Essential (primary) hypertension: Secondary | ICD-10-CM | POA: Diagnosis not present

## 2020-02-23 DIAGNOSIS — J449 Chronic obstructive pulmonary disease, unspecified: Secondary | ICD-10-CM | POA: Diagnosis not present

## 2020-03-28 DIAGNOSIS — M069 Rheumatoid arthritis, unspecified: Secondary | ICD-10-CM | POA: Diagnosis not present

## 2020-03-28 DIAGNOSIS — M19049 Primary osteoarthritis, unspecified hand: Secondary | ICD-10-CM | POA: Diagnosis not present

## 2020-03-28 DIAGNOSIS — E119 Type 2 diabetes mellitus without complications: Secondary | ICD-10-CM | POA: Diagnosis not present

## 2020-03-28 DIAGNOSIS — J449 Chronic obstructive pulmonary disease, unspecified: Secondary | ICD-10-CM | POA: Diagnosis not present

## 2020-03-28 DIAGNOSIS — M1990 Unspecified osteoarthritis, unspecified site: Secondary | ICD-10-CM | POA: Diagnosis not present

## 2020-03-28 DIAGNOSIS — G629 Polyneuropathy, unspecified: Secondary | ICD-10-CM | POA: Diagnosis not present

## 2020-03-28 DIAGNOSIS — Z79899 Other long term (current) drug therapy: Secondary | ICD-10-CM | POA: Diagnosis not present

## 2020-04-04 DIAGNOSIS — Z23 Encounter for immunization: Secondary | ICD-10-CM | POA: Diagnosis not present

## 2020-04-04 DIAGNOSIS — J449 Chronic obstructive pulmonary disease, unspecified: Secondary | ICD-10-CM | POA: Diagnosis not present

## 2020-04-04 DIAGNOSIS — M069 Rheumatoid arthritis, unspecified: Secondary | ICD-10-CM | POA: Diagnosis not present

## 2020-04-04 DIAGNOSIS — M1990 Unspecified osteoarthritis, unspecified site: Secondary | ICD-10-CM | POA: Diagnosis not present

## 2020-05-16 DIAGNOSIS — H5713 Ocular pain, bilateral: Secondary | ICD-10-CM | POA: Diagnosis not present

## 2020-05-23 ENCOUNTER — Ambulatory Visit: Payer: PPO | Admitting: Family Medicine

## 2020-05-23 NOTE — Progress Notes (Deleted)
No chief complaint on file.    HISTORY OF PRESENT ILLNESS: Today 05/23/20  [name] is a 84 y.o. male here today for follow up for peripheral neuropathy.  Gabapentin 100mg  TID    HISTORY (copied from my note note on 04/15/2019)  [name] is a 84 y.o. male here today for follow up for neuropathy.  He feels that he is doing quite well.   He is present today with his wife who agrees.  He does continue to have some heaviness in his feet.  He is able to exercise daily.  He walks and rides a stationary bike.  He continues close follow-up with rheumatology on methotrexate and prednisone.  He is not interested in taking medications to help with neuropathy at this time.  He feels that overall he is doing well and without concerns today.    REVIEW OF SYSTEMS: Out of a complete 14 system review of symptoms, the patient complains only of the following symptoms, and all other reviewed systems are negative.   ALLERGIES: Allergies  Allergen Reactions  . Lisinopril Cough  . Pneumovax 23 [Pneumococcal Vac Polyvalent]     Fever, sweats  . Prednisone Other (See Comments)    Sweating with high dose  . Prevnar 13 [Pneumococcal 13-Val Conj Vacc] Swelling  . Penicillins Rash  . Sulfa Antibiotics Rash     HOME MEDICATIONS: Outpatient Medications Prior to Visit  Medication Sig Dispense Refill  . albuterol (PROVENTIL HFA;VENTOLIN HFA) 108 (90 BASE) MCG/ACT inhaler Inhale 2 puffs into the lungs every 6 (six) hours as needed for wheezing or shortness of breath.    . Ascorbic Acid (VITAMIN C PO) Take 1 tablet by mouth daily.    Marland Kitchen aspirin 81 MG tablet Take 81 mg by mouth daily.    . cetirizine (ZYRTEC) 10 MG tablet Take 10 mg by mouth daily.    . famotidine (PEPCID) 20 MG tablet One at bedtime (Patient taking differently: Take 20 mg by mouth at bedtime. )    . finasteride (PROSCAR) 5 MG tablet Take 5 mg by mouth daily.    . folic acid (FOLVITE) 1 MG tablet Take 1 mg by mouth daily.     Marland Kitchen gabapentin (NEURONTIN) 100 MG capsule Take 100 mg by mouth 3 (three) times daily.    Marland Kitchen glipiZIDE (GLUCOTROL XL) 10 MG 24 hr tablet Take 10 mg by mouth daily.    Marland Kitchen losartan (COZAAR) 100 MG tablet Take 100 mg by mouth daily.    . metFORMIN (GLUCOPHAGE) 500 MG tablet Take 1 tablet (500 mg total) by mouth 2 (two) times daily with a meal. Resume tonight.    . methotrexate 2.5 MG tablet Take 2.5 mg by mouth once a week. Take 6 tablets orally once a week    . Multiple Vitamin (MULTIVITAMIN WITH MINERALS) TABS Take 1 tablet by mouth daily.    Marland Kitchen omeprazole (PRILOSEC) 40 MG capsule Take 40 mg by mouth daily.    . ONE TOUCH ULTRA TEST test strip     . ONETOUCH DELICA LANCETS 10G MISC     . SYMBICORT 160-4.5 MCG/ACT inhaler INHALE 2 PUFFS INTO THE LUNGS 2 TIMES DAILY (Patient taking differently: Inhale 2 puffs into the lungs 2 (two) times daily. ) 1 Inhaler 11  . Tamsulosin HCl (FLOMAX) 0.4 MG CAPS Take 0.4 mg by mouth daily.     No facility-administered medications prior to visit.     PAST MEDICAL HISTORY: Past Medical History:  Diagnosis Date  .  Asthma   . Bronchitis   . COPD (chronic obstructive pulmonary disease) ([place])   . Degenerative cervical disc   . Diabetes mellitus   . Diabetic peripheral neuropathy ([place]) 02/25/2018  . Hematoma    left subdoral  . Hypercholesterolemia   . Hypertension   . Iliac artery aneurysm ([place])   . Lung nodule   . Macular degeneration   . Peripheral neuropathy   . Progressive gait disorder   . 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 Skull fracture ([place])   . Subdural hematoma ([facility])      PAST SURGICAL HISTORY: No past surgical history on file.   FAMILY HISTORY: Family History  Problem Relation Age of Onset  . Stroke Father      SOCIAL HISTORY: Social History   Socioeconomic History  . Marital status: Married    Spouse name: [name]  . Number of children: 3  . Years of education: 63  . Highest education level: Not on file  Occupational  History  . Occupation: Retired  Tobacco Use  . Smoking status: Former Smoker    Packs/day: 0.50    Years: 60.00    Pack years: 30.00    Types: Cigarettes    Quit date: 04/07/2014    Years since quitting: 6.1  . Smokeless tobacco: Never Used  Substance and Sexual Activity  . Alcohol use: Yes    Alcohol/week: 14.0 standard drinks    Types: 14 Standard drinks or equivalent per week    Comment: 1-2 before dinner  . Drug use: No  . Sexual activity: Not on file  Other Topics Concern  . Not on file  Social History Narrative   Patient is married [name]).   Patient has three children and 7 grandchildren.   Patient is retired.   Patient drinks one serving of coffee daily.   Patient is right-handed.   Patient has a college education.   Social Determinants of Health   Financial Resource Strain:   . Difficulty of Paying Living Expenses: Not on file  Food Insecurity:   . Worried About Charity fundraiser in the Last Year: Not on file  . Ran Out of Food in the Last Year: Not on file  Transportation Needs:   . Lack of Transportation (Medical): Not on file  . Lack of Transportation (Non-Medical): Not on file  Physical Activity:   . Days of Exercise per Week: Not on file  . Minutes of Exercise per Session: Not on file  Stress:   . Feeling of Stress : Not on file  Social Connections:   . Frequency of Communication with Friends and Family: Not on file  . Frequency of Social Gatherings with Friends and Family: Not on file  . Attends Religious Services: Not on file  . Active Member of Clubs or Organizations: Not on file  . Attends Archivist Meetings: Not on file  . Marital Status: Not on file  Intimate Partner Violence:   . Fear of Current or Ex-Partner: Not on file  . Emotionally Abused: Not on file  . Physically Abused: Not on file  . Sexually Abused: Not on file      PHYSICAL EXAM  There were no vitals filed for this visit. There is no height or weight on file to  calculate BMI.   Generalized: Well developed, in no acute distress   Neurological examination  Mentation: Alert oriented to time, place, history taking. Follows all commands speech and language fluent Cranial nerve II-XII: Pupils  were equal round reactive to light. Extraocular movements were full, visual field were full on confrontational test. Facial sensation and strength were normal. Uvula tongue midline. Head turning and shoulder shrug  were normal and symmetric. Motor: The motor testing reveals 5 over 5 strength of all 4 extremities. Good symmetric motor tone is noted throughout.  Sensory: Sensory testing is intact to soft touch on all 4 extremities. No evidence of extinction is noted.  Coordination: Cerebellar testing reveals good finger-nose-finger and heel-to-shin bilaterally.  Gait and station: Gait is normal. Tandem gait is normal. Romberg is negative. No drift is seen.  Reflexes: Deep tendon reflexes are symmetric and normal bilaterally.     DIAGNOSTIC DATA (LABS, IMAGING, TESTING) - I reviewed patient records, labs, notes, testing and imaging myself where available.  Lab Results  Component Value Date   WBC 10.5 02/20/2020   HGB 13.1 02/20/2020   HCT 37.8 (L) 02/20/2020   MCV 92.4 02/20/2020   PLT 257 02/20/2020      Component Value Date/Time   NA 131 (L) 02/20/2020 0013   K 4.3 02/20/2020 0013   CL 93 (L) 02/20/2020 0013   CO2 24 02/20/2020 0013   GLUCOSE 159 (H) 02/20/2020 0013   BUN 13 02/20/2020 0013   CREATININE 0.55 (L) 02/20/2020 0013   CREATININE 0.65 03/28/2014 1208   CALCIUM 9.3 02/20/2020 0013   PROT 6.8 04/14/2018 1632   ALBUMIN 3.6 09/07/2014 1350   AST 29 09/07/2014 1350   ALT 65 (H) 09/07/2014 1350   ALKPHOS 74 09/07/2014 1350   BILITOT 0.3 09/07/2014 1350   GFRNONAA >60 02/20/2020 0013   GFRAA >60 02/20/2020 0013   No results found for: CHOL, HDL, LDLCALC, LDLDIRECT, TRIG, CHOLHDL Lab Results  Component Value Date   HGBA1C 8.5 (H)  09/07/2014   Lab Results  Component Value Date   VITAMINB12 1,352 (H) 09/07/2014   Lab Results  Component Value Date   TSH 1.303 09/07/2014      ASSESSMENT AND PLAN  84 y.o. year old male  has a past medical history of Asthma, Bronchitis, COPD (chronic obstructive pulmonary disease) ([place]), Degenerative cervical disc, Diabetes mellitus, Diabetic peripheral neuropathy ([place]) (02/25/2018), Hematoma, Hypercholesterolemia, Hypertension, Iliac artery aneurysm ([place]), Lung nodule, Macular degeneration, Peripheral neuropathy, Progressive gait disorder, Progressive gait disorder (09/15/2013), Skull fracture ([place]), and Subdural hematoma ([place]). here with ***  Neuropathy associated with endocrine disorder ([place])   I spent 20 minutes of face-to-face and non-face-to-face time with patient.  This included previsit chart review, lab review, study review, order entry, electronic health record documentation, patient education.    Debbora Presto, MSN, FNP-C 05/23/2020, 7:23 AM  [facility] [address], [place], [place] [address] [phone]

## 2020-05-24 ENCOUNTER — Encounter: Payer: Self-pay | Admitting: Family Medicine

## 2020-05-24 ENCOUNTER — Ambulatory Visit: Payer: PPO | Admitting: Family Medicine

## 2020-05-24 VITALS — BP 135/69 | HR 89 | Ht 70.0 in | Wt 220.0 lb

## 2020-05-24 DIAGNOSIS — E349 Endocrine disorder, unspecified: Secondary | ICD-10-CM

## 2020-05-24 DIAGNOSIS — G63 Polyneuropathy in diseases classified elsewhere: Secondary | ICD-10-CM | POA: Diagnosis not present

## 2020-05-24 NOTE — Progress Notes (Signed)
Chief Complaint  Patient presents with  . Follow-up    rm 2     HISTORY OF PRESENT ILLNESS: Today 05/24/20  [name] is a 84 y.o. male here today for follow up for axonal peripheral neuropathy. He was prescribed gabapentin 100mg  TID by PCP. He took 1 dose and did not feel much benefit so he did not continue medication. He reports that neuropathy is fairly stable. He does have waxing and waning symptoms. He denies pain but described an uncomfortable numbness sensation. He does feel a little off balance from time to time. No falls. He uses a cane. He does not feel PT would be beneficial at this time. He continues to follow up with his care team regularly. A1C was 7.1. he is working to control BP. He is exercising daily on his stationary bike. He tries to stay active.    HISTORY (copied from my note on 04/20/2019)  [name] is a 84 y.o. male here today for follow up for neuropathy.  He feels that he is doing quite well.   He is present today with his wife who agrees.  He does continue to have some heaviness in his feet.  He is able to exercise daily.  He walks and rides a stationary bike.  He continues close follow-up with rheumatology on methotrexate and prednisone.  He is not interested in taking medications to help with neuropathy at this time.  He feels that overall he is doing well and without concerns today.    REVIEW OF SYSTEMS: Out of a complete 14 system review of symptoms, the patient complains only of the following symptoms, neuropathy and all other reviewed systems are negative.   ALLERGIES: Allergies  Allergen Reactions  . Lisinopril Cough  . Pneumovax 23 [Pneumococcal Vac Polyvalent]     Fever, sweats  . Prednisone Other (See Comments)    Sweating with high dose  . Prevnar 13 [Pneumococcal 13-Val Conj Vacc] Swelling  . Penicillins Rash  . Sulfa Antibiotics Rash     HOME MEDICATIONS: Outpatient Medications Prior to Visit  Medication Sig Dispense Refill   . albuterol (PROVENTIL HFA;VENTOLIN HFA) 108 (90 BASE) MCG/ACT inhaler Inhale 2 puffs into the lungs every 6 (six) hours as needed for wheezing or shortness of breath.    . Ascorbic Acid (VITAMIN C PO) Take 1 tablet by mouth daily.    Marland Kitchen aspirin 81 MG tablet Take 81 mg by mouth daily.    . cetirizine (ZYRTEC) 10 MG tablet Take 10 mg by mouth daily.    . famotidine (PEPCID) 20 MG tablet One at bedtime (Patient taking differently: Take 20 mg by mouth at bedtime. )    . finasteride (PROSCAR) 5 MG tablet Take 5 mg by mouth daily.    . folic acid (FOLVITE) 1 MG tablet Take 1 mg by mouth daily.    Marland Kitchen gabapentin (NEURONTIN) 100 MG capsule Take 100 mg by mouth 3 (three) times daily.    Marland Kitchen glipiZIDE (GLUCOTROL XL) 10 MG 24 hr tablet Take 10 mg by mouth daily.    Marland Kitchen losartan (COZAAR) 100 MG tablet Take 100 mg by mouth daily.    . metFORMIN (GLUCOPHAGE) 500 MG tablet Take 1 tablet (500 mg total) by mouth 2 (two) times daily with a meal. Resume tonight.    . methotrexate 2.5 MG tablet Take 2.5 mg by mouth once a week. Take 6 tablets orally once a week    . Multiple Vitamin (MULTIVITAMIN WITH MINERALS) TABS  Take 1 tablet by mouth daily.    Marland Kitchen omeprazole (PRILOSEC) 40 MG capsule Take 40 mg by mouth daily.    . ONE TOUCH ULTRA TEST test strip     . ONETOUCH DELICA LANCETS 24P MISC     . SYMBICORT 160-4.5 MCG/ACT inhaler INHALE 2 PUFFS INTO THE LUNGS 2 TIMES DAILY (Patient taking differently: Inhale 2 puffs into the lungs 2 (two) times daily. ) 1 Inhaler 11  . Tamsulosin HCl (FLOMAX) 0.4 MG CAPS Take 0.4 mg by mouth daily.     No facility-administered medications prior to visit.     PAST MEDICAL HISTORY: Past Medical History:  Diagnosis Date  . Asthma   . Bronchitis   . COPD (chronic obstructive pulmonary disease) ([place])   . Degenerative cervical disc   . Diabetes mellitus   . Diabetic peripheral neuropathy ([place]) 02/25/2018  . Hematoma    left subdoral  . Hypercholesterolemia   . Hypertension   .  Iliac artery aneurysm ([place])   . Lung nodule   . Macular degeneration   . Peripheral neuropathy   . Progressive gait disorder   . 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 Skull fracture ([place])   . Subdural hematoma ([facility])      PAST SURGICAL HISTORY: No past surgical history on file.   FAMILY HISTORY: Family History  Problem Relation Age of Onset  . Stroke Father      SOCIAL HISTORY: Social History   Socioeconomic History  . Marital status: Married    Spouse name: [name]  . Number of children: 3  . Years of education: 63  . Highest education level: Not on file  Occupational History  . Occupation: Retired  Tobacco Use  . Smoking status: Former Smoker    Packs/day: 0.50    Years: 60.00    Pack years: 30.00    Types: Cigarettes    Quit date: 04/07/2014    Years since quitting: 6.1  . Smokeless tobacco: Never Used  Substance and Sexual Activity  . Alcohol use: Yes    Alcohol/week: 14.0 standard drinks    Types: 14 Standard drinks or equivalent per week    Comment: 1-2 before dinner  . Drug use: No  . Sexual activity: Not on file  Other Topics Concern  . Not on file  Social History Narrative   Patient is married [name]).   Patient has three children and 7 grandchildren.   Patient is retired.   Patient drinks one serving of coffee daily.   Patient is right-handed.   Patient has a college education.   Social Determinants of Health   Financial Resource Strain:   . Difficulty of Paying Living Expenses: Not on file  Food Insecurity:   . Worried About Charity fundraiser in the Last Year: Not on file  . Ran Out of Food in the Last Year: Not on file  Transportation Needs:   . Lack of Transportation (Medical): Not on file  . Lack of Transportation (Non-Medical): Not on file  Physical Activity:   . Days of Exercise per Week: Not on file  . Minutes of Exercise per Session: Not on file  Stress:   . Feeling of Stress : Not on file  Social Connections:   . Frequency of  Communication with Friends and Family: Not on file  . Frequency of Social Gatherings with Friends and Family: Not on file  . Attends Religious Services: Not on file  . Active Member of Clubs or Organizations: Not  on file  . Attends Archivist Meetings: Not on file  . Marital Status: Not on file  Intimate Partner Violence:   . Fear of Current or Ex-Partner: Not on file  . Emotionally Abused: Not on file  . Physically Abused: Not on file  . Sexually Abused: Not on file      PHYSICAL EXAM  Vitals:   05/24/20 1514  BP: 135/69  Pulse: 89  Weight: 220 lb (99.8 kg)  Height: 5\' 10"  (1.778 m)   Body mass index is 31.57 kg/m.   Generalized: Well developed, in no acute distress   Cardiology: normal rate and rhythm Respiratory: clear to auscultation bilaterally   Neurological examination  Mentation: Alert oriented to time, place, history taking. Follows all commands speech and language fluent Cranial nerve II-XII: Pupils were equal round reactive to light. Extraocular movements were full, visual field were full on confrontational test. Facial sensation and strength were normal. Head turning and shoulder shrug  were normal and symmetric. Motor: The motor testing reveals 5 over 5 strength of all 4 extremities. Good symmetric motor tone is noted throughout.  Sensory: Sensory testing is intact to soft touch on all 4 extremities. No evidence of extinction is noted.  Gait and station: Gait is normal. Uses cane for stability.      DIAGNOSTIC DATA (LABS, IMAGING, TESTING) - I reviewed patient records, labs, notes, testing and imaging myself where available.  Lab Results  Component Value Date   WBC 10.5 02/20/2020   HGB 13.1 02/20/2020   HCT 37.8 (L) 02/20/2020   MCV 92.4 02/20/2020   PLT 257 02/20/2020      Component Value Date/Time   NA 131 (L) 02/20/2020 0013   K 4.3 02/20/2020 0013   CL 93 (L) 02/20/2020 0013   CO2 24 02/20/2020 0013   GLUCOSE 159 (H) 02/20/2020  0013   BUN 13 02/20/2020 0013   CREATININE 0.55 (L) 02/20/2020 0013   CREATININE 0.65 03/28/2014 1208   CALCIUM 9.3 02/20/2020 0013   PROT 6.8 04/14/2018 1632   ALBUMIN 3.6 09/07/2014 1350   AST 29 09/07/2014 1350   ALT 65 (H) 09/07/2014 1350   ALKPHOS 74 09/07/2014 1350   BILITOT 0.3 09/07/2014 1350   GFRNONAA >60 02/20/2020 0013   GFRAA >60 02/20/2020 0013   No results found for: CHOL, HDL, LDLCALC, LDLDIRECT, TRIG, CHOLHDL Lab Results  Component Value Date   HGBA1C 8.5 (H) 09/07/2014   Lab Results  Component Value Date   VITAMINB12 1,352 (H) 09/07/2014   Lab Results  Component Value Date   TSH 1.303 09/07/2014      ASSESSMENT AND PLAN  84 y.o. year old male  has a past medical history of Asthma, Bronchitis, COPD (chronic obstructive pulmonary disease) ([place]), Degenerative cervical disc, Diabetes mellitus, Diabetic peripheral neuropathy ([place]) (02/25/2018), Hematoma, Hypercholesterolemia, Hypertension, Iliac artery aneurysm ([place]), Lung nodule, Macular degeneration, Peripheral neuropathy, Progressive gait disorder, Progressive gait disorder (09/15/2013), Skull fracture ([place]), and Subdural hematoma ([place]). here with   Neuropathy associated with endocrine disorder [facility])  Aydrien is doing well. He reports that neuropathy waxes and wanes but, overall, is well managed. He was prescribed gabapentin but has been hesitant to use this. I have educated him on possible dosing options as well as possible side effects. He may also consider Salonpas with capsaicin for symptoms relief. Healthy lifestyle habits encouraged. He will continue close follow up with care team. He will follow up with [place] in 1 year. He and his wife verbalize  understanding and agreement with this plan.     I spent 20 minutes of face-to-face and non-face-to-face time with patient.  This included previsit chart review, lab review, study review, order entry, electronic health record documentation, patient education.    Debbora Presto, MSN, FNP-C 05/24/2020, 3:26 PM  [facility] [address], [place], [place] [address] [phone]

## 2020-05-24 NOTE — Patient Instructions (Addendum)
Below is our plan:  We will continue current treatment plan. I do think that gabapentin could be helpful if you wish to take this medication. Start with 1 capsule daily for 2-3 days then increase to 1 capsule twice daily for 2-3 days then increase dose to 1 capsule three times daily. You may also consider using Salanpas patches with Capsaicin.   Please make sure you are staying well hydrated. I recommend 50-60 ounces daily. Well balanced diet and regular exercise encouraged.    Please continue follow up with care team as directed.   Follow up   You may receive a survey regarding today's visit. I encourage you to leave honest feed back as I do use this information to improve patient care. Thank you for seeing me today!     Capsaicin topical patch What is this medicine? CAPSAICIN (cap SAY sin) is a pain reliever. It is used to treat postherpetic neuralgia (PHN) or diabetic neuropathy pain. This medicine may be used for other purposes; ask your health care provider or pharmacist if you have questions. COMMON BRAND NAME(S): Qutenza What should I tell my health care provider before I take this medicine? They need to know if you have any of these conditions:  broken or irritated skin  high blood pressure  history of heart attack or stroke  an unusual or allergic reaction to capsaicin, hot peppers, other medicines, foods, dyes, or preservatives  pregnant or trying to get pregnant  breast-feeding How should I use this medicine? This medicine is for external use only. It is applied by a health care professional in a hospital or clinic setting. Talk to your pediatrician regarding the use of this medicine in children. Special care may be needed. Overdosage: If you think you have taken too much of this medicine contact a poison control center or emergency room at once. NOTE: This medicine is only for you. Do not share this medicine with others. What if I miss a dose? This does not  apply. What may interact with this medicine? Interactions are not expected. Do not use any other skin products on the affected area without asking your doctor or health care professional. This list may not describe all possible interactions. Give your health care provider a list of all the medicines, herbs, non-prescription drugs, or dietary supplements you use. Also tell them if you smoke, drink alcohol, or use illegal drugs. Some items may interact with your medicine. What should I watch for while using this medicine? Your condition will be monitored carefully while you are receiving this medicine. Your blood pressure may go up during the procedure. Do not touch the drug patch during treatment. This medicine causes red, burning skin. You may need pain medicine for during and after the procedure. This medicine can make you more sensitive to heat for a few days after treatment. Be careful in hot showers or baths. Keep out of the sun. Exercise may make the treated skin feel hotter. Tell your doctor or healthcare professional if your symptoms do not start to get better or if they get worse. What side effects may I notice from receiving this medicine? Side effects that you should report to your doctor or health care professional as soon as possible:  allergic reactions like skin rash, itching or hives, swelling of the face, lips, or tongue  burning pain, redness that does not go away  changes in blood pressure  cough or trouble breathing  eye irritation  skin sores or thinning Side  effects that usually do not require medical attention (report to your doctor or health care professional if they continue or are bothersome):  bruising  dry skin  nausea  unusual body smell This list may not describe all possible side effects. Call your doctor for medical advice about side effects. You may report side effects to FDA at 1-800-FDA-1088. Where should I keep my medicine? This drug is given in a  hospital or clinic and will not be stored at home. NOTE: This sheet is a summary. It may not cover all possible information. If you have questions about this medicine, talk to your doctor, pharmacist, or health care provider.  2020 Elsevier/Gold Standard (2019-02-11 01:06:16)    Peripheral Neuropathy Peripheral neuropathy is a type of nerve damage. It affects nerves that carry signals between the spinal cord and the arms, legs, and the rest of the body (peripheral nerves). It does not affect nerves in the spinal cord or brain. In peripheral neuropathy, one nerve or a group of nerves may be damaged. Peripheral neuropathy is a broad category that includes many specific nerve disorders, like diabetic neuropathy, hereditary neuropathy, and carpal tunnel syndrome. What are the causes? This condition may be caused by:  Diabetes. This is the most common cause of peripheral neuropathy.  Nerve injury.  Pressure or stress on a nerve that lasts a long time.  Lack (deficiency) of B vitamins. This can result from alcoholism, poor diet, or a restricted diet.  Infections.  Autoimmune diseases, such as rheumatoid arthritis and systemic lupus erythematosus.  Nerve diseases that are passed from parent to child (inherited).  Some medicines, such as cancer medicines (chemotherapy).  Poisonous (toxic) substances, such as lead and mercury.  Too little blood flowing to the legs.  Kidney disease.  Thyroid disease. In some cases, the cause of this condition is not known. What are the signs or symptoms? Symptoms of this condition depend on which of your nerves is damaged. Common symptoms include:  Loss of feeling (numbness) in the feet, hands, or both.  Tingling in the feet, hands, or both.  Burning pain.  Very sensitive skin.  Weakness.  Not being able to move a part of the body (paralysis).  Muscle twitching.  Clumsiness or poor coordination.  Loss of balance.  Not being able to  control your bladder.  Feeling dizzy.  Sexual problems. How is this diagnosed? Diagnosing and finding the cause of peripheral neuropathy can be difficult. Your health care provider will take your medical history and do a physical exam. A neurological exam will also be done. This involves checking things that are affected by your brain, spinal cord, and nerves (nervous system). For example, your health care provider will check your reflexes, how you move, and what you can feel. You may have other tests, such as:  Blood tests.  Electromyogram (EMG) and nerve conduction tests. These tests check nerve function and how well the nerves are controlling the muscles.  Imaging tests, such as CT scans or MRI to rule out other causes of your symptoms.  Removing a small piece of nerve to be examined in a lab (nerve biopsy). This is rare.  Removing and examining a small amount of the fluid that surrounds the brain and spinal cord (lumbar puncture). This is rare. How is this treated? Treatment for this condition may involve:  Treating the underlying cause of the neuropathy, such as diabetes, kidney disease, or vitamin deficiencies.  Stopping medicines that can cause neuropathy, such as chemotherapy.  Medicine to relieve pain. Medicines may include: ? Prescription or over-the-counter pain medicine. ? Antiseizure medicine. ? Antidepressants. ? Pain-relieving patches that are applied to painful areas of skin.  Surgery to relieve pressure on a nerve or to destroy a nerve that is causing pain.  Physical therapy to help improve movement and balance.  Devices to help you move around (assistive devices). Follow these instructions at home: Medicines  Take over-the-counter and prescription medicines only as told by your health care provider. Do not take any other medicines without first asking your health care provider.  Do not drive or use heavy machinery while taking prescription pain  medicine. Lifestyle   Do not use any products that contain nicotine or tobacco, such as cigarettes and e-cigarettes. Smoking keeps blood from reaching damaged nerves. If you need help quitting, ask your health care provider.  Avoid or limit alcohol. Too much alcohol can cause a vitamin B deficiency, and vitamin B is needed for healthy nerves.  Eat a healthy diet. This includes: ? Eating foods that are high in fiber, such as fresh fruits and vegetables, whole grains, and beans. ? Limiting foods that are high in fat and processed sugars, such as fried or sweet foods. General instructions   If you have diabetes, work closely with your health care provider to keep your blood sugar under control.  If you have numbness in your feet: ? Check every day for signs of injury or infection. Watch for redness, warmth, and swelling. ? Wear padded socks and comfortable shoes. These help protect your feet.  Develop a good support system. Living with peripheral neuropathy can be stressful. Consider talking with a mental health specialist or joining a support group.  Use assistive devices and attend physical therapy as told by your health care provider. This may include using a walker or a cane.  Keep all follow-up visits as told by your health care provider. This is important. Contact a health care provider if:  You have new signs or symptoms of peripheral neuropathy.  You are struggling emotionally from dealing with peripheral neuropathy.  Your pain is not well-controlled. Get help right away if:  You have an injury or infection that is not healing normally.  You develop new weakness in an arm or leg.  You fall frequently. Summary  Peripheral neuropathy is when the nerves in the arms, or legs are damaged, resulting in numbness, weakness, or pain.  There are many causes of peripheral neuropathy, including diabetes, pinched nerves, vitamin deficiencies, autoimmune disease, and hereditary  conditions.  Diagnosing and finding the cause of peripheral neuropathy can be difficult. Your health care provider will take your medical history, do a physical exam, and do tests, including blood tests and nerve function tests.  Treatment involves treating the underlying cause of the neuropathy and taking medicines to help control pain. Physical therapy and assistive devices may also help. This information is not intended to replace advice given to you by your health care provider. Make sure you discuss any questions you have with your health care provider. Document Revised: 06/13/2017 Document Reviewed: 09/09/2016 Elsevier Patient Education  2020 [employer].

## 2020-05-30 DIAGNOSIS — H8110 Benign paroxysmal vertigo, unspecified ear: Secondary | ICD-10-CM | POA: Diagnosis not present

## 2020-06-28 DIAGNOSIS — G629 Polyneuropathy, unspecified: Secondary | ICD-10-CM | POA: Diagnosis not present

## 2020-06-28 DIAGNOSIS — M19049 Primary osteoarthritis, unspecified hand: Secondary | ICD-10-CM | POA: Diagnosis not present

## 2020-06-28 DIAGNOSIS — M1990 Unspecified osteoarthritis, unspecified site: Secondary | ICD-10-CM | POA: Diagnosis not present

## 2020-06-28 DIAGNOSIS — Z79899 Other long term (current) drug therapy: Secondary | ICD-10-CM | POA: Diagnosis not present

## 2020-06-28 DIAGNOSIS — E119 Type 2 diabetes mellitus without complications: Secondary | ICD-10-CM | POA: Diagnosis not present

## 2020-06-28 DIAGNOSIS — M069 Rheumatoid arthritis, unspecified: Secondary | ICD-10-CM | POA: Diagnosis not present

## 2020-06-28 DIAGNOSIS — J449 Chronic obstructive pulmonary disease, unspecified: Secondary | ICD-10-CM | POA: Diagnosis not present

## 2020-07-20 DIAGNOSIS — D3612 Benign neoplasm of peripheral nerves and autonomic nervous system, upper limb, including shoulder: Secondary | ICD-10-CM | POA: Diagnosis not present

## 2020-07-20 DIAGNOSIS — D225 Melanocytic nevi of trunk: Secondary | ICD-10-CM | POA: Diagnosis not present

## 2020-07-20 DIAGNOSIS — Z85828 Personal history of other malignant neoplasm of skin: Secondary | ICD-10-CM | POA: Diagnosis not present

## 2020-07-20 DIAGNOSIS — L821 Other seborrheic keratosis: Secondary | ICD-10-CM | POA: Diagnosis not present

## 2020-07-21 ENCOUNTER — Other Ambulatory Visit: Payer: Self-pay | Admitting: *Deleted

## 2020-07-21 DIAGNOSIS — I728 Aneurysm of other specified arteries: Secondary | ICD-10-CM

## 2020-08-02 DIAGNOSIS — E559 Vitamin D deficiency, unspecified: Secondary | ICD-10-CM | POA: Diagnosis not present

## 2020-08-02 DIAGNOSIS — I10 Essential (primary) hypertension: Secondary | ICD-10-CM | POA: Diagnosis not present

## 2020-08-02 DIAGNOSIS — E785 Hyperlipidemia, unspecified: Secondary | ICD-10-CM | POA: Diagnosis not present

## 2020-08-02 DIAGNOSIS — Z7984 Long term (current) use of oral hypoglycemic drugs: Secondary | ICD-10-CM | POA: Diagnosis not present

## 2020-08-02 DIAGNOSIS — E1140 Type 2 diabetes mellitus with diabetic neuropathy, unspecified: Secondary | ICD-10-CM | POA: Diagnosis not present

## 2020-08-15 DIAGNOSIS — K219 Gastro-esophageal reflux disease without esophagitis: Secondary | ICD-10-CM | POA: Diagnosis not present

## 2020-08-18 DIAGNOSIS — I10 Essential (primary) hypertension: Secondary | ICD-10-CM | POA: Diagnosis not present

## 2020-08-18 DIAGNOSIS — H353290 Exudative age-related macular degeneration, unspecified eye, stage unspecified: Secondary | ICD-10-CM | POA: Diagnosis not present

## 2020-08-18 DIAGNOSIS — Z0000 Encounter for general adult medical examination without abnormal findings: Secondary | ICD-10-CM | POA: Diagnosis not present

## 2020-08-18 DIAGNOSIS — J449 Chronic obstructive pulmonary disease, unspecified: Secondary | ICD-10-CM | POA: Diagnosis not present

## 2020-08-18 DIAGNOSIS — J45909 Unspecified asthma, uncomplicated: Secondary | ICD-10-CM | POA: Diagnosis not present

## 2020-08-18 DIAGNOSIS — M069 Rheumatoid arthritis, unspecified: Secondary | ICD-10-CM | POA: Diagnosis not present

## 2020-08-18 DIAGNOSIS — E1140 Type 2 diabetes mellitus with diabetic neuropathy, unspecified: Secondary | ICD-10-CM | POA: Diagnosis not present

## 2020-08-18 DIAGNOSIS — E785 Hyperlipidemia, unspecified: Secondary | ICD-10-CM | POA: Diagnosis not present

## 2020-08-18 DIAGNOSIS — D692 Other nonthrombocytopenic purpura: Secondary | ICD-10-CM | POA: Diagnosis not present

## 2020-08-29 ENCOUNTER — Encounter: Payer: Self-pay | Admitting: Vascular Surgery

## 2020-08-29 ENCOUNTER — Ambulatory Visit: Payer: PPO | Admitting: Vascular Surgery

## 2020-08-29 ENCOUNTER — Ambulatory Visit
Admission: RE | Admit: 2020-08-29 | Discharge: 2020-08-29 | Disposition: A | Discharge status: Home / Self Care | Payer: PPO | Source: Ambulatory Visit | Attending: Vascular Surgery | Admitting: Vascular Surgery

## 2020-08-29 ENCOUNTER — Other Ambulatory Visit: Payer: Self-pay

## 2020-08-29 VITALS — BP 133/86 | HR 89 | Temp 96.5°F | Resp 16 | Ht 69.0 in | Wt 217.0 lb

## 2020-08-29 DIAGNOSIS — I723 Aneurysm of iliac artery: Secondary | ICD-10-CM

## 2020-08-29 DIAGNOSIS — K429 Umbilical hernia without obstruction or gangrene: Secondary | ICD-10-CM | POA: Diagnosis not present

## 2020-08-29 DIAGNOSIS — I728 Aneurysm of other specified arteries: Secondary | ICD-10-CM | POA: Diagnosis not present

## 2020-08-29 DIAGNOSIS — K4090 Unilateral inguinal hernia, without obstruction or gangrene, not specified as recurrent: Secondary | ICD-10-CM | POA: Diagnosis not present

## 2020-08-29 DIAGNOSIS — I714 Abdominal aortic aneurysm, without rupture: Secondary | ICD-10-CM | POA: Diagnosis not present

## 2020-08-29 MED ORDER — IOPAMIDOL (ISOVUE-370) INJECTION 76%
75.0000 mL | Freq: Once | INTRAVENOUS | Status: AC | PRN
  Administered 2020-08-29: 75 mL via INTRAVENOUS

## 2020-08-29 NOTE — Progress Notes (Signed)
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  He was last seen in 2019 by our nurse practitioner Vinnie Level Nickel and got a CT scan. 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 He reports no new abdominal or back pain. Really no significant changes to his health since his last visit.  Dr. Kellie Simmering was initially following his aneurysm disease.   Past Medical History:  Diagnosis Date  . Asthma   . Bronchitis   . COPD (chronic obstructive pulmonary disease) ([place])   . Degenerative cervical disc   . Diabetes mellitus   . Diabetic peripheral neuropathy ([place]) 02/25/2018  . Hematoma    left subdoral  . Hypercholesterolemia   . Hypertension   . Iliac artery aneurysm ([place])   . Lung nodule   . Macular degeneration   . Peripheral neuropathy   . Progressive gait disorder   . 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 Skull fracture ([place])   . Subdural hematoma ([facility])     History reviewed. No pertinent surgical history.  Family History  Problem Relation Age of Onset  . Stroke Father     SOCIAL HISTORY: Social History   Socioeconomic History  . Marital status: Married    Spouse name: [name]  . Number of children: 3  . Years of education: 41  . Highest education level: Not on file  Occupational History  . Occupation: Retired  Tobacco Use  . Smoking status: Former Smoker    Packs/day: 0.50    Years: 60.00    Pack years: 30.00    Types: Cigarettes    Quit date: 04/07/2014    Years since quitting: 6.4  . Smokeless tobacco: Never Used  Substance and Sexual Activity  . Alcohol use: Yes    Alcohol/week:  14.0 standard drinks    Types: 14 Standard drinks or equivalent per week    Comment: 1-2 before dinner  . Drug use: No  . Sexual activity: Not on file  Other Topics Concern  . Not on file  Social History Narrative   Patient is married [name]).   Patient has three children and 7 grandchildren.   Patient is retired.   Patient drinks one serving of coffee daily.   Patient is right-handed.   Patient has a college education.   Social Determinants of Health   Financial Resource Strain: Not on file  Food Insecurity: Not on file  Transportation Needs: Not on file  Physical Activity: Not on file  Stress: Not on file  Social Connections: Not on file  Intimate Partner Violence: Not on file    Allergies  Allergen Reactions  . Lisinopril Cough  . Other Other (See Comments)  . Pneumococcal Vac Polyvalent Other (See Comments)    Fever, sweats  . Prednisone Other (See Comments)    Sweating with high dose  . Prevnar 13 [Pneumococcal 13-Val Conj Vacc] Swelling  . Penicillins Rash  . Sulfa Antibiotics Rash    Current Outpatient Medications  Medication Sig Dispense Refill  . albuterol (PROVENTIL HFA;VENTOLIN HFA) 108 (90 BASE) MCG/ACT inhaler Inhale 2 puffs into the lungs every 6 (six) hours  as needed for wheezing or shortness of breath.    . Ascorbic Acid (VITAMIN C PO) Take 1 tablet by mouth daily.    Marland Kitchen aspirin 81 MG tablet Take 81 mg by mouth daily.    . cetirizine (ZYRTEC) 10 MG tablet Take 10 mg by mouth daily.    . famotidine (PEPCID) 20 MG tablet One at bedtime (Patient taking differently: Take 20 mg by mouth at bedtime.)    . finasteride (PROSCAR) 5 MG tablet Take 5 mg by mouth daily.    . folic acid (FOLVITE) 1 MG tablet Take 1 mg by mouth daily.    Marland Kitchen glipiZIDE (GLUCOTROL XL) 10 MG 24 hr tablet Take 10 mg by mouth daily.    Marland Kitchen losartan (COZAAR) 100 MG tablet Take 100 mg by mouth daily.    . metFORMIN (GLUCOPHAGE) 500 MG tablet Take 1 tablet (500 mg total) by mouth 2 (two) times  daily with a meal. Resume tonight.    . methotrexate 2.5 MG tablet Take 2.5 mg by mouth once a week. Take 6 tablets orally once a week    . Multiple Vitamin (MULTIVITAMIN WITH MINERALS) TABS Take 1 tablet by mouth daily.    Marland Kitchen omeprazole (PRILOSEC) 40 MG capsule Take 40 mg by mouth daily.    . ONE TOUCH ULTRA TEST test strip     . ONETOUCH DELICA LANCETS 40H MISC     . SYMBICORT 160-4.5 MCG/ACT inhaler INHALE 2 PUFFS INTO THE LUNGS 2 TIMES DAILY (Patient taking differently: Inhale 2 puffs into the lungs 2 (two) times daily.) 1 Inhaler 11  . Tamsulosin HCl (FLOMAX) 0.4 MG CAPS Take 0.4 mg by mouth daily.    Marland Kitchen gabapentin (NEURONTIN) 100 MG capsule Take 100 mg by mouth 3 (three) times daily. (Patient not taking: Reported on 08/29/2020)     No current facility-administered medications for this visit.    REVIEW OF SYSTEMS:  [X]  denotes positive finding, [ ]  denotes negative finding Cardiac  Comments:  Chest pain or chest pressure:    Shortness of breath upon exertion:    Short of breath when lying flat:    Irregular heart rhythm:        Vascular    Pain in calf, thigh, or hip brought on by ambulation:    Pain in feet at night that wakes you up from your sleep:     Blood clot in your veins:    Leg swelling:         Pulmonary    Oxygen at home:    Productive cough:     Wheezing:         Neurologic    Sudden weakness in arms or legs:     Sudden numbness in arms or legs:     Sudden onset of difficulty speaking or slurred speech:    Temporary loss of vision in one eye:     Problems with dizziness:         Gastrointestinal    Blood in stool:     Vomited blood:         Genitourinary    Burning when urinating:     Blood in urine:        Psychiatric    Major depression:         Hematologic    Bleeding problems:    Problems with blood clotting too easily:        Skin    Rashes or ulcers:  Constitutional    Fever or chills:      PHYSICAL EXAM: Vitals:   08/29/20  1420  BP: 133/86  Pulse: 89  Resp: 16  Temp: (!) 96.5 F (35.8 C)  TempSrc: Temporal  SpO2: 94%  Weight: 217 lb (98.4 kg)  Height: 5\' 9"  (1.753 m)    GENERAL: The patient is a well-nourished male, in no acute distress. The vital signs are documented above. CARDIAC: There is a regular rate and rhythm.  VASCULAR:  Palpable femoral pulses bilateral PULMONARY: No respiratory distress. ABDOMEN: Soft and non-tender. MUSCULOSKELETAL: There are no major deformities or cyanosis. NEUROLOGIC: No focal weakness or paresthesias are detected. SKIN: There are no ulcers or rashes noted. PSYCHIATRIC: The patient has a normal affect.  DATA:   CTA abdomen pelvis from today 08/29/20 reviewed and I do not see significant interval change with stable celiac aneurysm measuring about 14 mm and a right and left iliac aneurysms also stable measuring 2.7 and 2.6 cm, respectively.  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  Assessment/Plan:  85 year old male presents for 2-year interval follow-up for surveillance of a known celiac artery aneurysm and bilateral common iliac artery aneurysms. I reviewed his CTA with him in the office and compared to the CT from 2019 and I see no significant interval change.. Celiac aneurysm remains about 14 mm and iliac aneurysms stable at 2.7 and 2.6 cm by my measurement.  Discussed no indication for surgical intervention and will arrange follow-up again in 2 years with CTA.   Marty Heck, MD [facility] Office: [phone]

## 2020-09-27 ENCOUNTER — Telehealth: Payer: Self-pay | Admitting: Family Medicine

## 2020-09-27 DIAGNOSIS — M1990 Unspecified osteoarthritis, unspecified site: Secondary | ICD-10-CM | POA: Diagnosis not present

## 2020-09-27 DIAGNOSIS — G629 Polyneuropathy, unspecified: Secondary | ICD-10-CM | POA: Diagnosis not present

## 2020-09-27 DIAGNOSIS — Z79899 Other long term (current) drug therapy: Secondary | ICD-10-CM | POA: Diagnosis not present

## 2020-09-27 DIAGNOSIS — M19049 Primary osteoarthritis, unspecified hand: Secondary | ICD-10-CM | POA: Diagnosis not present

## 2020-09-27 DIAGNOSIS — J449 Chronic obstructive pulmonary disease, unspecified: Secondary | ICD-10-CM | POA: Diagnosis not present

## 2020-09-27 DIAGNOSIS — M069 Rheumatoid arthritis, unspecified: Secondary | ICD-10-CM | POA: Diagnosis not present

## 2020-09-27 DIAGNOSIS — E119 Type 2 diabetes mellitus without complications: Secondary | ICD-10-CM | POA: Diagnosis not present

## 2020-09-27 NOTE — Telephone Encounter (Signed)
Pt has called and made an appointment for the Neuropathy pain, he is on wait list, pt would like a call from RN on anything that could be suggested while he waits to be seen

## 2020-09-27 NOTE — Telephone Encounter (Signed)
Spoke to pt and relayed that he can take the gabapentin that he has, he never took from when he received previously.  He states no pain, but numbness/ burning.  Level 3-4, pain 1-2.  No swelling.  Standing/ walking he feels it more.  Appreciated call back.  Salonpas/capsaicin patched he can use otc if desires.

## 2020-10-18 DIAGNOSIS — H8110 Benign paroxysmal vertigo, unspecified ear: Secondary | ICD-10-CM | POA: Diagnosis not present

## 2020-10-30 DIAGNOSIS — Z961 Presence of intraocular lens: Secondary | ICD-10-CM | POA: Diagnosis not present

## 2020-10-30 DIAGNOSIS — E113293 Type 2 diabetes mellitus with mild nonproliferative diabetic retinopathy without macular edema, bilateral: Secondary | ICD-10-CM | POA: Diagnosis not present

## 2020-10-30 DIAGNOSIS — H353132 Nonexudative age-related macular degeneration, bilateral, intermediate dry stage: Secondary | ICD-10-CM | POA: Diagnosis not present

## 2020-10-30 DIAGNOSIS — H5203 Hypermetropia, bilateral: Secondary | ICD-10-CM | POA: Diagnosis not present

## 2020-11-16 DIAGNOSIS — J449 Chronic obstructive pulmonary disease, unspecified: Secondary | ICD-10-CM | POA: Diagnosis not present

## 2020-11-16 DIAGNOSIS — E1140 Type 2 diabetes mellitus with diabetic neuropathy, unspecified: Secondary | ICD-10-CM | POA: Diagnosis not present

## 2020-11-16 DIAGNOSIS — Z7984 Long term (current) use of oral hypoglycemic drugs: Secondary | ICD-10-CM | POA: Diagnosis not present

## 2020-11-16 DIAGNOSIS — I10 Essential (primary) hypertension: Secondary | ICD-10-CM | POA: Diagnosis not present

## 2020-11-16 DIAGNOSIS — E785 Hyperlipidemia, unspecified: Secondary | ICD-10-CM | POA: Diagnosis not present

## 2020-12-12 DIAGNOSIS — H5713 Ocular pain, bilateral: Secondary | ICD-10-CM | POA: Diagnosis not present

## 2021-01-01 ENCOUNTER — Ambulatory Visit: Payer: PPO | Admitting: Family Medicine

## 2021-01-01 DIAGNOSIS — M19049 Primary osteoarthritis, unspecified hand: Secondary | ICD-10-CM | POA: Diagnosis not present

## 2021-01-01 DIAGNOSIS — J449 Chronic obstructive pulmonary disease, unspecified: Secondary | ICD-10-CM | POA: Diagnosis not present

## 2021-01-01 DIAGNOSIS — M1990 Unspecified osteoarthritis, unspecified site: Secondary | ICD-10-CM | POA: Diagnosis not present

## 2021-01-01 DIAGNOSIS — E119 Type 2 diabetes mellitus without complications: Secondary | ICD-10-CM | POA: Diagnosis not present

## 2021-01-01 DIAGNOSIS — M069 Rheumatoid arthritis, unspecified: Secondary | ICD-10-CM | POA: Diagnosis not present

## 2021-01-01 DIAGNOSIS — G629 Polyneuropathy, unspecified: Secondary | ICD-10-CM | POA: Diagnosis not present

## 2021-01-01 DIAGNOSIS — Z79899 Other long term (current) drug therapy: Secondary | ICD-10-CM | POA: Diagnosis not present

## 2021-01-03 DIAGNOSIS — R350 Frequency of micturition: Secondary | ICD-10-CM | POA: Diagnosis not present

## 2021-01-03 DIAGNOSIS — N401 Enlarged prostate with lower urinary tract symptoms: Secondary | ICD-10-CM | POA: Diagnosis not present

## 2021-01-10 ENCOUNTER — Ambulatory Visit: Payer: Self-pay | Admitting: Family Medicine

## 2021-01-10 NOTE — Progress Notes (Deleted)
No chief complaint on file.    HISTORY OF PRESENT ILLNESS: 01/10/2021 ALL:  [name] returns for follow up for neuropathy. We had discussed trial of gabapentin versus Salonpas for management of mild pain, numbness and tingling at last visit 05/2020.   05/24/2020 ALL:  [name] is a 85 y.o. male here today for follow up for axonal peripheral neuropathy. He was prescribed gabapentin 100mg  TID by PCP. He took 1 dose and did not feel much benefit so he did not continue medication. He reports that neuropathy is fairly stable. He does have waxing and waning symptoms. He denies pain but described an uncomfortable numbness sensation. He does feel a little off balance from time to time. No falls. He uses a cane. He does not feel PT would be beneficial at this time. He continues to follow up with his care team regularly. A1C was 7.1. he is working to control BP. He is exercising daily on his stationary bike. He tries to stay active.    HISTORY (copied from my note on 04/20/2019)  [name] is a 85 y.o. male here today for follow up for neuropathy.  He feels that he is doing quite well.   He is present today with his wife who agrees.  He does continue to have some heaviness in his feet.  He is able to exercise daily.  He walks and rides a stationary bike.  He continues close follow-up with rheumatology on methotrexate and prednisone.  He is not interested in taking medications to help with neuropathy at this time.  He feels that overall he is doing well and without concerns today.    REVIEW OF SYSTEMS: Out of a complete 14 system review of symptoms, the patient complains only of the following symptoms, neuropathy and all other reviewed systems are negative.   ALLERGIES: Allergies  Allergen Reactions   Lisinopril Cough   Other Other (See Comments)   Pneumococcal Vac Polyvalent Other (See Comments)    Fever, sweats   Prednisone Other (See Comments)    Sweating with high dose   Prevnar 13  [Pneumococcal 13-Val Conj Vacc] Swelling   Penicillins Rash   Sulfa Antibiotics Rash     HOME MEDICATIONS: Outpatient Medications Prior to Visit  Medication Sig Dispense Refill   albuterol (PROVENTIL HFA;VENTOLIN HFA) 108 (90 BASE) MCG/ACT inhaler Inhale 2 puffs into the lungs every 6 (six) hours as needed for wheezing or shortness of breath.     Ascorbic Acid (VITAMIN C PO) Take 1 tablet by mouth daily.     aspirin 81 MG tablet Take 81 mg by mouth daily.     cetirizine (ZYRTEC) 10 MG tablet Take 10 mg by mouth daily.     famotidine (PEPCID) 20 MG tablet One at bedtime (Patient taking differently: Take 20 mg by mouth at bedtime.)     finasteride (PROSCAR) 5 MG tablet Take 5 mg by mouth daily.     folic acid (FOLVITE) 1 MG tablet Take 1 mg by mouth daily.     gabapentin (NEURONTIN) 100 MG capsule Take 100 mg by mouth 3 (three) times daily. (Patient not taking: Reported on 08/29/2020)     glipiZIDE (GLUCOTROL XL) 10 MG 24 hr tablet Take 10 mg by mouth daily.     losartan (COZAAR) 100 MG tablet Take 100 mg by mouth daily.     metFORMIN (GLUCOPHAGE) 500 MG tablet Take 1 tablet (500 mg total) by mouth 2 (two) times daily with a meal. Resume tonight.  methotrexate 2.5 MG tablet Take 2.5 mg by mouth once a week. Take 6 tablets orally once a week     Multiple Vitamin (MULTIVITAMIN WITH MINERALS) TABS Take 1 tablet by mouth daily.     omeprazole (PRILOSEC) 40 MG capsule Take 40 mg by mouth daily.     ONE TOUCH ULTRA TEST test strip      ONETOUCH DELICA LANCETS 16X MISC      SYMBICORT 160-4.5 MCG/ACT inhaler INHALE 2 PUFFS INTO THE LUNGS 2 TIMES DAILY (Patient taking differently: Inhale 2 puffs into the lungs 2 (two) times daily.) 1 Inhaler 11   Tamsulosin HCl (FLOMAX) 0.4 MG CAPS Take 0.4 mg by mouth daily.     No facility-administered medications prior to visit.     PAST MEDICAL HISTORY: Past Medical History:  Diagnosis Date   Asthma    Bronchitis    COPD (chronic obstructive pulmonary  disease) ([place])    Degenerative cervical disc    Diabetes mellitus    Diabetic peripheral neuropathy ([place]) 02/25/2018   Hematoma    left subdoral   Hypercholesterolemia    Hypertension    Iliac artery aneurysm ([facility])    Lung nodule    Macular degeneration    Peripheral neuropathy    Progressive gait disorder    Progressive gait disorder 09/15/2013   Skull fracture ([facility])    Subdural hematoma ([facility])      PAST SURGICAL HISTORY: No past surgical history on file.   FAMILY HISTORY: Family History  Problem Relation Age of Onset   Stroke Father      SOCIAL HISTORY: Social History   Socioeconomic History   Marital status: Married    Spouse name: [name]   Number of children: 3   Years of education: 13   Highest education level: Not on file  Occupational History   Occupation: Retired  Tobacco Use   Smoking status: Former    Packs/day: 0.50    Years: 60.00    Pack years: 30.00    Types: Cigarettes    Quit date: 04/07/2014    Years since quitting: 6.7   Smokeless tobacco: Never  Substance and Sexual Activity   Alcohol use: Yes    Alcohol/week: 14.0 standard drinks    Types: 14 Standard drinks or equivalent per week    Comment: 1-2 before dinner   Drug use: No   Sexual activity: Not on file  Other Topics Concern   Not on file  Social History Narrative   Patient is married [name]).   Patient has three children and 7 grandchildren.   Patient is retired.   Patient drinks one serving of coffee daily.   Patient is right-handed.   Patient has a college education.   Social Determinants of Health   Financial Resource Strain: Not on file  Food Insecurity: Not on file  Transportation Needs: Not on file  Physical Activity: Not on file  Stress: Not on file  Social Connections: Not on file  Intimate Partner Violence: Not on file      PHYSICAL EXAM  There were no vitals filed for this visit.  There is no height or weight on file to calculate BMI.   Generalized: Well  developed, in no acute distress   Cardiology: normal rate and rhythm Respiratory: clear to auscultation bilaterally   Neurological examination  Mentation: Alert oriented to time, place, history taking. Follows all commands speech and language fluent Cranial nerve II-XII: Pupils were equal round reactive to light. Extraocular movements were  full, visual field were full on confrontational test. Facial sensation and strength were normal. Head turning and shoulder shrug  were normal and symmetric. Motor: The motor testing reveals 5 over 5 strength of all 4 extremities. Good symmetric motor tone is noted throughout.  Sensory: Sensory testing is intact to soft touch on all 4 extremities. No evidence of extinction is noted.  Gait and station: Gait is normal. Uses cane for stability.      DIAGNOSTIC DATA (LABS, IMAGING, TESTING) - I reviewed patient records, labs, notes, testing and imaging myself where available.  Lab Results  Component Value Date   WBC 10.5 02/20/2020   HGB 13.1 02/20/2020   HCT 37.8 (L) 02/20/2020   MCV 92.4 02/20/2020   PLT 257 02/20/2020      Component Value Date/Time   NA 131 (L) 02/20/2020 0013   K 4.3 02/20/2020 0013   CL 93 (L) 02/20/2020 0013   CO2 24 02/20/2020 0013   GLUCOSE 159 (H) 02/20/2020 0013   BUN 13 02/20/2020 0013   CREATININE 0.55 (L) 02/20/2020 0013   CREATININE 0.65 03/28/2014 1208   CALCIUM 9.3 02/20/2020 0013   PROT 6.8 04/14/2018 1632   ALBUMIN 3.6 09/07/2014 1350   AST 29 09/07/2014 1350   ALT 65 (H) 09/07/2014 1350   ALKPHOS 74 09/07/2014 1350   BILITOT 0.3 09/07/2014 1350   GFRNONAA >60 02/20/2020 0013   GFRAA >60 02/20/2020 0013   No results found for: CHOL, HDL, LDLCALC, LDLDIRECT, TRIG, CHOLHDL Lab Results  Component Value Date   HGBA1C 8.5 (H) 09/07/2014   Lab Results  Component Value Date   VITAMINB12 1,352 (H) 09/07/2014   Lab Results  Component Value Date   TSH 1.303 09/07/2014      ASSESSMENT AND PLAN  85  y.o. year old male  has a past medical history of Asthma, Bronchitis, COPD (chronic obstructive pulmonary disease) ([place]), Degenerative cervical disc, Diabetes mellitus, Diabetic peripheral neuropathy ([place]) (02/25/2018), Hematoma, Hypercholesterolemia, Hypertension, Iliac artery aneurysm ([place]), Lung nodule, Macular degeneration, Peripheral neuropathy, Progressive gait disorder, Progressive gait disorder (09/15/2013), Skull fracture ([place]), and Subdural hematoma ([place]). here with   No diagnosis found.  [name] is doing well. He reports that neuropathy waxes and wanes but, overall, is well managed. He was prescribed gabapentin but has been hesitant to use this. I have educated him on possible dosing options as well as possible side effects. He may also consider Salonpas with capsaicin for symptoms relief. Healthy lifestyle habits encouraged. He will continue close follow up with care team. He will follow up with [place] in 1 year. He and his wife verbalize understanding and agreement with this plan.     Debbora Presto, MSN, FNP-C 01/10/2021, 10:16 AM  [facility] [address], [place], [place] [address] [phone]

## 2021-02-27 ENCOUNTER — Telehealth: Payer: Self-pay | Admitting: Neurology

## 2021-02-27 ENCOUNTER — Ambulatory Visit: Payer: PPO | Admitting: Neurology

## 2021-02-27 NOTE — Telephone Encounter (Signed)
Pt called had to cancel his appt, not feeling well. Will call back to reschedule his appt.

## 2021-03-12 ENCOUNTER — Ambulatory Visit: Payer: PPO | Admitting: Neurology

## 2021-03-12 ENCOUNTER — Encounter: Payer: Self-pay | Admitting: Neurology

## 2021-03-12 ENCOUNTER — Other Ambulatory Visit: Payer: Self-pay

## 2021-03-12 VITALS — BP 132/80 | HR 103 | Wt 218.0 lb

## 2021-03-12 DIAGNOSIS — Z79899 Other long term (current) drug therapy: Secondary | ICD-10-CM | POA: Diagnosis not present

## 2021-03-12 DIAGNOSIS — G629 Polyneuropathy, unspecified: Secondary | ICD-10-CM | POA: Insufficient documentation

## 2021-03-12 DIAGNOSIS — M0579 Rheumatoid arthritis with rheumatoid factor of multiple sites without organ or systems involvement: Secondary | ICD-10-CM | POA: Insufficient documentation

## 2021-03-12 DIAGNOSIS — E889 Metabolic disorder, unspecified: Secondary | ICD-10-CM

## 2021-03-12 DIAGNOSIS — E1149 Type 2 diabetes mellitus with other diabetic neurological complication: Secondary | ICD-10-CM | POA: Diagnosis not present

## 2021-03-12 DIAGNOSIS — G63 Polyneuropathy in diseases classified elsewhere: Secondary | ICD-10-CM | POA: Diagnosis not present

## 2021-03-12 DIAGNOSIS — Z79631 Long term (current) use of antimetabolite agent: Secondary | ICD-10-CM

## 2021-03-12 NOTE — Progress Notes (Signed)
[facility]   Provider:  Larey Seat, M D  Primary Care Physician:  Maurice Small, MD   Referring Provider: Maurice Small, MD     03-12-2021, RV for  [name] is a 85 y.o. male patient with neuropathy and gait disorder.  Uses a cane for the last 10 years. No pain- numbness. He likes to keep moving, uses an stationary bike and looks always for a seat , any opportunity to sit down when walking. Standing for long time feels unsafe. He neuropathy makes in unsafe to walk barefoot.       Virtual Visit via Telephone Note I connected with [name] on 10-14-2018 at  3:30 PM EDT by telephone and verified that I am speaking with the correct person using two identifiers. I discussed the limitations, risks, security and privacy concerns of performing an evaluation and management service by telephone and the availability of in person appointments. I also discussed with the patient that there may be a patient responsible charge related to this service. The patient expressed understanding and agreed to proceed.   HPI:  [name] is a 85 y.o. male patient I spoke to by telephone encounter on  10-14-2018, patient doing better on steroid taper and methotrexate - Patient feels he finally received the right diagnosis  through rheumatology- and is .doing well.  He is still bothered by his neuropathy , the feeling of waking on pebbles, tingling, and yet numb. His feet feel heavy.  Dr Jannifer Franklin did examine him by NCV and EMG. Axonal neuropathy was diagnosed.  He briefly spoke about the remote results of his MRI of the back.  He states that visit diagnosis he has regained a good appetite on methotrexate and folic acid he still has a diabetes mellitus and hypertension which are medication controlled but it seems that diabetes was not the only cause for neuropathy.  He reports that he is doing okay but he feels numb his legs feel heavy especially his feet and often when he wakes up in the  morning he is somewhat stiff.  He does not have pain while in bed or during the night.  He reports difficulties when he walks in a longer big box store the heaviness may indicate weakness.  He feels better when he can lean on a shopping cart.  He denies any recent falls.  His most recent HbA1c was 6.2.  He is followed by Dr. Cindy Hazy rheumatology at [facility].  She also prepared him that his hands may be affected by the diagnosis.    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  He is ANA positive and rheumatoid factor positive he had stopped statins in order to improve his leg strength but now walks better on methotrexate.  His arthropathy has much improved.  I would like the patient to continue his rheumatological regimen I will be happy to see him as needed the patient was agreeable to a visit in 6 months or earlier if needed.  I am especially happy that he did not have any falls.    Notes recorded by Larey Seat, MD on 02/25/2018 at 6:16 PM EDT Most likely origin for this neuropathy is an endocrine disorder, vitamin deficiency or abnormal plasmaprotein. -----Notes recorded by Javoni Lucken, Asencion Partridge,  MD on 02/25/2018 at 6:10 PM EDT [name] is a 85 year old gentleman with a 7-year history of some numbness in the feet and some mild gait instability.  He reports no back pain or pain down the legs. He is treated on steroids and has developed higher blood glucose levels.   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 EMG evaluation of the right lower extremity shows mild chronic stable distal signs of denervation consistent with  a diagnosis of peripheral neuropathy. No evidence of an overlying lumbosacral radiculopathy was seen.  [name] MD 02/25/2018 1:47 PM Chief complaint according to patient : " Heavy feet, and a bit of tension headaches" .  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  He had stopped statins in order to improve his leg strength, but now walks better on methotraxate.  Social history:  Married, originally from [place], non smoker now, non ETOH,     [employer] officer, MD  Review of Systems: Out of a complete 14 system review, the patient complains of only the following symptoms, and all other reviewed systems are negative.  Low back pain, wider based gait, increased  fall risk-but no falls in 6 month. . Feet are numb.     Social History   Socioeconomic History   Marital status: Married    Spouse name: [name]   Number of children: 3   Years of education: 13   Highest education level: Not on file  Occupational History   Occupation: Retired  Tobacco Use   Smoking status: Former    Packs/day: 0.50    Years: 60.00    Pack years: 30.00    Types: Cigarettes    Quit date: 04/07/2014    Years since quitting: 6.9   Smokeless tobacco: Never  Substance and Sexual Activity   Alcohol use: Yes    Alcohol/week: 14.0 standard drinks    Types: 14 Standard drinks or equivalent per week    Comment: 1-2 before dinner   Drug use: No   Sexual activity: Not on file  Other Topics Concern   Not on file  Social History Narrative   Patient is married [name]).   Patient has three children and 7 grandchildren.   Patient is retired.   Patient drinks one serving of coffee daily.   Patient is right-handed.   Patient has a college education.    Social Determinants of Health   Financial Resource Strain: Not on file  Food Insecurity: Not on file  Transportation Needs: Not on file  Physical Activity: Not on file  Stress: Not on file  Social Connections: Not on file  Intimate Partner Violence: Not on file    Family History  Problem Relation Age of Onset   Stroke Father     Past Medical History:  Diagnosis Date   Asthma    Bronchitis    COPD (chronic obstructive pulmonary disease) ([place])    Degenerative cervical disc    Diabetes mellitus    Diabetic peripheral neuropathy ([place]) 02/25/2018   Hematoma    left subdoral   Hypercholesterolemia    Hypertension    Iliac artery aneurysm ([facility])    Lung nodule  Macular degeneration    Peripheral neuropathy    Progressive gait disorder    Progressive gait disorder 09/15/2013   Skull fracture ([facility])    Subdural hematoma ([facility])     No past surgical history on file.  Current Outpatient Medications  Medication Sig Dispense Refill   albuterol (PROVENTIL HFA;VENTOLIN HFA) 108 (90 BASE) MCG/ACT inhaler Inhale 2 puffs into the lungs every 6 (six) hours as needed for wheezing or shortness of breath.     Ascorbic Acid (VITAMIN C PO) Take 1 tablet by mouth daily.     aspirin 81 MG tablet Take 81 mg by mouth daily.     cetirizine (ZYRTEC) 10 MG tablet Take 10 mg by mouth daily.     famotidine (PEPCID) 20 MG tablet One at bedtime (Patient taking differently: Take 20 mg by mouth at bedtime.)     finasteride (PROSCAR) 5 MG tablet Take 5 mg by mouth daily.     folic acid (FOLVITE) 1 MG tablet Take 1 mg by mouth daily.     gabapentin (NEURONTIN) 100 MG capsule Take 100 mg by mouth 3 (three) times daily.     glipiZIDE (GLUCOTROL XL) 10 MG 24 hr tablet Take 10 mg by mouth daily.     losartan (COZAAR) 100 MG tablet Take 100 mg by mouth daily.     metFORMIN (GLUCOPHAGE) 500 MG tablet Take 1 tablet (500 mg total) by mouth 2 (two) times daily with a meal. Resume tonight.     methotrexate 2.5 MG  tablet Take 2.5 mg by mouth once a week. Take 6 tablets orally once a week     Multiple Vitamin (MULTIVITAMIN WITH MINERALS) TABS Take 1 tablet by mouth daily.     omeprazole (PRILOSEC) 40 MG capsule Take 40 mg by mouth daily.     ONE TOUCH ULTRA TEST test strip      ONETOUCH DELICA LANCETS [id number] MISC      SYMBICORT 160-4.5 MCG/ACT inhaler INHALE 2 PUFFS INTO THE LUNGS 2 TIMES DAILY (Patient taking differently: Inhale 2 puffs into the lungs 2 (two) times daily.) 1 Inhaler 11   Tamsulosin HCl (FLOMAX) 0.4 MG CAPS Take 0.4 mg by mouth daily.     No current facility-administered medications for this visit.    Allergies as of 03/12/2021 - Review Complete 03/12/2021  Allergen Reaction Noted   Lisinopril Cough 12/31/2017   Other Other (See Comments) 08/18/2020   Pneumococcal vac polyvalent Other (See Comments) 09/10/2013   Prednisone Other (See Comments) 08/03/2011   Prevnar 13 [pneumococcal 13-val conj vacc] Swelling 09/13/2014   Penicillins Rash 08/03/2011   Sulfa antibiotics Rash 08/03/2011    Vitals: BP 132/80   Pulse (!) 103   Wt 218 lb (98.9 kg)   BMI 32.19 kg/m  Last Weight:  Wt Readings from Last 1 Encounters:  03/12/21 218 lb (98.9 kg)   [id number] mass index is 32.19 kg/m.     Last Height:   Ht Readings from Last 1 Encounters:  08/29/20 '5\' 9"'$  (1.753 m)      Attention span & concentration ability appears normal.  Speech is fluent,  without dysarthria, dysphonia or aphasia.   Assessment:  NP Plan:  Treatment plan and additional workup :  Peripheral axonal Neuropathy-   Prevent progression by taking Vit B 12 , Vit D, and keep moving-  Exercise on a stationary bike is fine. Use the cane when walking.  Try methylated B 12 (Metanexx) Protein serum phoresis was negative .   Rheumatoid  arthritis is a likely explanation .  I will today repeat some blood test.   Discussed the assessment and treatment plan with the patient. The patient was provided an opportunity to ask  questions and all were answered. The patient agreed with the plan and demonstrated an understanding of the instructions.   The patient was advised to call back or seek an in-person evaluation if the symptoms worsen or if the condition fails to improve as anticipated. Rv in 6 month with MD alternating with NP.;looking at the results of the Neuropathy panel.  Encouraging exercise.     I provided 25 minutes of non-face-to-face time during this encounter.   Larey Seat, MD -[id number], [id number] PM  Certified in Neurology by [employer] Certified in [place] by [facility] [address], [place], [place] [address]

## 2021-03-14 NOTE — Progress Notes (Signed)
Normal blood cell count,low creatinine ( that's normal ) slightly lower sodium, chloride- all not relevant. Good liver function and normal thyroid,  normal sed rate - but elevated c reactive protein , also an inflammatory marker-   So far all in normal order

## 2021-03-15 ENCOUNTER — Telehealth: Payer: Self-pay | Admitting: *Deleted

## 2021-03-15 NOTE — Telephone Encounter (Signed)
Called and spoke with pt about labs results per Dr. Edwena Felty note. Pt verbalized understanding. Requested copy be sent to PCP. I sent copy via epic. He would also like copy of lab results. Advised I will send to our medical records department so they can provide him a copy.

## 2021-03-15 NOTE — Telephone Encounter (Signed)
-----   Message from Larey Seat, MD sent at 03/14/2021  5:41 PM EDT ----- Normal blood cell count,low creatinine ( that's normal ) slightly lower sodium, chloride- all not relevant. Good liver function and normal thyroid,  normal sed rate - but elevated c reactive protein , also an inflammatory marker-   So far all in normal order

## 2021-03-18 LAB — COMPREHENSIVE METABOLIC PANEL
ALT: 16 IU/L (ref 0–44)
AST: 15 IU/L (ref 0–40)
Albumin/Globulin Ratio: 2.1 (ref 1.2–2.2)
Albumin: 4.6 g/dL (ref 3.6–4.6)
Alkaline Phosphatase: 72 IU/L (ref 44–121)
BUN/Creatinine Ratio: 25 — ABNORMAL HIGH (ref 10–24)
BUN: 14 mg/dL (ref 8–27)
Bilirubin Total: 0.4 mg/dL (ref 0.0–1.2)
CO2: 24 mmol/L (ref 20–29)
Calcium: 9.2 mg/dL (ref 8.6–10.2)
Chloride: 94 mmol/L — ABNORMAL LOW (ref 96–106)
Creatinine, Ser: 0.56 mg/dL — ABNORMAL LOW (ref 0.76–1.27)
Globulin, Total: 2.2 g/dL (ref 1.5–4.5)
Glucose: 176 mg/dL — ABNORMAL HIGH (ref 65–99)
Potassium: 4.5 mmol/L (ref 3.5–5.2)
Sodium: 133 mmol/L — ABNORMAL LOW (ref 134–144)
Total Protein: 6.8 g/dL (ref 6.0–8.5)
eGFR: 98 mL/min/{1.73_m2} (ref >59)

## 2021-03-18 LAB — CBC WITH DIFFERENTIAL/PLATELET
Basophils Absolute: 0.1 10*3/uL (ref 0.0–0.2)
Basos: 1 %
EOS (ABSOLUTE): 0.1 10*3/uL (ref 0.0–0.4)
Eos: 1 %
Hematocrit: 39.7 % (ref 37.5–51.0)
Hemoglobin: 13.3 g/dL (ref 13.0–17.7)
Immature Grans (Abs): 0 10*3/uL (ref 0.0–0.1)
Immature Granulocytes: 0 %
Lymphocytes Absolute: 1.2 10*3/uL (ref 0.7–3.1)
Lymphs: 15 %
MCH: 31.4 pg (ref 26.6–33.0)
MCHC: 33.5 g/dL (ref 31.5–35.7)
MCV: 94 fL (ref 79–97)
Monocytes Absolute: 0.7 10*3/uL (ref 0.1–0.9)
Monocytes: 9 %
Neutrophils Absolute: 5.9 10*3/uL (ref 1.4–7.0)
Neutrophils: 74 %
Platelets: 263 10*3/uL (ref 150–450)
RBC: 4.23 x10E6/uL (ref 4.14–5.80)
RDW: 14.8 % (ref 11.6–15.4)
WBC: 7.9 10*3/uL (ref 3.4–10.8)

## 2021-03-18 LAB — MULTIPLE MYELOMA PANEL, SERUM
Albumin SerPl Elph-Mcnc: 3.8 g/dL (ref 2.9–4.4)
Albumin/Glob SerPl: 1.3 (ref 0.7–1.7)
Alpha 1: 0.2 g/dL (ref 0.0–0.4)
Alpha2 Glob SerPl Elph-Mcnc: 0.7 g/dL (ref 0.4–1.0)
B-Globulin SerPl Elph-Mcnc: 1.1 g/dL (ref 0.7–1.3)
Gamma Glob SerPl Elph-Mcnc: 1 g/dL (ref 0.4–1.8)
Globulin, Total: 3 g/dL (ref 2.2–3.9)
IgA/Immunoglobulin A, Serum: 340 mg/dL (ref 61–437)
IgG (Immunoglobin G), Serum: 1045 mg/dL (ref 603–1613)
IgM (Immunoglobulin M), Srm: 43 mg/dL (ref 15–143)

## 2021-03-18 LAB — HEPATITIS B SURFACE ANTIBODY,QUALITATIVE: Hep B Surface Ab, Qual: NONREACTIVE

## 2021-03-18 LAB — VITAMIN B1: Thiamine: 157.9 nmol/L (ref 66.5–200.0)

## 2021-03-18 LAB — SJOGREN'S SYNDROME ANTIBODS(SSA + SSB)
ENA SSA (RO) Ab: <0.2 AI (ref 0.0–0.9)
ENA SSB (LA) Ab: <0.2 AI (ref 0.0–0.9)

## 2021-03-18 LAB — SEDIMENTATION RATE: Sed Rate: 24 mm/hr (ref 0–30)

## 2021-03-18 LAB — C-REACTIVE PROTEIN: CRP: 18 mg/L — ABNORMAL HIGH (ref 0–10)

## 2021-03-18 LAB — TSH: TSH: 1.75 u[IU]/mL (ref 0.450–4.500)

## 2021-03-18 LAB — METHYLMALONIC ACID, SERUM: Methylmalonic Acid: 130 nmol/L (ref 0–378)

## 2021-03-20 ENCOUNTER — Telehealth: Payer: Self-pay | Admitting: *Deleted

## 2021-03-20 NOTE — Telephone Encounter (Signed)
Called and spoke w/ pt about lab results per Dr. Dohmeier's note. Pt verbalized understanding.  

## 2021-03-20 NOTE — Telephone Encounter (Signed)
-----   Message from Larey Seat, MD sent at 03/19/2021  1:30 PM EDT ----- Normal protein e phoresis- please send a printed copy to the patient and share with PCP.

## 2021-03-21 ENCOUNTER — Telehealth: Payer: Self-pay | Admitting: *Deleted

## 2021-03-21 LAB — ANA W/REFLEX: ANA Titer 1: NEGATIVE

## 2021-03-21 NOTE — Progress Notes (Signed)
Last pending test : negative for ANA ( anti nuclear antibody), which is associated with lupus, RF vasculitis, some neuropathies.

## 2021-03-21 NOTE — Telephone Encounter (Signed)
Called and spoke w/ pt about lab results. He verbalized understanding.

## 2021-03-21 NOTE — Telephone Encounter (Signed)
-----   Message from Larey Seat, MD sent at 03/21/2021  1:00 PM EDT ----- Last pending test : negative for ANA ( anti nuclear antibody), which is associated with lupus, RF vasculitis, some neuropathies.

## 2021-03-27 ENCOUNTER — Ambulatory Visit: Payer: PPO | Admitting: Neurology

## 2021-04-04 DIAGNOSIS — M069 Rheumatoid arthritis, unspecified: Secondary | ICD-10-CM | POA: Diagnosis not present

## 2021-04-04 DIAGNOSIS — Z79899 Other long term (current) drug therapy: Secondary | ICD-10-CM | POA: Diagnosis not present

## 2021-04-04 DIAGNOSIS — M19049 Primary osteoarthritis, unspecified hand: Secondary | ICD-10-CM | POA: Diagnosis not present

## 2021-04-04 DIAGNOSIS — J449 Chronic obstructive pulmonary disease, unspecified: Secondary | ICD-10-CM | POA: Diagnosis not present

## 2021-04-04 DIAGNOSIS — M1990 Unspecified osteoarthritis, unspecified site: Secondary | ICD-10-CM | POA: Diagnosis not present

## 2021-04-04 DIAGNOSIS — G629 Polyneuropathy, unspecified: Secondary | ICD-10-CM | POA: Diagnosis not present

## 2021-04-04 DIAGNOSIS — M25561 Pain in right knee: Secondary | ICD-10-CM | POA: Diagnosis not present

## 2021-04-04 DIAGNOSIS — E119 Type 2 diabetes mellitus without complications: Secondary | ICD-10-CM | POA: Diagnosis not present

## 2021-04-19 DIAGNOSIS — Z23 Encounter for immunization: Secondary | ICD-10-CM | POA: Diagnosis not present

## 2021-05-02 DIAGNOSIS — E113293 Type 2 diabetes mellitus with mild nonproliferative diabetic retinopathy without macular edema, bilateral: Secondary | ICD-10-CM | POA: Diagnosis not present

## 2021-05-02 DIAGNOSIS — H353122 Nonexudative age-related macular degeneration, left eye, intermediate dry stage: Secondary | ICD-10-CM | POA: Diagnosis not present

## 2021-05-24 ENCOUNTER — Ambulatory Visit: Payer: PPO | Admitting: Family Medicine

## 2021-05-31 DIAGNOSIS — U071 COVID-19: Secondary | ICD-10-CM | POA: Diagnosis not present

## 2021-07-03 DIAGNOSIS — K5909 Other constipation: Secondary | ICD-10-CM | POA: Diagnosis not present

## 2021-07-04 DIAGNOSIS — M545 Low back pain, unspecified: Secondary | ICD-10-CM | POA: Diagnosis not present

## 2021-07-04 DIAGNOSIS — M25552 Pain in left hip: Secondary | ICD-10-CM | POA: Diagnosis not present

## 2021-07-04 DIAGNOSIS — M25551 Pain in right hip: Secondary | ICD-10-CM | POA: Diagnosis not present

## 2021-08-13 DIAGNOSIS — M19049 Primary osteoarthritis, unspecified hand: Secondary | ICD-10-CM | POA: Diagnosis not present

## 2021-08-13 DIAGNOSIS — M79642 Pain in left hand: Secondary | ICD-10-CM | POA: Diagnosis not present

## 2021-08-13 DIAGNOSIS — M1990 Unspecified osteoarthritis, unspecified site: Secondary | ICD-10-CM | POA: Diagnosis not present

## 2021-08-13 DIAGNOSIS — Z79899 Other long term (current) drug therapy: Secondary | ICD-10-CM | POA: Diagnosis not present

## 2021-08-13 DIAGNOSIS — M25561 Pain in right knee: Secondary | ICD-10-CM | POA: Diagnosis not present

## 2021-08-13 DIAGNOSIS — J449 Chronic obstructive pulmonary disease, unspecified: Secondary | ICD-10-CM | POA: Diagnosis not present

## 2021-08-13 DIAGNOSIS — E119 Type 2 diabetes mellitus without complications: Secondary | ICD-10-CM | POA: Diagnosis not present

## 2021-08-13 DIAGNOSIS — M069 Rheumatoid arthritis, unspecified: Secondary | ICD-10-CM | POA: Diagnosis not present

## 2021-08-13 DIAGNOSIS — G629 Polyneuropathy, unspecified: Secondary | ICD-10-CM | POA: Diagnosis not present

## 2021-08-13 DIAGNOSIS — M79641 Pain in right hand: Secondary | ICD-10-CM | POA: Diagnosis not present

## 2021-09-05 DIAGNOSIS — M545 Low back pain, unspecified: Secondary | ICD-10-CM | POA: Diagnosis not present

## 2021-09-07 DIAGNOSIS — E559 Vitamin D deficiency, unspecified: Secondary | ICD-10-CM | POA: Diagnosis not present

## 2021-09-07 DIAGNOSIS — D649 Anemia, unspecified: Secondary | ICD-10-CM | POA: Diagnosis not present

## 2021-09-07 DIAGNOSIS — Z0000 Encounter for general adult medical examination without abnormal findings: Secondary | ICD-10-CM | POA: Diagnosis not present

## 2021-09-07 DIAGNOSIS — E1169 Type 2 diabetes mellitus with other specified complication: Secondary | ICD-10-CM | POA: Diagnosis not present

## 2021-09-07 DIAGNOSIS — E785 Hyperlipidemia, unspecified: Secondary | ICD-10-CM | POA: Diagnosis not present

## 2021-09-10 DIAGNOSIS — E1140 Type 2 diabetes mellitus with diabetic neuropathy, unspecified: Secondary | ICD-10-CM | POA: Diagnosis not present

## 2021-09-10 DIAGNOSIS — E785 Hyperlipidemia, unspecified: Secondary | ICD-10-CM | POA: Diagnosis not present

## 2021-09-10 DIAGNOSIS — M1990 Unspecified osteoarthritis, unspecified site: Secondary | ICD-10-CM | POA: Diagnosis not present

## 2021-09-10 DIAGNOSIS — D649 Anemia, unspecified: Secondary | ICD-10-CM | POA: Diagnosis not present

## 2021-09-10 DIAGNOSIS — I10 Essential (primary) hypertension: Secondary | ICD-10-CM | POA: Diagnosis not present

## 2021-09-10 DIAGNOSIS — M069 Rheumatoid arthritis, unspecified: Secondary | ICD-10-CM | POA: Diagnosis not present

## 2021-09-10 DIAGNOSIS — Z0000 Encounter for general adult medical examination without abnormal findings: Secondary | ICD-10-CM | POA: Diagnosis not present

## 2021-09-10 DIAGNOSIS — J45909 Unspecified asthma, uncomplicated: Secondary | ICD-10-CM | POA: Diagnosis not present

## 2021-09-11 ENCOUNTER — Ambulatory Visit: Payer: PPO | Admitting: Neurology

## 2021-09-11 ENCOUNTER — Encounter: Payer: Self-pay | Admitting: Neurology

## 2021-09-11 VITALS — BP 127/76 | HR 98 | Ht 69.0 in | Wt 207.0 lb

## 2021-09-11 DIAGNOSIS — M0579 Rheumatoid arthritis with rheumatoid factor of multiple sites without organ or systems involvement: Secondary | ICD-10-CM | POA: Diagnosis not present

## 2021-09-11 DIAGNOSIS — G6289 Other specified polyneuropathies: Secondary | ICD-10-CM

## 2021-09-11 NOTE — Patient Instructions (Signed)
Peripheral Neuropathy ?Peripheral neuropathy is a type of nerve damage. It affects nerves that carry signals between the spinal cord and the arms, legs, and the rest of the body (peripheral nerves). It does not affect nerves in the spinal cord or brain. In peripheral neuropathy, one nerve or a group of nerves may be damaged. Peripheral neuropathy is a broad category that includes many specific nerve disorders, like diabetic neuropathy, hereditary neuropathy, and carpal tunnel syndrome. ?What are the causes? ?This condition may be caused by: ?Diabetes. This is the most common cause of peripheral neuropathy. ?Nerve injury. ?Pressure or stress on a nerve that lasts a long time. ?Lack (deficiency) of B vitamins. This can result from alcoholism, poor diet, or a restricted diet. ?Infections. ?Autoimmune diseases, such as rheumatoid arthritis and systemic lupus erythematosus. ?Nerve diseases that are passed from parent to child (inherited). ?Some medicines, such as cancer medicines (chemotherapy). ?Poisonous (toxic) substances, such as lead and mercury. ?Too little blood flowing to the legs. ?Kidney disease. ?Thyroid disease. ?In some cases, the cause of this condition is not known. ?What are the signs or symptoms? ?Symptoms of this condition depend on which of your nerves is damaged. Common symptoms include: ?Loss of feeling (numbness) in the feet, hands, or both. ?Tingling in the feet, hands, or both. ?Burning pain. ?Very sensitive skin. ?Weakness. ?Not being able to move a part of the body (paralysis). ?Muscle twitching. ?Clumsiness or poor coordination. ?Loss of balance. ?Not being able to control your bladder. ?Feeling dizzy. ?Sexual problems. ?How is this diagnosed? ?Diagnosing and finding the cause of peripheral neuropathy can be difficult. Your health care provider will take your medical history and do a physical exam. A neurological exam will also be done. This involves checking things that are affected by your  brain, spinal cord, and nerves (nervous system). For example, your health care provider will check your reflexes, how you move, and what you can feel. ?You may have other tests, such as: ?Blood tests. ?Electromyogram (EMG) and nerve conduction tests. These tests check nerve function and how well the nerves are controlling the muscles. ?Imaging tests, such as CT scans or MRI to rule out other causes of your symptoms. ?Removing a small piece of nerve to be examined in a lab (nerve biopsy). ?Removing and examining a small amount of the fluid that surrounds the brain and spinal cord (lumbar puncture). ?How is this treated? ?Treatment for this condition may involve: ?Treating the underlying cause of the neuropathy, such as diabetes, kidney disease, or vitamin deficiencies. ?Stopping medicines that can cause neuropathy, such as chemotherapy. ?Medicine to help relieve pain. Medicines may include: ?Prescription or over-the-counter pain medicine. ?Antiseizure medicine. ?Antidepressants. ?Pain-relieving patches that are applied to painful areas of skin. ?Surgery to relieve pressure on a nerve or to destroy a nerve that is causing pain. ?Physical therapy to help improve movement and balance. ?Devices to help you move around (assistive devices). ?Follow these instructions at home: ?Medicines ?Take over-the-counter and prescription medicines only as told by your health care provider. Do not take any other medicines without first asking your health care provider. ?Do not drive or use heavy machinery while taking prescription pain medicine. ?Lifestyle ? ?Do not use any products that contain nicotine or tobacco, such as cigarettes and e-cigarettes. Smoking keeps blood from reaching damaged nerves. If you need help quitting, ask your health care provider. ?Avoid or limit alcohol. Too much alcohol can cause a vitamin B deficiency, and vitamin B is needed for healthy nerves. ?Eat a   healthy diet. This includes: ?Eating foods that are  high in fiber, such as fresh fruits and vegetables, whole grains, and beans. ?Limiting foods that are high in fat and processed sugars, such as fried or sweet foods. ?General instructions ? ?If you have diabetes, work closely with your health care provider to keep your blood sugar under control. ?If you have numbness in your feet: ?Check every day for signs of injury or infection. Watch for redness, warmth, and swelling. ?Wear padded socks and comfortable shoes. These help protect your feet. ?Develop a good support system. Living with peripheral neuropathy can be stressful. Consider talking with a mental health specialist or joining a support group. ?Use assistive devices and attend physical therapy as told by your health care provider. This may include using a walker or a cane. ?Keep all follow-up visits as told by your health care provider. This is important. ?Contact a health care provider if: ?You have new signs or symptoms of peripheral neuropathy. ?You are struggling emotionally from dealing with peripheral neuropathy. ?Your pain is not well-controlled. ?Get help right away if: ?You have an injury or infection that is not healing normally. ?You develop new weakness in an arm or leg. ?You have fallen or do so frequently. ?Summary ?Peripheral neuropathy is when the nerves in the arms, or legs are damaged, resulting in numbness, weakness, or pain. ?There are many causes of peripheral neuropathy, including diabetes, pinched nerves, vitamin deficiencies, autoimmune disease, and hereditary conditions. ?Diagnosing and finding the cause of peripheral neuropathy can be difficult. Your health care provider will take your medical history, do a physical exam, and do tests, including blood tests and nerve function tests. ?Treatment involves treating the underlying cause of the neuropathy and taking medicines to help control pain. Physical therapy and assistive devices may also help. ?This information is not intended to  replace advice given to you by your health care provider. Make sure you discuss any questions you have with your health care provider. ?Document Revised: 04/11/2020 Document Reviewed: 04/11/2020 ?Elsevier Patient Education ? 2022 [employer] ? ?

## 2021-09-11 NOTE — Progress Notes (Signed)
S[facility]EP [facility]IC   Provider:  Larey Seat, M D  Primary Care Physician:  London Pepper, MD   Referring Provider: Maurice Small, MD   09-11-2021: [name] is a 86 y.o. male patient with neuropathy and gait disorder. He is still bothered by his neuropathy , the feeling of waking on pebbles, tingling, and yet numb. His feet feel heavy.   He continues to use a stationary bike.  His beloved walks around [place] have been impossible, he feels too heavy.  Dr Jannifer Franklin did examine him by NCV and EMG. Axonal neuropathy was diagnosed. His rheumatology panel was normal, he has carried a dx of RA- Dr Charlynne Cousins. He  has plantar fascitis.  He also had Covid in November around thanksgiving, felt more tired, no critically illness resulted.         03-12-2021, RV for  [name] is a 86 y.o. male patient with neuropathy and gait disorder.  Uses a cane for the last 10 years. No pain- numbness. He likes to keep moving, uses an stationary bike and looks always for a seat , any opportunity to sit down when walking. Standing for long time feels unsafe. He neuropathy makes in unsafe to walk barefoot.  I connected with [name] on 10-14-2018 at  1:30 PM EST by telephone and verified that I am speaking with the correct person using two identifiers. I discussed the limitations, risks, security and privacy concerns of performing an evaluation and management service by telephone and the availability of in person appointments. I also discussed with the patient that there may be a patient responsible charge related to this service. The patient expressed understanding and agreed to proceed.   HPI:  [name] is a 86 y.o. male patient I spoke to by telephone encounter on  10-14-2018, patient doing better on steroid taper and methotrexate - Patient feels he finally received the right diagnosis  through rheumatology- and is .doing well.  He is still bothered by his neuropathy , the  feeling of waking on pebbles, tingling, and yet numb. His feet feel heavy.  Dr Jannifer Franklin did examine him by NCV and EMG. Axonal neuropathy was diagnosed.  He briefly spoke about the remote results of his MRI of the back.  He states that visit diagnosis he has regained a good appetite on methotrexate and folic acid he still has a diabetes mellitus and hypertension which are medication controlled but it seems that diabetes was not the only cause for neuropathy.  He reports that he is doing okay but he feels numb his legs feel heavy especially his feet and often when he wakes up in the morning he is somewhat stiff.  He does not have pain while in bed or during the night.  He reports difficulties when he walks in a longer big box store the heaviness may indicate weakness.  He feels better when he can lean on a shopping cart.  He denies any recent falls.  His most recent HbA1c was 6.2.  He is followed by Dr. Cindy Hazy rheumatology at [facility].  She also prepared him that his hands may be affected by the diagnosis.    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  He is ANA positive and rheumatoid factor  positive he had stopped statins in order to improve his leg strength but now walks better on methotrexate.  His arthropathy has much improved.  I would like the patient to continue his rheumatological regimen I will be happy to see him as needed the patient was agreeable to a visit in 6 months or earlier if needed.  I am especially happy that he did not have any falls.    Notes recorded by Larey Seat, MD on 02/25/2018 at 6:16 PM EDT Most likely origin for this neuropathy is an endocrine disorder, vitamin deficiency or abnormal plasmaprotein. -----Notes recorded by  Jarvis Sawa, Asencion Partridge, MD on 02/25/2018 at 6:10 PM EDT [name] is a 86 year old gentleman with a 7-year history of some numbness in the feet and some mild gait instability.  He reports no back pain or pain down the legs. He is treated on steroids and has developed higher blood glucose levels.   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 EMG evaluation of the right lower extremity shows mild chronic stable distal signs of denervation consistent with a diagnosis of peripheral neuropathy. No evidence of an overlying lumbosacral radiculopathy was seen.  James Alexanders MD 02/25/2018 1:47 PM Chief complaint according to patient : " Heavy feet, and a bit of tension headaches" .  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  He had stopped statins in order to improve his leg strength, but now walks better on methotraxate.  Social history:  Married, originally from [place], non smoker now, non ETOH,     [employer]chant [employer] officer, MD  Review of Systems: Out of a complete 14 system review, the patient complains of only the following symptoms, and all other reviewed systems are negative.  Low back pain, wider based gait, increased  fall risk-but no falls in 6 month. . Feet are numb.     Social History   Socioeconomic History   Marital status: Married    Spouse name: [name]   Number of children: 3   Years of education: 13   Highest education level: Not on file  Occupational History   Occupation: Retired  Tobacco Use   Smoking status: Former    Packs/day: 0.50    Years: 60.00    Pack years: 30.00    Types:  Cigarettes    Quit date: 04/07/2014    Years since quitting: 7.4   Smokeless tobacco: Never  Substance and Sexual Activity   Alcohol use: Yes    Alcohol/week: 14.0 standard drinks    Types: 14 Standard drinks or equivalent per week    Comment: 1-2 before dinner   Drug use: No   Sexual activity: Not on file  Other Topics Concern   Not on file  Social History Narrative   Patient is married [name]).   Patient has three children and 7 grandchildren.   Patient is retired.   Patient drinks one serving of coffee daily.   Patient is right-handed.   Patient has a college education.   Social Determinants of Health   Financial Resource Strain: Not on file  Food Insecurity: Not on file  Transportation Needs: Not on file  Physical Activity: Not on file  Stress: Not on file  Social Connections: Not on file  Intimate Partner Violence: Not on file    Family History  Problem Relation Age of Onset   Stroke Father     Past Medical History:  Diagnosis Date   Asthma    Bronchitis    COPD (chronic obstructive pulmonary disease) ([place])    Degenerative cervical disc    Diabetes mellitus    Diabetic peripheral neuropathy ([place]) 02/25/2018   Hematoma    left subdoral   Hypercholesterolemia    Hypertension    Iliac artery aneurysm ([facility])    Lung nodule    Macular degeneration    Peripheral neuropathy    Progressive gait disorder    Progressive gait disorder 09/15/2013   Skull fracture ([facility])    Subdural hematoma     History reviewed. No pertinent surgical history.  Current Outpatient Medications  Medication Sig Dispense Refill   albuterol (PROVENTIL HFA;VENTOLIN HFA) 108 (90 BASE) MCG/ACT inhaler Inhale 2 puffs into the lungs every 6 (six) hours as needed for wheezing or shortness of breath.     Ascorbic Acid (VITAMIN C PO) Take 1 tablet by mouth daily.     aspirin 81 MG tablet Take 81 mg by mouth daily.     cetirizine (ZYRTEC) 10 MG tablet Take 10 mg by mouth daily.     famotidine  (PEPCID) 20 MG tablet One at bedtime (Patient taking differently: Take 20 mg by mouth at bedtime.)     finasteride (PROSCAR) 5 MG tablet Take 5 mg by mouth daily.     folic acid (FOLVITE) 1 MG tablet Take 1 mg by mouth daily.     gabapentin (NEURONTIN) 100 MG capsule Take 100 mg by mouth 3 (three) times daily.     glipiZIDE (GLUCOTROL XL) 10 MG 24 hr tablet Take 10 mg by mouth daily.     losartan (COZAAR) 100 MG tablet Take 100 mg by mouth daily.     metFORMIN (GLUCOPHAGE) 500 MG tablet Take 1 tablet (500 mg total) by mouth 2 (two) times daily with a meal. Resume tonight.     methotrexate 2.5 MG tablet Take 2.5 mg by mouth once a week. Take 6 tablets orally once a week     Multiple Vitamin (MULTIVITAMIN WITH MINERALS) TABS Take 1 tablet by mouth daily.     omeprazole (PRILOSEC) 40 MG capsule Take 40 mg by mouth daily.     ONE TOUCH ULTRA TEST test strip      ONETOUCH DELICA LANCETS 26Z MISC      SYMBICORT 160-4.5 MCG/ACT inhaler INHALE 2 PUFFS INTO THE LUNGS 2 TIMES DAILY (Patient taking differently: Inhale 2 puffs into the lungs 2 (two) times daily.) 1 Inhaler 11   Tamsulosin HCl (FLOMAX) 0.4 MG CAPS Take 0.4 mg by mouth daily.     No current facility-administered medications for this visit.    Allergies as of 09/11/2021 - Review Complete 09/11/2021  Allergen Reaction Noted   Lisinopril Cough 12/31/2017   Other Other (See Comments) 08/18/2020   Pneumococcal vac polyvalent Other (See Comments) 09/10/2013   Prednisone Other (See Comments) 08/03/2011   Prevnar 13 [pneumococcal 13-val conj vacc] Swelling 09/13/2014   Penicillins Rash 08/03/2011   Sulfa antibiotics Rash 08/03/2011    Vitals: BP 127/76    Pulse 98    Ht 5\' 9"  (1.753 m)    Wt 207 lb (93.9 kg)    BMI 30.57 kg/m  Last Weight:  Wt Readings from Last 1 Encounters:  09/11/21 207 lb (93.9 kg)   ZOX:WRUE mass index is 30.57 kg/m.     Last Height:   Ht Readings from Last 1 Encounters:  09/11/21 5\' 9"  (1.753 m)       Attention span & concentration ability appears normal.  Speech is fluent,  without dysarthria, dysphonia or aphasia.  Motor : strong hip flexion, adduction, abduction, and knee flexion, and dorsiflexion.  Sensory: absent vibration in both ankles, feet.  No edema.    Assessment:  Neuropathy, progressive. Some osteoarthritis , some rheumatoid arthritis.    Plan:  Treatment plan and additional workup :  Peripheral axonal Neuropathy-   Prevent progression by taking Vit B 12 , Vit D, and keep moving-  Exercise on a stationary bike is fine. Use the cane when walking.   Rheumatoid arthritis is a likely explanation . Discussed the assessment and treatment plan with the patient. The patient was provided an opportunity to ask questions and all were answered. The patient agreed with the plan and demonstrated an understanding of the instructions.   The patient was advised to call back or seek an in-person evaluation if the symptoms worsen or if the condition fails to improve as anticipated. Rv in 12  months with  NP Encouraging exercise.     I provided 25 minutes of non-face-to-face time during this encounter.   Larey Seat, MD  [phone] 1:91 PM  Certified in Neurology by ABPN Certified in [place] by [facility] [address] [place], [place] [address]

## 2021-10-11 DIAGNOSIS — Z85828 Personal history of other malignant neoplasm of skin: Secondary | ICD-10-CM | POA: Diagnosis not present

## 2021-10-11 DIAGNOSIS — L570 Actinic keratosis: Secondary | ICD-10-CM | POA: Diagnosis not present

## 2021-10-15 DIAGNOSIS — M79642 Pain in left hand: Secondary | ICD-10-CM | POA: Diagnosis not present

## 2021-10-15 DIAGNOSIS — Z79899 Other long term (current) drug therapy: Secondary | ICD-10-CM | POA: Diagnosis not present

## 2021-10-15 DIAGNOSIS — J449 Chronic obstructive pulmonary disease, unspecified: Secondary | ICD-10-CM | POA: Diagnosis not present

## 2021-10-15 DIAGNOSIS — M79641 Pain in right hand: Secondary | ICD-10-CM | POA: Diagnosis not present

## 2021-10-15 DIAGNOSIS — G629 Polyneuropathy, unspecified: Secondary | ICD-10-CM | POA: Diagnosis not present

## 2021-10-15 DIAGNOSIS — M1990 Unspecified osteoarthritis, unspecified site: Secondary | ICD-10-CM | POA: Diagnosis not present

## 2021-10-15 DIAGNOSIS — M19049 Primary osteoarthritis, unspecified hand: Secondary | ICD-10-CM | POA: Diagnosis not present

## 2021-10-15 DIAGNOSIS — E119 Type 2 diabetes mellitus without complications: Secondary | ICD-10-CM | POA: Diagnosis not present

## 2021-10-15 DIAGNOSIS — M069 Rheumatoid arthritis, unspecified: Secondary | ICD-10-CM | POA: Diagnosis not present

## 2021-11-05 DIAGNOSIS — M25552 Pain in left hip: Secondary | ICD-10-CM | POA: Diagnosis not present

## 2021-11-20 DIAGNOSIS — D649 Anemia, unspecified: Secondary | ICD-10-CM | POA: Diagnosis not present

## 2021-11-20 DIAGNOSIS — E785 Hyperlipidemia, unspecified: Secondary | ICD-10-CM | POA: Diagnosis not present

## 2021-11-20 DIAGNOSIS — E1140 Type 2 diabetes mellitus with diabetic neuropathy, unspecified: Secondary | ICD-10-CM | POA: Diagnosis not present

## 2021-11-20 DIAGNOSIS — I10 Essential (primary) hypertension: Secondary | ICD-10-CM | POA: Diagnosis not present

## 2021-11-20 DIAGNOSIS — J45909 Unspecified asthma, uncomplicated: Secondary | ICD-10-CM | POA: Diagnosis not present

## 2021-12-25 DIAGNOSIS — H353132 Nonexudative age-related macular degeneration, bilateral, intermediate dry stage: Secondary | ICD-10-CM | POA: Diagnosis not present

## 2021-12-25 DIAGNOSIS — Z961 Presence of intraocular lens: Secondary | ICD-10-CM | POA: Diagnosis not present

## 2021-12-25 DIAGNOSIS — H52203 Unspecified astigmatism, bilateral: Secondary | ICD-10-CM | POA: Diagnosis not present

## 2021-12-25 DIAGNOSIS — E113293 Type 2 diabetes mellitus with mild nonproliferative diabetic retinopathy without macular edema, bilateral: Secondary | ICD-10-CM | POA: Diagnosis not present

## 2022-01-08 DIAGNOSIS — E1140 Type 2 diabetes mellitus with diabetic neuropathy, unspecified: Secondary | ICD-10-CM | POA: Diagnosis not present

## 2022-01-08 DIAGNOSIS — E785 Hyperlipidemia, unspecified: Secondary | ICD-10-CM | POA: Diagnosis not present

## 2022-01-08 DIAGNOSIS — I10 Essential (primary) hypertension: Secondary | ICD-10-CM | POA: Diagnosis not present

## 2022-01-08 DIAGNOSIS — D649 Anemia, unspecified: Secondary | ICD-10-CM | POA: Diagnosis not present

## 2022-01-17 DIAGNOSIS — N401 Enlarged prostate with lower urinary tract symptoms: Secondary | ICD-10-CM | POA: Diagnosis not present

## 2022-01-17 DIAGNOSIS — R3914 Feeling of incomplete bladder emptying: Secondary | ICD-10-CM | POA: Diagnosis not present

## 2022-01-17 DIAGNOSIS — N3000 Acute cystitis without hematuria: Secondary | ICD-10-CM | POA: Diagnosis not present

## 2022-01-22 DIAGNOSIS — E119 Type 2 diabetes mellitus without complications: Secondary | ICD-10-CM | POA: Diagnosis not present

## 2022-01-22 DIAGNOSIS — G629 Polyneuropathy, unspecified: Secondary | ICD-10-CM | POA: Diagnosis not present

## 2022-01-22 DIAGNOSIS — M069 Rheumatoid arthritis, unspecified: Secondary | ICD-10-CM | POA: Diagnosis not present

## 2022-01-22 DIAGNOSIS — J449 Chronic obstructive pulmonary disease, unspecified: Secondary | ICD-10-CM | POA: Diagnosis not present

## 2022-01-22 DIAGNOSIS — Z79899 Other long term (current) drug therapy: Secondary | ICD-10-CM | POA: Diagnosis not present

## 2022-01-22 DIAGNOSIS — M79641 Pain in right hand: Secondary | ICD-10-CM | POA: Diagnosis not present

## 2022-01-22 DIAGNOSIS — M1990 Unspecified osteoarthritis, unspecified site: Secondary | ICD-10-CM | POA: Diagnosis not present

## 2022-01-22 DIAGNOSIS — M19049 Primary osteoarthritis, unspecified hand: Secondary | ICD-10-CM | POA: Diagnosis not present

## 2022-01-22 DIAGNOSIS — M79642 Pain in left hand: Secondary | ICD-10-CM | POA: Diagnosis not present

## 2022-02-04 ENCOUNTER — Encounter: Payer: Self-pay | Admitting: Family Medicine

## 2022-02-04 ENCOUNTER — Ambulatory Visit: Payer: PPO | Admitting: Family Medicine

## 2022-02-04 VITALS — BP 144/90 | HR 96 | Ht 69.0 in | Wt 204.0 lb

## 2022-02-04 DIAGNOSIS — G63 Polyneuropathy in diseases classified elsewhere: Secondary | ICD-10-CM

## 2022-02-04 DIAGNOSIS — E889 Metabolic disorder, unspecified: Secondary | ICD-10-CM

## 2022-02-04 MED ORDER — GABAPENTIN 100 MG PO CAPS: 100.0000 mg | ORAL_CAPSULE | Freq: Every day | ORAL | 3 refills | Status: DC

## 2022-02-04 NOTE — Progress Notes (Signed)
Chief Complaint  Patient presents with   Follow-up    Rm 16, w wife. Per pt neuropathy has worsened. Worse in the AM and standing. Moving around helps.     HISTORY OF PRESENT ILLNESS:  02/04/22 ALL: [name] returns for worsening peripheral neuropathy. He was last seen by Dr Brett Fairy 08/2021 and had concerns of feeling pebbles under feet with tingling and numbness. Per note, likely cause was RA. Rheumatology panel negative 02/2021. He continues methotrexate. He tells me, today, that he has had trouble with pain in the arch of both feet in the mornings when waking. He has to be careful with walking in the mornings but it gets better if he moves around. He reports a pressure sensation. No burning. Tingling is mild and not worsening. He can feel his feet normally. He is followed by podiatry for plantar fasciitis. He was given gabapentin '100mg'$  TID but only took it once about 2 years ago.   09/11/2021 CD: [name] is a 86 y.o. male patient with neuropathy and gait disorder. He is still bothered by his neuropathy , the feeling of waking on pebbles, tingling, and yet numb. His feet feel heavy.   He continues to use a stationary bike.  His beloved walks around [place] have been impossible, he feels too heavy.  Dr Jannifer Franklin did examine him by NCV and EMG. Axonal neuropathy was diagnosed. His rheumatology panel was normal, he has carried a dx of RA- Dr Charlynne Cousins. He  has plantar fascitis.  He also had Covid in November around thanksgiving, felt more tired, no critically illness resulted.   05/24/2020 ALL: [name] is a 86 y.o. male here today for follow up for axonal peripheral neuropathy. He was prescribed gabapentin '100mg'$  TID by PCP. He took 1 dose and did not feel much benefit so he did not continue medication. He reports that neuropathy is fairly stable. He does have waxing and waning symptoms. He denies pain but described an uncomfortable numbness sensation. He does feel a little  off balance from time to time. No falls. He uses a cane. He does not feel PT would be beneficial at this time. He continues to follow up with his care team regularly. A1C was 7.1. he is working to control BP. He is exercising daily on his stationary bike. He tries to stay active.    04/20/2019 ALL: [name] is a 86 y.o. male here today for follow up for neuropathy.  He feels that he is doing quite well.   He is present today with his wife who agrees.  He does continue to have some heaviness in his feet.  He is able to exercise daily.  He walks and rides a stationary bike.  He continues close follow-up with rheumatology on methotrexate and prednisone.  He is not interested in taking medications to help with neuropathy at this time.  He feels that overall he is doing well and without concerns today.    REVIEW OF SYSTEMS: Out of a complete 14 system review of symptoms, the patient complains only of the following symptoms, neuropathy, foot pain,  and all other reviewed systems are negative.   ALLERGIES: Allergies  Allergen Reactions   Lisinopril Cough   Other Other (See Comments)   Pneumococcal Vac Polyvalent Other (See Comments)    Fever, sweats   Prednisone Other (See Comments)    Sweating with high dose   Prevnar 13 [Pneumococcal 13-Val Conj Vacc] Swelling   Penicillins Rash  Sulfa Antibiotics Rash     HOME MEDICATIONS: Outpatient Medications Prior to Visit  Medication Sig Dispense Refill   albuterol (PROVENTIL HFA;VENTOLIN HFA) 108 (90 BASE) MCG/ACT inhaler Inhale 2 puffs into the lungs every 6 (six) hours as needed for wheezing or shortness of breath.     Ascorbic Acid (VITAMIN C PO) Take 1 tablet by mouth daily.     aspirin 81 MG tablet Take 81 mg by mouth daily.     cetirizine (ZYRTEC) 10 MG tablet Take 10 mg by mouth daily.     famotidine (PEPCID) 20 MG tablet One at bedtime (Patient taking differently: Take 20 mg by mouth at bedtime.)     finasteride (PROSCAR) 5 MG tablet  Take 5 mg by mouth daily.     folic acid (FOLVITE) 1 MG tablet Take 1 mg by mouth daily.     gabapentin (NEURONTIN) 100 MG capsule Take 100 mg by mouth 3 (three) times daily.     glipiZIDE (GLUCOTROL XL) 10 MG 24 hr tablet Take 10 mg by mouth daily.     losartan (COZAAR) 100 MG tablet Take 100 mg by mouth daily.     metFORMIN (GLUCOPHAGE) 500 MG tablet Take 1 tablet (500 mg total) by mouth 2 (two) times daily with a meal. Resume tonight.     methotrexate 2.5 MG tablet Take 2.5 mg by mouth once a week. Take 6 tablets orally once a week     Multiple Vitamin (MULTIVITAMIN WITH MINERALS) TABS Take 1 tablet by mouth daily.     omeprazole (PRILOSEC) 40 MG capsule Take 40 mg by mouth daily.     ONE TOUCH ULTRA TEST test strip      ONETOUCH DELICA LANCETS 82N MISC      SYMBICORT 160-4.5 MCG/ACT inhaler INHALE 2 PUFFS INTO THE LUNGS 2 TIMES DAILY (Patient taking differently: Inhale 2 puffs into the lungs 2 (two) times daily.) 1 Inhaler 11   Tamsulosin HCl (FLOMAX) 0.4 MG CAPS Take 0.4 mg by mouth daily.     No facility-administered medications prior to visit.     PAST MEDICAL HISTORY: Past Medical History:  Diagnosis Date   Asthma    Bronchitis    COPD (chronic obstructive pulmonary disease) ([place])    Degenerative cervical disc    Diabetes mellitus    Diabetic peripheral neuropathy ([place]) 02/25/2018   Hematoma    left subdoral   Hypercholesterolemia    Hypertension    Iliac artery aneurysm (H[facility])    Lung nodule    Macular degeneration    Peripheral neuropathy    Progressive gait disorder    Progressive gait disorder 09/15/2013   Skull fracture ([facility])    Subdural hematoma ([place])      PAST SURGICAL HISTORY: History reviewed. No pertinent surgical history.   FAMILY HISTORY: Family History  Problem Relation Age of Onset   Stroke Father      SOCIAL HISTORY: Social History   Socioeconomic History   Marital status: Married    Spouse name: [name]   Number of children: 3   Years of  education: 13   Highest education level: Not on file  Occupational History   Occupation: Retired  Tobacco Use   Smoking status: Former    Packs/day: 0.50    Years: 60.00    Total pack years: 30.00    Types: Cigarettes    Quit date: 04/07/2014    Years since quitting: 7.8   Smokeless tobacco: Never  Substance and Sexual Activity  Alcohol use: Yes    Alcohol/week: 14.0 standard drinks of alcohol    Types: 14 Standard drinks or equivalent per week    Comment: 1-2 before dinner   Drug use: No   Sexual activity: Not on file  Other Topics Concern   Not on file  Social History Narrative   Patient is married [name]).   Patient has three children and 7 grandchildren.   Patient is retired.   Patient drinks one serving of coffee daily.   Patient is right-handed.   Patient has a college education.   Social Determinants of Health   Financial Resource Strain: Not on file  Food Insecurity: Not on file  Transportation Needs: Not on file  Physical Activity: Not on file  Stress: Not on file  Social Connections: Not on file  Intimate Partner Violence: Not on file      PHYSICAL EXAM  Vitals:   02/04/22 1321  BP: (!) 144/90  Pulse: 96  Weight: 204 lb (92.5 kg)  Height: '5\' 9"'$  (1.753 m)    Body mass index is 30.13 kg/m.   Generalized: Well developed, in no acute distress   Cardiology: normal rate and rhythm Respiratory: clear to auscultation bilaterally   Neurological examination  Mentation: Alert oriented to time, place, history taking. Follows all commands speech and language fluent Cranial nerve II-XII: Pupils were equal round reactive to light. Extraocular movements were full, visual field were full on confrontational test. Facial sensation and strength were normal. Head turning and shoulder shrug  were normal and symmetric. Motor: The motor testing reveals 5 over 5 strength of all 4 extremities. Good symmetric motor tone is noted throughout.  Sensory: Sensory testing is  intact to soft touch on all 4 extremities. No evidence of extinction is noted.  Gait and station: Gait is slightly wide. Uses cane for stability. Tandem not attempted, today.     DIAGNOSTIC DATA (LABS, IMAGING, TESTING) - I reviewed patient records, labs, notes, testing and imaging myself where available.  Lab Results  Component Value Date   WBC 7.9 03/12/2021   HGB 13.3 03/12/2021   HCT 39.7 03/12/2021   MCV 94 03/12/2021   PLT 263 03/12/2021      Component Value Date/Time   NA 133 (L) 03/12/2021 1623   K 4.5 03/12/2021 1623   CL 94 (L) 03/12/2021 1623   CO2 24 03/12/2021 1623   GLUCOSE 176 (H) 03/12/2021 1623   GLUCOSE 159 (H) 02/20/2020 0013   BUN 14 03/12/2021 1623   CREATININE 0.56 (L) 03/12/2021 1623   CREATININE 0.65 03/28/2014 1208   CALCIUM 9.2 03/12/2021 1623   PROT 6.8 03/12/2021 1623   ALBUMIN 4.6 03/12/2021 1623   AST 15 03/12/2021 1623   ALT 16 03/12/2021 1623   ALKPHOS 72 03/12/2021 1623   BILITOT 0.4 03/12/2021 1623   GFRNONAA >60 02/20/2020 0013   GFRAA >60 02/20/2020 0013   No results found for: "CHOL", "HDL", "LDLCALC", "LDLDIRECT", "TRIG", "CHOLHDL" Lab Results  Component Value Date   HGBA1C 8.5 (H) 09/07/2014   Lab Results  Component Value Date   VITAMINB12 1,352 (H) 09/07/2014   Lab Results  Component Value Date   TSH 1.750 03/12/2021      ASSESSMENT AND PLAN  86 y.o. year old male  has a past medical history of Asthma, Bronchitis, COPD (chronic obstructive pulmonary disease) ([place]), Degenerative cervical disc, Diabetes mellitus, Diabetic peripheral neuropathy ([place]) (02/25/2018), Hematoma, Hypercholesterolemia, Hypertension, Iliac artery aneurysm ([place]), Lung nodule, Macular degeneration, Peripheral neuropathy, Progressive  gait disorder, Progressive gait disorder (09/15/2013), Skull fracture ([place]), and Subdural hematoma ([place]). here with   Peripheral neuropathy due to disorder of metabolism [facility])  Merrill is doing well. He reports that  neuropathy waxes and wanes but, overall, is well managed. He was prescribed gabapentin but has been hesitant to use this. I have educated him on possible dosing options as well as possible side effects. I resubmitted gabapentin '100mg'$  QHS. I feel that pressure pain he described being worse after sitting for prolonged periods and in the mornings is most consistent with plantar fasciitis or an inflammatory process. I have advised he try Voltaren topically. He may consider visit with podiatry to assess as well. Healthy lifestyle habits encouraged. He will continue close follow up with care team. He will follow up with me as scheduled 08/2022. He and his wife verbalize understanding and agreement with this plan.    Debbora Presto, MSN, FNP-C 02/04/2022, 1:28 PM  [facility] [address] [place], [place] [address] [phone]

## 2022-02-04 NOTE — Patient Instructions (Signed)
Below is our plan:  We will continue to monitor symptoms. I am concerned that this may be more related to arthritis or plantar fasciitis. I would be ok with your trying gabapentin '100mg'$  at bedtime. You could also try Voltaren gel over the counter. You can get this from any pharmacy. I may would speak to your podiatrist to see if this sounds like plantar fasciitis. Wear good supportive shoes.   Please make sure you are staying well hydrated. I recommend 50-60 ounces daily. Well balanced diet and regular exercise encouraged. Consistent sleep schedule with 6-8 hours recommended.   Please continue follow up with care team as directed.   Follow up with me as planned in February 2024  You may receive a survey regarding today's visit. I encourage you to leave honest feed back as I do use this information to improve patient care. Thank you for seeing me today!

## 2022-03-04 DIAGNOSIS — E871 Hypo-osmolality and hyponatremia: Secondary | ICD-10-CM | POA: Diagnosis not present

## 2022-03-11 DIAGNOSIS — E871 Hypo-osmolality and hyponatremia: Secondary | ICD-10-CM | POA: Diagnosis not present

## 2022-03-21 DIAGNOSIS — E1140 Type 2 diabetes mellitus with diabetic neuropathy, unspecified: Secondary | ICD-10-CM | POA: Diagnosis not present

## 2022-03-21 DIAGNOSIS — M069 Rheumatoid arthritis, unspecified: Secondary | ICD-10-CM | POA: Diagnosis not present

## 2022-03-21 DIAGNOSIS — E871 Hypo-osmolality and hyponatremia: Secondary | ICD-10-CM | POA: Diagnosis not present

## 2022-03-21 DIAGNOSIS — R5381 Other malaise: Secondary | ICD-10-CM | POA: Diagnosis not present

## 2022-03-21 DIAGNOSIS — I10 Essential (primary) hypertension: Secondary | ICD-10-CM | POA: Diagnosis not present

## 2022-03-27 DIAGNOSIS — Z23 Encounter for immunization: Secondary | ICD-10-CM | POA: Diagnosis not present

## 2022-04-09 DIAGNOSIS — M069 Rheumatoid arthritis, unspecified: Secondary | ICD-10-CM | POA: Diagnosis not present

## 2022-04-09 DIAGNOSIS — R5383 Other fatigue: Secondary | ICD-10-CM | POA: Diagnosis not present

## 2022-04-09 DIAGNOSIS — E1140 Type 2 diabetes mellitus with diabetic neuropathy, unspecified: Secondary | ICD-10-CM | POA: Diagnosis not present

## 2022-04-18 DIAGNOSIS — M21961 Unspecified acquired deformity of right lower leg: Secondary | ICD-10-CM | POA: Diagnosis not present

## 2022-04-18 DIAGNOSIS — G629 Polyneuropathy, unspecified: Secondary | ICD-10-CM | POA: Diagnosis not present

## 2022-04-18 DIAGNOSIS — I739 Peripheral vascular disease, unspecified: Secondary | ICD-10-CM | POA: Diagnosis not present

## 2022-04-18 DIAGNOSIS — B351 Tinea unguium: Secondary | ICD-10-CM | POA: Diagnosis not present

## 2022-04-18 DIAGNOSIS — M205X2 Other deformities of toe(s) (acquired), left foot: Secondary | ICD-10-CM | POA: Diagnosis not present

## 2022-04-18 DIAGNOSIS — L603 Nail dystrophy: Secondary | ICD-10-CM | POA: Diagnosis not present

## 2022-04-18 DIAGNOSIS — E1142 Type 2 diabetes mellitus with diabetic polyneuropathy: Secondary | ICD-10-CM | POA: Diagnosis not present

## 2022-04-18 DIAGNOSIS — M21962 Unspecified acquired deformity of left lower leg: Secondary | ICD-10-CM | POA: Diagnosis not present

## 2022-04-24 DIAGNOSIS — M25559 Pain in unspecified hip: Secondary | ICD-10-CM | POA: Diagnosis not present

## 2022-04-24 DIAGNOSIS — M069 Rheumatoid arthritis, unspecified: Secondary | ICD-10-CM | POA: Diagnosis not present

## 2022-04-24 DIAGNOSIS — Z79899 Other long term (current) drug therapy: Secondary | ICD-10-CM | POA: Diagnosis not present

## 2022-04-24 DIAGNOSIS — M79641 Pain in right hand: Secondary | ICD-10-CM | POA: Diagnosis not present

## 2022-04-24 DIAGNOSIS — M19049 Primary osteoarthritis, unspecified hand: Secondary | ICD-10-CM | POA: Diagnosis not present

## 2022-04-24 DIAGNOSIS — M25552 Pain in left hip: Secondary | ICD-10-CM | POA: Diagnosis not present

## 2022-04-24 DIAGNOSIS — M25551 Pain in right hip: Secondary | ICD-10-CM | POA: Diagnosis not present

## 2022-04-24 DIAGNOSIS — J449 Chronic obstructive pulmonary disease, unspecified: Secondary | ICD-10-CM | POA: Diagnosis not present

## 2022-04-24 DIAGNOSIS — M1990 Unspecified osteoarthritis, unspecified site: Secondary | ICD-10-CM | POA: Diagnosis not present

## 2022-04-24 DIAGNOSIS — G629 Polyneuropathy, unspecified: Secondary | ICD-10-CM | POA: Diagnosis not present

## 2022-04-24 DIAGNOSIS — E119 Type 2 diabetes mellitus without complications: Secondary | ICD-10-CM | POA: Diagnosis not present

## 2022-05-06 DIAGNOSIS — R5383 Other fatigue: Secondary | ICD-10-CM | POA: Diagnosis not present

## 2022-05-06 DIAGNOSIS — E1140 Type 2 diabetes mellitus with diabetic neuropathy, unspecified: Secondary | ICD-10-CM | POA: Diagnosis not present

## 2022-05-06 DIAGNOSIS — E871 Hypo-osmolality and hyponatremia: Secondary | ICD-10-CM | POA: Diagnosis not present

## 2022-05-06 DIAGNOSIS — E559 Vitamin D deficiency, unspecified: Secondary | ICD-10-CM | POA: Diagnosis not present

## 2022-05-10 DIAGNOSIS — E871 Hypo-osmolality and hyponatremia: Secondary | ICD-10-CM | POA: Diagnosis not present

## 2022-05-15 NOTE — Progress Notes (Deleted)
No chief complaint on file.   HISTORY OF PRESENT ILLNESS:  05/15/22 ALL: Mr [name] returns for worsening peripheral neuropathy. He was last seen 01/2022. He had been hesitant to try gabapentin. He was encouraged to use '100mg'$  QHS and Volteran for arthritic pain.   02/04/2022 ALL: Mr [name] returns for worsening peripheral neuropathy. He was last seen by Dr Brett Fairy 08/2021 and had concerns of feeling pebbles under feet with tingling and numbness. Per note, likely cause was RA. Rheumatology panel negative 02/2021. He continues methotrexate. He tells me, today, that he has had trouble with pain in the arch of both feet in the mornings when waking. He has to be careful with walking in the mornings but it gets better if he moves around. He reports a pressure sensation. No burning. Tingling is mild and not worsening. He can feel his feet normally. He is followed by podiatry for plantar fasciitis. He was given gabapentin '100mg'$  TID but only took it once about 2 years ago.   09/11/2021 CD: [name] is a 86 y.o. male patient with neuropathy and gait disorder. He is still bothered by his neuropathy , the feeling of waking on pebbles, tingling, and yet numb. His feet feel heavy.   He continues to use a stationary bike.  His beloved walks around [place] have been impossible, he feels too heavy.  Dr Jannifer Franklin did examine him by NCV and EMG. Axonal neuropathy was diagnosed. His rheumatology panel was normal, he has carried a dx of RA- Dr Charlynne Cousins. He  has plantar fascitis.  He also had Covid in November around thanksgiving, felt more tired, no critically illness resulted.   05/24/2020 ALL: [name] is a 86 y.o. male here today for follow up for axonal peripheral neuropathy. He was prescribed gabapentin '100mg'$  TID by PCP. He took 1 dose and did not feel much benefit so he did not continue medication. He reports that neuropathy is fairly stable. He does have waxing and waning symptoms. He  denies pain but described an uncomfortable numbness sensation. He does feel a little off balance from time to time. No falls. He uses a cane. He does not feel PT would be beneficial at this time. He continues to follow up with his care team regularly. A1C was 7.1. he is working to control BP. He is exercising daily on his stationary bike. He tries to stay active.    04/20/2019 ALL: [name] is a 86 y.o. male here today for follow up for neuropathy.  He feels that he is doing quite well.   He is present today with his wife who agrees.  He does continue to have some heaviness in his feet.  He is able to exercise daily.  He walks and rides a stationary bike.  He continues close follow-up with rheumatology on methotrexate and prednisone.  He is not interested in taking medications to help with neuropathy at this time.  He feels that overall he is doing well and without concerns today.    REVIEW OF SYSTEMS: Out of a complete 14 system review of symptoms, the patient complains only of the following symptoms, neuropathy, foot pain,  and all other reviewed systems are negative.   ALLERGIES: Allergies  Allergen Reactions   Lisinopril Cough   Other Other (See Comments)   Pneumococcal Vac Polyvalent Other (See Comments)    Fever, sweats   Prednisone Other (See Comments)    Sweating with high dose   Prevnar 13 [Pneumococcal 13-Val Conj  Vacc] Swelling   Penicillins Rash   Sulfa Antibiotics Rash     HOME MEDICATIONS: Outpatient Medications Prior to Visit  Medication Sig Dispense Refill   albuterol (PROVENTIL HFA;VENTOLIN HFA) 108 (90 BASE) MCG/ACT inhaler Inhale 2 puffs into the lungs every 6 (six) hours as needed for wheezing or shortness of breath.     Ascorbic Acid (VITAMIN C PO) Take 1 tablet by mouth daily.     aspirin 81 MG tablet Take 81 mg by mouth daily.     cetirizine (ZYRTEC) 10 MG tablet Take 10 mg by mouth daily.     famotidine (PEPCID) 20 MG tablet One at bedtime (Patient taking  differently: Take 20 mg by mouth at bedtime.)     finasteride (PROSCAR) 5 MG tablet Take 5 mg by mouth daily.     folic acid (FOLVITE) 1 MG tablet Take 1 mg by mouth daily.     gabapentin (NEURONTIN) 100 MG capsule Take 1 capsule (100 mg total) by mouth at bedtime. 90 capsule 3   glipiZIDE (GLUCOTROL XL) 10 MG 24 hr tablet Take 10 mg by mouth daily.     losartan (COZAAR) 100 MG tablet Take 100 mg by mouth daily.     metFORMIN (GLUCOPHAGE) 500 MG tablet Take 1 tablet (500 mg total) by mouth 2 (two) times daily with a meal. Resume tonight.     methotrexate 2.5 MG tablet Take 2.5 mg by mouth once a week. Take 6 tablets orally once a week     Multiple Vitamin (MULTIVITAMIN WITH MINERALS) TABS Take 1 tablet by mouth daily.     omeprazole (PRILOSEC) 40 MG capsule Take 40 mg by mouth daily.     ONE TOUCH ULTRA TEST test strip      ONETOUCH DELICA LANCETS 89V MISC      SYMBICORT 160-4.5 MCG/ACT inhaler INHALE 2 PUFFS INTO THE LUNGS 2 TIMES DAILY (Patient taking differently: Inhale 2 puffs into the lungs 2 (two) times daily.) 1 Inhaler 11   Tamsulosin HCl (FLOMAX) 0.4 MG CAPS Take 0.4 mg by mouth daily.     No facility-administered medications prior to visit.     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   FAMILY HISTORY: Family History  Problem Relation Age of Onset   Stroke Father      SOCIAL HISTORY: Social History   Socioeconomic History    Marital status: Married    Spouse name: [name]   Number of children: 3   Years of education: 13   Highest education level: Not on file  Occupational History   Occupation: Retired  Tobacco Use   Smoking status: Former    Packs/day: 0.50    Years: 60.00    Total pack years: 30.00    Types: Cigarettes    Quit date: 04/07/2014    Years since quitting: 8.1  Smokeless tobacco: Never  Substance and Sexual Activity   Alcohol use: Yes    Alcohol/week: 14.0 standard drinks of alcohol    Types: 14 Standard drinks or equivalent per week    Comment: 1-2 before dinner   Drug use: No   Sexual activity: Not on file  Other Topics Concern   Not on file  Social History Narrative   Patient is married [name]).   Patient has three children and 7 grandchildren.   Patient is retired.   Patient drinks one serving of coffee daily.   Patient is right-handed.   Patient has a college education.   Social Determinants of Health   Financial Resource Strain: Not on file  Food Insecurity: Not on file  Transportation Needs: Not on file  Physical Activity: Not on file  Stress: Not on file  Social Connections: Not on file  Intimate Partner Violence: Not on file      PHYSICAL EXAM  There were no vitals filed for this visit.   There is no height or weight on file to calculate BMI.   Generalized: Well developed, in no acute distress   Cardiology: normal rate and rhythm Respiratory: clear to auscultation bilaterally   Neurological examination  Mentation: Alert oriented to time, place, history taking. Follows all commands speech and language fluent Cranial nerve II-XII: Pupils were equal round reactive to light. Extraocular movements were full, visual field were full on confrontational test. Facial sensation and strength were normal. Head turning and shoulder shrug  were normal and symmetric. Motor: The motor testing reveals 5 over 5 strength of all 4 extremities. Good symmetric motor tone is  noted throughout.  Sensory: Sensory testing is intact to soft touch on all 4 extremities. No evidence of extinction is noted.  Gait and station: Gait is slightly wide. Uses cane for stability. Tandem not attempted, today.     DIAGNOSTIC DATA (LABS, IMAGING, TESTING) - I reviewed patient records, labs, notes, testing and imaging myself where available.  Lab Results  Component Value Date   WBC 7.9 03/12/2021   HGB 13.3 03/12/2021   HCT 39.7 03/12/2021   MCV 94 03/12/2021   PLT 263 03/12/2021      Component Value Date/Time   NA 133 (L) 03/12/2021 1623   K 4.5 03/12/2021 1623   CL 94 (L) 03/12/2021 1623   CO2 24 03/12/2021 1623   GLUCOSE 176 (H) 03/12/2021 1623   GLUCOSE 159 (H) 02/20/2020 0013   BUN 14 03/12/2021 1623   CREATININE 0.56 (L) 03/12/2021 1623   CREATININE 0.65 03/28/2014 1208   CALCIUM 9.2 03/12/2021 1623   PROT 6.8 03/12/2021 1623   ALBUMIN 4.6 03/12/2021 1623   AST 15 03/12/2021 1623   ALT 16 03/12/2021 1623   ALKPHOS 72 03/12/2021 1623   BILITOT 0.4 03/12/2021 1623   GFRNONAA >60 02/20/2020 0013   GFRAA >60 02/20/2020 0013   No results found for: "CHOL", "HDL", "LDLCALC", "LDLDIRECT", "TRIG", "CHOLHDL" Lab Results  Component Value Date   HGBA1C 8.5 (H) 09/07/2014   Lab Results  Component Value Date   VITAMINB12 1,352 (H) 09/07/2014   Lab Results  Component Value Date   TSH 1.750 03/12/2021     ASSESSMENT AND PLAN  86 y.o. year old male  has a past medical history of Asthma, Bronchitis, COPD (chronic obstructive pulmonary disease) ([place]), Degenerative cervical disc, Diabetes mellitus, Diabetic peripheral neuropathy ([place]) (02/25/2018), Hematoma, Hypercholesterolemia, Hypertension, Iliac artery aneurysm ([place]), Lung nodule, Macular degeneration, Peripheral neuropathy, Progressive gait disorder, Progressive  gait disorder (09/15/2013), Skull fracture ([place]), and Subdural hematoma ([place]). here with    No diagnosis found.  [name] is doing well. He reports  that neuropathy waxes and wanes but, overall, is well managed. He was prescribed gabapentin but has been hesitant to use this. I have educated him on possible dosing options as well as possible side effects. I resubmitted gabapentin '100mg'$  QHS. I feel that pressure pain he described being worse after sitting for prolonged periods and in the mornings is most consistent with plantar fasciitis or an inflammatory process. I have advised he try Voltaren topically. He may consider visit with podiatry to assess as well. Healthy lifestyle habits encouraged. He will continue close follow up with care team. He will follow up with me as scheduled 08/2022. He and his wife verbalize understanding and agreement with this plan.    Debbora Presto, MSN, FNP-C 05/15/2022, 12:40 PM  [facility] [address] [place], [place] [address] [phone]

## 2022-05-15 NOTE — Patient Instructions (Incomplete)

## 2022-05-20 ENCOUNTER — Ambulatory Visit: Payer: PPO | Admitting: Family Medicine

## 2022-05-20 DIAGNOSIS — M0579 Rheumatoid arthritis with rheumatoid factor of multiple sites without organ or systems involvement: Secondary | ICD-10-CM

## 2022-05-20 DIAGNOSIS — E889 Metabolic disorder, unspecified: Secondary | ICD-10-CM

## 2022-05-31 DIAGNOSIS — E871 Hypo-osmolality and hyponatremia: Secondary | ICD-10-CM | POA: Diagnosis not present

## 2022-05-31 DIAGNOSIS — E559 Vitamin D deficiency, unspecified: Secondary | ICD-10-CM | POA: Diagnosis not present

## 2022-05-31 DIAGNOSIS — R5383 Other fatigue: Secondary | ICD-10-CM | POA: Diagnosis not present

## 2022-08-13 DIAGNOSIS — I10 Essential (primary) hypertension: Secondary | ICD-10-CM | POA: Diagnosis not present

## 2022-08-13 DIAGNOSIS — G4762 Sleep related leg cramps: Secondary | ICD-10-CM | POA: Diagnosis not present

## 2022-08-13 DIAGNOSIS — E871 Hypo-osmolality and hyponatremia: Secondary | ICD-10-CM | POA: Diagnosis not present

## 2022-08-13 DIAGNOSIS — R197 Diarrhea, unspecified: Secondary | ICD-10-CM | POA: Diagnosis not present

## 2022-08-13 DIAGNOSIS — G629 Polyneuropathy, unspecified: Secondary | ICD-10-CM | POA: Diagnosis not present

## 2022-08-13 DIAGNOSIS — R5383 Other fatigue: Secondary | ICD-10-CM | POA: Diagnosis not present

## 2022-08-14 NOTE — Patient Instructions (Signed)
Below is our plan:  We will continue to monitor. Discuss Volteran with Dr Kateri Plummer. I think it would be fairly safe to use as needed. Consider PT. Use your cane at all times. Consider massage. Consider Qutenza treatment if neuropathy pain worsens.   Please make sure you are staying well hydrated. I recommend 50-60 ounces daily. Well balanced diet and regular exercise encouraged. Consistent sleep schedule with 6-8 hours recommended.   Please continue follow up with care team as directed.   Follow up with Dr Vickey Huger in 6 months   You may receive a survey regarding today's visit. I encourage you to leave honest feed back as I do use this information to improve patient care. Thank you for seeing me today!

## 2022-08-14 NOTE — Progress Notes (Unsigned)
No chief complaint on file.   HISTORY OF PRESENT ILLNESS:  08/14/22 ALL: Alvy returns for follow up for peripheral neuropathy. He was last seen 01/2022 and encouraged to try gabapentin '100mg'$  QHS PRN and volteran cream PRN.   02/04/2022 ALL: Mr Cordrey returns for worsening peripheral neuropathy. He was last seen by Dr Brett Fairy 08/2021 and had concerns of feeling pebbles under feet with tingling and numbness. Per note, likely cause was RA. Rheumatology panel negative 02/2021. He continues methotrexate. He tells me, today, that he has had trouble with pain in the arch of both feet in the mornings when waking. He has to be careful with walking in the mornings but it gets better if he moves around. He reports a pressure sensation. No burning. Tingling is mild and not worsening. He can feel his feet normally. He is followed by podiatry for plantar fasciitis. He was given gabapentin '100mg'$  TID but only took it once about 2 years ago.   09/11/2021 CD: Demetries Coia is a 87 y.o. male patient with neuropathy and gait disorder. He is still bothered by his neuropathy , the feeling of waking on pebbles, tingling, and yet numb. His feet feel heavy.   He continues to use a stationary bike.  His beloved walks around [place] have been impossible, he feels too heavy.  Dr Jannifer Franklin did examine him by NCV and EMG. Axonal neuropathy was diagnosed. His rheumatology panel was normal, he has carried a dx of RA- Dr Charlynne Cousins. He  has plantar fascitis.  He also had Covid in November around thanksgiving, felt more tired, no critically illness resulted.   05/24/2020 ALL: Machi Whittaker is a 87 y.o. male here today for follow up for axonal peripheral neuropathy. He was prescribed gabapentin '100mg'$  TID by PCP. He took 1 dose and did not feel much benefit so he did not continue medication. He reports that neuropathy is fairly stable. He does have waxing and waning symptoms. He denies pain but described an uncomfortable  numbness sensation. He does feel a little off balance from time to time. No falls. He uses a cane. He does not feel PT would be beneficial at this time. He continues to follow up with his care team regularly. A1C was 7.1. he is working to control BP. He is exercising daily on his stationary bike. He tries to stay active.    04/20/2019 ALL: Braylynn Ghan is a 87 y.o. male here today for follow up for neuropathy.  He feels that he is doing quite well.   He is present today with his wife who agrees.  He does continue to have some heaviness in his feet.  He is able to exercise daily.  He walks and rides a stationary bike.  He continues close follow-up with rheumatology on methotrexate and prednisone.  He is not interested in taking medications to help with neuropathy at this time.  He feels that overall he is doing well and without concerns today.    REVIEW OF SYSTEMS: Out of a complete 14 system review of symptoms, the patient complains only of the following symptoms, neuropathy, foot pain,  and all other reviewed systems are negative.   ALLERGIES: Allergies  Allergen Reactions   Lisinopril Cough   Other Other (See Comments)   Pneumococcal Vac Polyvalent Other (See Comments)    Fever, sweats   Prednisone Other (See Comments)    Sweating with high dose   Prevnar 13 [Pneumococcal 13-Val Conj Vacc] Swelling   Penicillins Rash  Sulfa Antibiotics Rash     HOME MEDICATIONS: Outpatient Medications Prior to Visit  Medication Sig Dispense Refill   albuterol (PROVENTIL HFA;VENTOLIN HFA) 108 (90 BASE) MCG/ACT inhaler Inhale 2 puffs into the lungs every 6 (six) hours as needed for wheezing or shortness of breath.     Ascorbic Acid (VITAMIN C PO) Take 1 tablet by mouth daily.     aspirin 81 MG tablet Take 81 mg by mouth daily.     cetirizine (ZYRTEC) 10 MG tablet Take 10 mg by mouth daily.     famotidine (PEPCID) 20 MG tablet One at bedtime (Patient taking differently: Take 20 mg by mouth at  bedtime.)     finasteride (PROSCAR) 5 MG tablet Take 5 mg by mouth daily.     folic acid (FOLVITE) 1 MG tablet Take 1 mg by mouth daily.     gabapentin (NEURONTIN) 100 MG capsule Take 1 capsule (100 mg total) by mouth at bedtime. 90 capsule 3   glipiZIDE (GLUCOTROL XL) 10 MG 24 hr tablet Take 10 mg by mouth daily.     losartan (COZAAR) 100 MG tablet Take 100 mg by mouth daily.     metFORMIN (GLUCOPHAGE) 500 MG tablet Take 1 tablet (500 mg total) by mouth 2 (two) times daily with a meal. Resume tonight.     methotrexate 2.5 MG tablet Take 2.5 mg by mouth once a week. Take 6 tablets orally once a week     Multiple Vitamin (MULTIVITAMIN WITH MINERALS) TABS Take 1 tablet by mouth daily.     omeprazole (PRILOSEC) 40 MG capsule Take 40 mg by mouth daily.     ONE TOUCH ULTRA TEST test strip      ONETOUCH DELICA LANCETS 54Y MISC      SYMBICORT 160-4.5 MCG/ACT inhaler INHALE 2 PUFFS INTO THE LUNGS 2 TIMES DAILY (Patient taking differently: Inhale 2 puffs into the lungs 2 (two) times daily.) 1 Inhaler 11   Tamsulosin HCl (FLOMAX) 0.4 MG CAPS Take 0.4 mg by mouth daily.     No facility-administered medications prior to visit.     PAST MEDICAL HISTORY: Past Medical History:  Diagnosis Date   Asthma    Bronchitis    COPD (chronic obstructive pulmonary disease) ([place])    Degenerative cervical disc    Diabetes mellitus    Diabetic peripheral neuropathy ([place]) 02/25/2018   Hematoma    left subdoral   Hypercholesterolemia    Hypertension    Iliac artery aneurysm ([facility])    Lung nodule    Macular degeneration    Peripheral neuropathy    Progressive gait disorder    Progressive gait disorder 09/15/2013   Skull fracture ([facility])    Subdural hematoma (H[facility])      PAST SURGICAL HISTORY: No past surgical history on file.   FAMILY HISTORY: Family History  Problem Relation Age of Onset   Stroke Father      SOCIAL HISTORY: Social History   Socioeconomic History   Marital status: Married     Spouse name: [name]   Number of children: 3   Years of education: 13   Highest education level: Not on file  Occupational History   Occupation: Retired  Tobacco Use   Smoking status: Former    Packs/day: 0.50    Years: 60.00    Total pack years: 30.00    Types: Cigarettes    Quit date: 04/07/2014    Years since quitting: 8.3   Smokeless tobacco: Never  Substance and Sexual  Activity   Alcohol use: Yes    Alcohol/week: 14.0 standard drinks of alcohol    Types: 14 Standard drinks or equivalent per week    Comment: 1-2 before dinner   Drug use: No   Sexual activity: Not on file  Other Topics Concern   Not on file  Social History Narrative   Patient is married [name]).   Patient has three children and 7 grandchildren.   Patient is retired.   Patient drinks one serving of coffee daily.   Patient is right-handed.   Patient has a college education.   Social Determinants of Health   Financial Resource Strain: Not on file  Food Insecurity: Not on file  Transportation Needs: Not on file  Physical Activity: Not on file  Stress: Not on file  Social Connections: Not on file  Intimate Partner Violence: Not on file      PHYSICAL EXAM  There were no vitals filed for this visit.   There is no height or weight on file to calculate BMI.   Generalized: Well developed, in no acute distress   Cardiology: normal rate and rhythm Respiratory: clear to auscultation bilaterally   Neurological examination  Mentation: Alert oriented to time, place, history taking. Follows all commands speech and language fluent Cranial nerve II-XII: Pupils were equal round reactive to light. Extraocular movements were full, visual field were full on confrontational test. Facial sensation and strength were normal. Head turning and shoulder shrug  were normal and symmetric. Motor: The motor testing reveals 5 over 5 strength of all 4 extremities. Good symmetric motor tone is noted throughout.  Sensory:  Sensory testing is intact to soft touch on all 4 extremities. No evidence of extinction is noted.  Gait and station: Gait is slightly wide. Uses cane for stability. Tandem not attempted, today.     DIAGNOSTIC DATA (LABS, IMAGING, TESTING) - I reviewed patient records, labs, notes, testing and imaging myself where available.  Lab Results  Component Value Date   WBC 7.9 03/12/2021   HGB 13.3 03/12/2021   HCT 39.7 03/12/2021   MCV 94 03/12/2021   PLT 263 03/12/2021      Component Value Date/Time   NA 133 (L) 03/12/2021 1623   K 4.5 03/12/2021 1623   CL 94 (L) 03/12/2021 1623   CO2 24 03/12/2021 1623   GLUCOSE 176 (H) 03/12/2021 1623   GLUCOSE 159 (H) 02/20/2020 0013   BUN 14 03/12/2021 1623   CREATININE 0.56 (L) 03/12/2021 1623   CREATININE 0.65 03/28/2014 1208   CALCIUM 9.2 03/12/2021 1623   PROT 6.8 03/12/2021 1623   ALBUMIN 4.6 03/12/2021 1623   AST 15 03/12/2021 1623   ALT 16 03/12/2021 1623   ALKPHOS 72 03/12/2021 1623   BILITOT 0.4 03/12/2021 1623   GFRNONAA >60 02/20/2020 0013   GFRAA >60 02/20/2020 0013   No results found for: "CHOL", "HDL", "LDLCALC", "LDLDIRECT", "TRIG", "CHOLHDL" Lab Results  Component Value Date   HGBA1C 8.5 (H) 09/07/2014   Lab Results  Component Value Date   VITAMINB12 1,352 (H) 09/07/2014   Lab Results  Component Value Date   TSH 1.750 03/12/2021      ASSESSMENT AND PLAN  87 y.o. year old male  has a past medical history of Asthma, Bronchitis, COPD (chronic obstructive pulmonary disease) ([place]), Degenerative cervical disc, Diabetes mellitus, Diabetic peripheral neuropathy ([place]) (02/25/2018), Hematoma, Hypercholesterolemia, Hypertension, Iliac artery aneurysm ([place]), Lung nodule, Macular degeneration, Peripheral neuropathy, Progressive gait disorder, Progressive gait disorder (09/15/2013), Skull fracture ([place]),  and Subdural hematoma ([place]). here with   No diagnosis found.  Euell is doing well. He reports that neuropathy waxes and wanes  but, overall, is well managed. He was prescribed gabapentin but has been hesitant to use this. I have educated him on possible dosing options as well as possible side effects. I resubmitted gabapentin '100mg'$  QHS. I feel that pressure pain he described being worse after sitting for prolonged periods and in the mornings is most consistent with plantar fasciitis or an inflammatory process. I have advised he try Voltaren topically. He may consider visit with podiatry to assess as well. Healthy lifestyle habits encouraged. He will continue close follow up with care team. He will follow up with me as scheduled 08/2022. He and his wife verbalize understanding and agreement with this plan.    Debbora Presto, MSN, FNP-C 08/14/2022, 2:37 PM  [facility] [address] [place], [place] [address] [phone]

## 2022-08-15 ENCOUNTER — Ambulatory Visit (INDEPENDENT_AMBULATORY_CARE_PROVIDER_SITE_OTHER): Payer: PPO | Admitting: Family Medicine

## 2022-08-15 ENCOUNTER — Encounter: Payer: Self-pay | Admitting: Family Medicine

## 2022-08-15 VITALS — BP 152/83 | HR 93 | Ht 66.0 in | Wt 208.5 lb

## 2022-08-15 DIAGNOSIS — M0579 Rheumatoid arthritis with rheumatoid factor of multiple sites without organ or systems involvement: Secondary | ICD-10-CM

## 2022-08-15 DIAGNOSIS — E889 Metabolic disorder, unspecified: Secondary | ICD-10-CM

## 2022-08-15 DIAGNOSIS — G6289 Other specified polyneuropathies: Secondary | ICD-10-CM

## 2022-08-15 DIAGNOSIS — G63 Polyneuropathy in diseases classified elsewhere: Secondary | ICD-10-CM | POA: Diagnosis not present

## 2022-08-16 DIAGNOSIS — I10 Essential (primary) hypertension: Secondary | ICD-10-CM | POA: Diagnosis not present

## 2022-08-16 DIAGNOSIS — R5383 Other fatigue: Secondary | ICD-10-CM | POA: Diagnosis not present

## 2022-08-16 DIAGNOSIS — E871 Hypo-osmolality and hyponatremia: Secondary | ICD-10-CM | POA: Diagnosis not present

## 2022-08-21 DIAGNOSIS — Z79899 Other long term (current) drug therapy: Secondary | ICD-10-CM | POA: Diagnosis not present

## 2022-08-21 DIAGNOSIS — M79641 Pain in right hand: Secondary | ICD-10-CM | POA: Diagnosis not present

## 2022-08-21 DIAGNOSIS — M069 Rheumatoid arthritis, unspecified: Secondary | ICD-10-CM | POA: Diagnosis not present

## 2022-08-21 DIAGNOSIS — M25559 Pain in unspecified hip: Secondary | ICD-10-CM | POA: Diagnosis not present

## 2022-08-21 DIAGNOSIS — M1990 Unspecified osteoarthritis, unspecified site: Secondary | ICD-10-CM | POA: Diagnosis not present

## 2022-08-21 DIAGNOSIS — J449 Chronic obstructive pulmonary disease, unspecified: Secondary | ICD-10-CM | POA: Diagnosis not present

## 2022-08-21 DIAGNOSIS — G629 Polyneuropathy, unspecified: Secondary | ICD-10-CM | POA: Diagnosis not present

## 2022-08-21 DIAGNOSIS — E119 Type 2 diabetes mellitus without complications: Secondary | ICD-10-CM | POA: Diagnosis not present

## 2022-08-21 DIAGNOSIS — M19049 Primary osteoarthritis, unspecified hand: Secondary | ICD-10-CM | POA: Diagnosis not present

## 2022-08-27 DIAGNOSIS — E113293 Type 2 diabetes mellitus with mild nonproliferative diabetic retinopathy without macular edema, bilateral: Secondary | ICD-10-CM | POA: Diagnosis not present

## 2022-08-27 DIAGNOSIS — Z961 Presence of intraocular lens: Secondary | ICD-10-CM | POA: Diagnosis not present

## 2022-08-27 DIAGNOSIS — H52203 Unspecified astigmatism, bilateral: Secondary | ICD-10-CM | POA: Diagnosis not present

## 2022-08-27 DIAGNOSIS — H353132 Nonexudative age-related macular degeneration, bilateral, intermediate dry stage: Secondary | ICD-10-CM | POA: Diagnosis not present

## 2022-08-28 DIAGNOSIS — E871 Hypo-osmolality and hyponatremia: Secondary | ICD-10-CM | POA: Diagnosis not present

## 2022-09-11 ENCOUNTER — Ambulatory Visit: Payer: PPO | Admitting: Family Medicine

## 2022-09-22 IMAGING — CT CT CTA ABD/PEL W/CM AND/OR W/O CM
1 of 4 series · 12 of 32 positions shown, 17 images · IV contrast (APPLIED)
Comparison: 04/16/2018 and 04/19/2016

CLINICAL DATA: Evaluate celiac artery aneurysm.

EXAM:
CTA ABDOMEN AND PELVIS WITHOUT AND WITH CONTRAST
TECHNIQUE: Multidetector CT imaging of the abdomen and pelvis was performed
using the standard protocol during bolus administration of
intravenous contrast. Multiplanar reconstructed images and MIPs were
obtained and reviewed to evaluate the vascular anatomy.
CONTRAST:  75mL [id number] IOPAMIDOL ([id number]) INJECTION 76%

[Series 5: pre stent angio · axial · non-contrast · 0.89mm/px · z∈[-470,-104]mm · 12 of 416 slices shown, 17 images]
[im 25/416  soft-tissue]
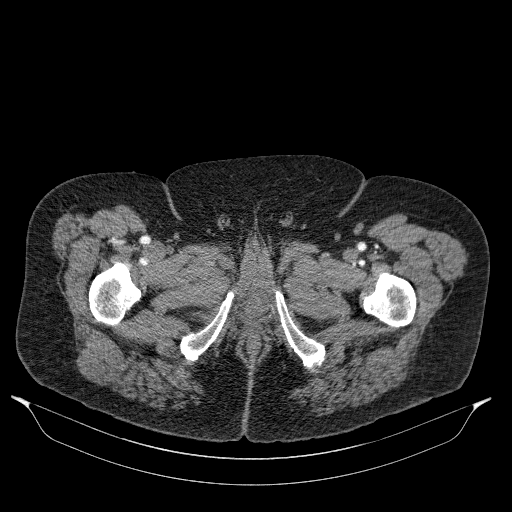
[im 25/416  bone]
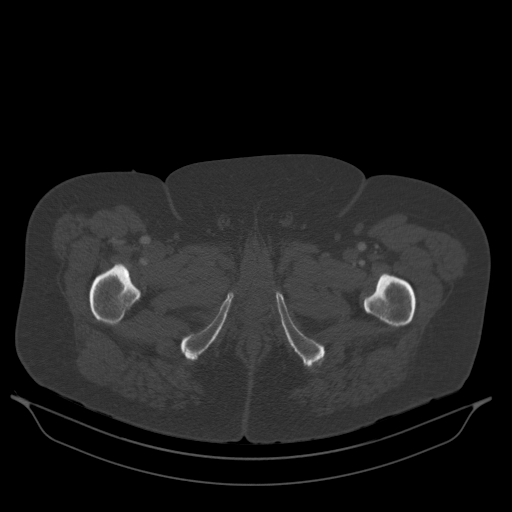
[im 74/416  soft-tissue]
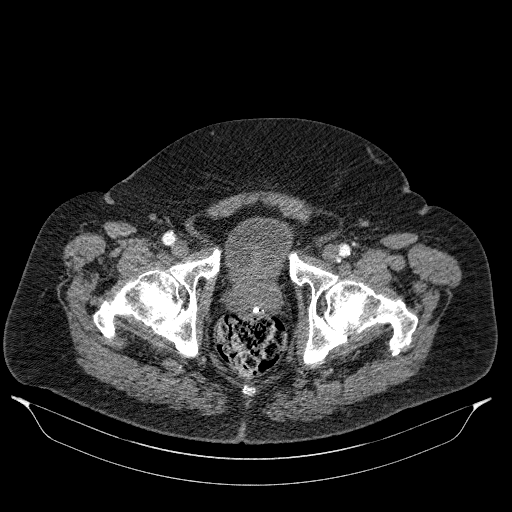
[im 98/416  soft-tissue]
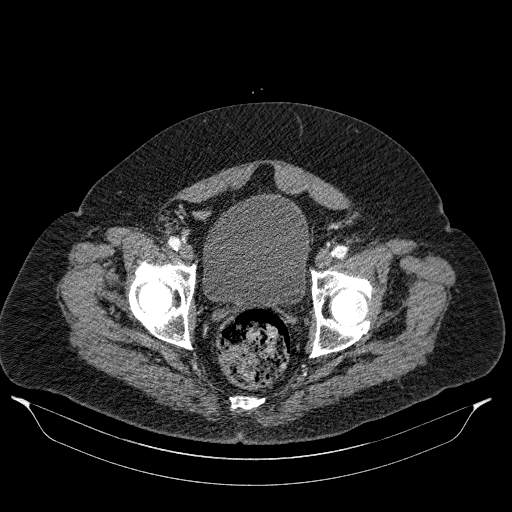
[im 147/416  soft-tissue]
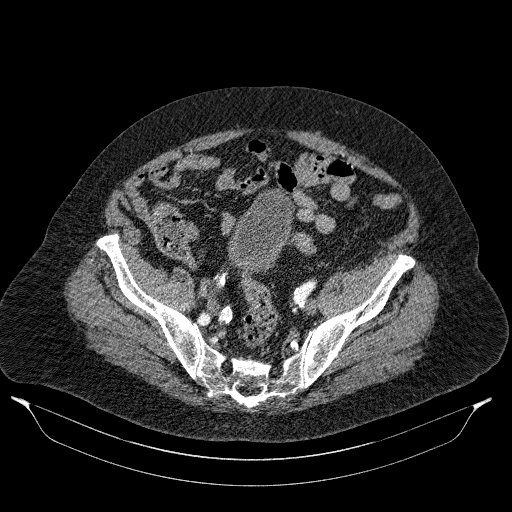
[im 171/416  soft-tissue]
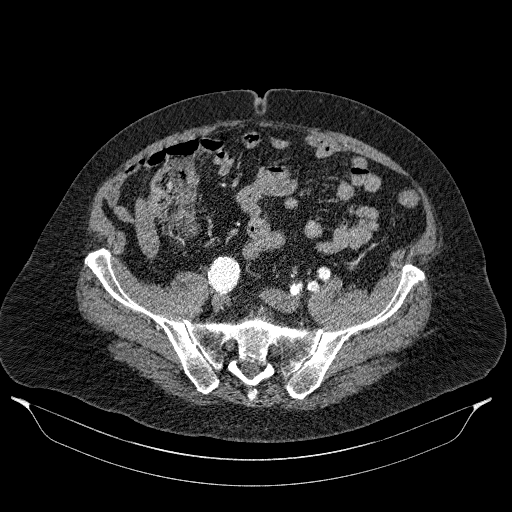
[im 220/416  soft-tissue]
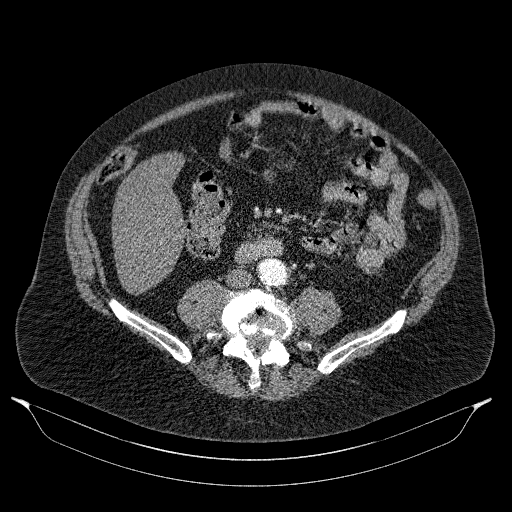
[im 245/416  soft-tissue]
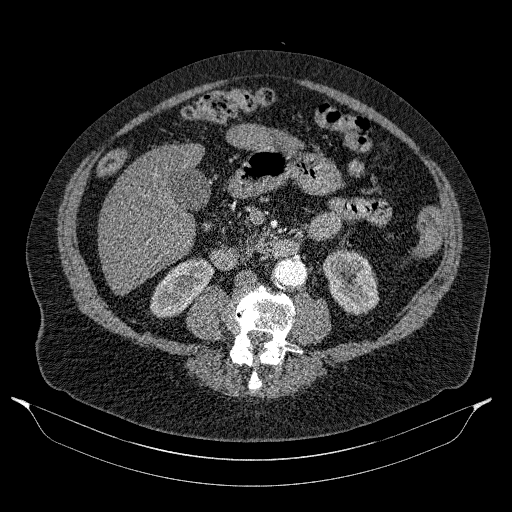
[im 269/416  soft-tissue]
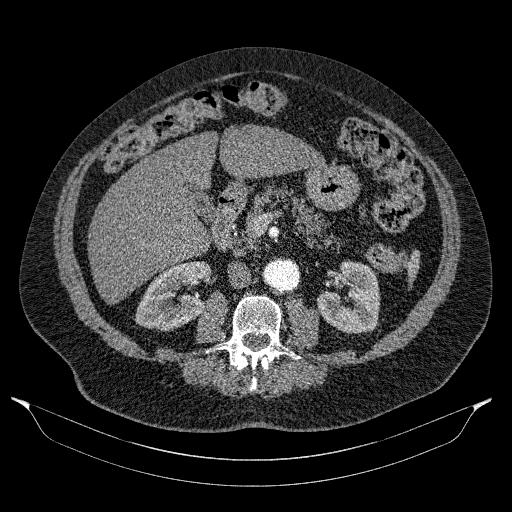
[im 318/416  soft-tissue]
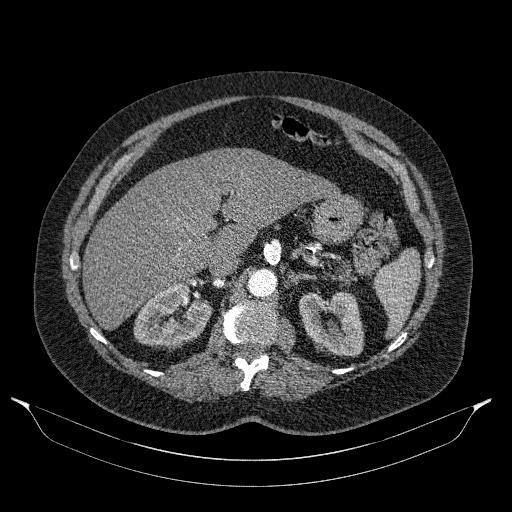
[im 318/416  lung]
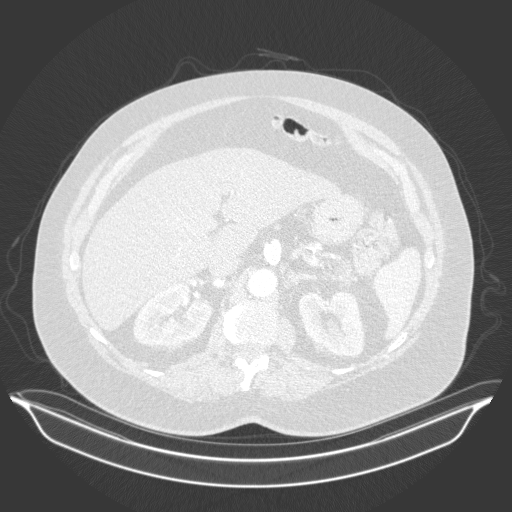
[im 318/416  bone]
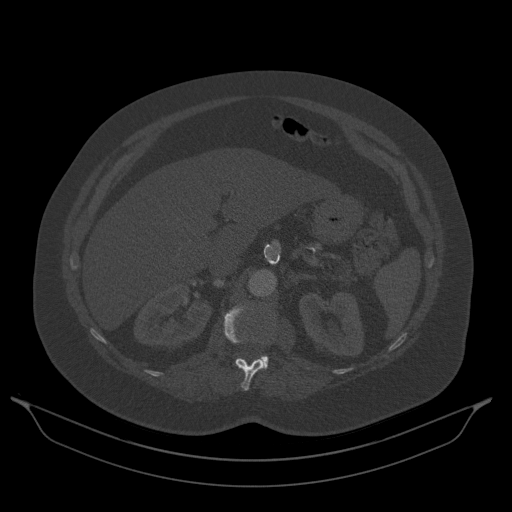
[im 342/416  soft-tissue]
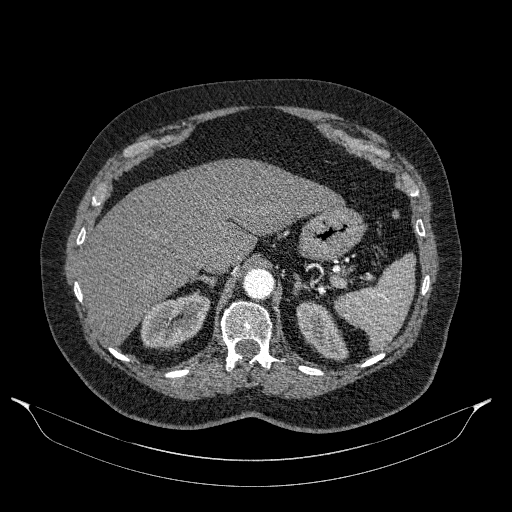
[im 342/416  lung]
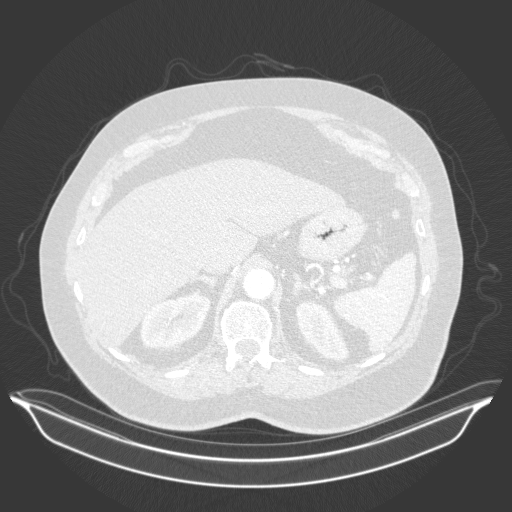
[im 367/416  lung]
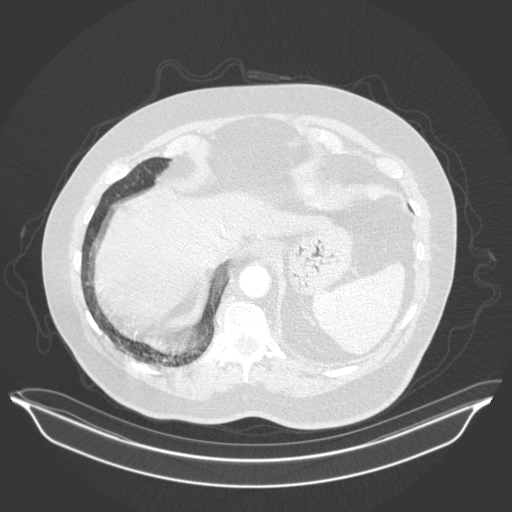
[im 391/416  soft-tissue]
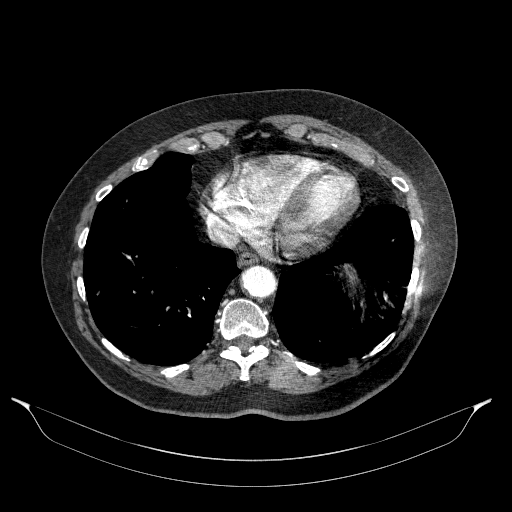
[im 391/416  lung]
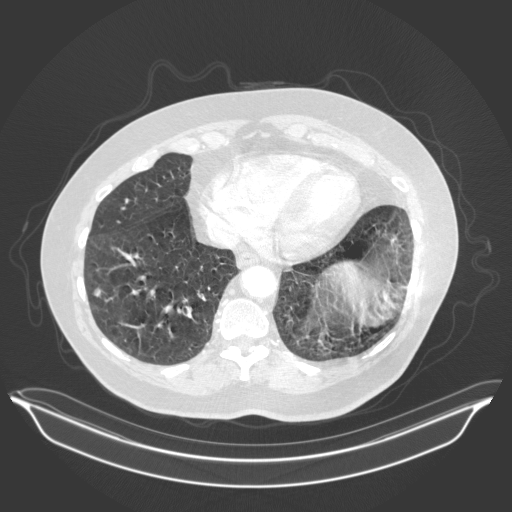

[12 of 32 positions shown; findings below may reference images not displayed]

FINDINGS: VASCULAR

Aorta: Atherosclerotic disease in the abdominal aorta without
dissection. Distal abdominal aorta measures up to 3.0 cm and stable.
Abdominal aorta is tortuous. New ulcerative plaque along the
posterior wall of the distal abdominal aorta on sequence 4, image
90.

Celiac: Again noted is a peripherally calcified saccular aneurysm
involving the distal celiac artery trunk. This aneurysm measures up
to 1.7 cm in the AP dimension and this is unchanged dating back to
04/19/2016. Left gastric artery, common hepatic artery and splenic
artery are patent.

SMA: Patent without evidence of aneurysm, dissection, vasculitis or
significant stenosis.

Renals: Both renal arteries are patent without evidence of aneurysm,
dissection, vasculitis, fibromuscular dysplasia or significant
stenosis.

IMA: Patent

Inflow: Distal right common iliac artery measures up to 2.7 cm and
stable. Proximal right internal iliac artery is heavily calcified
but patent. Right external iliac artery is patent. Left common iliac
artery is tortuous. Distal left common iliac artery measures 2.5 cm
and stable. Atherosclerotic calcifications involving the left
internal iliac artery. Left external iliac artery is very tortuous
but patent.

Proximal Outflow: Proximal femoral arteries are patent bilaterally.

Veins: Main portal venous system is patent. IVC and renal veins are
patent.

Review of the MIP images confirms the above findings.

NON-VASCULAR

Lower chest: Significant motion artifact at the lung bases and
better seen on the delayed images. Again noted is a peripheral
nodule in the right lower lobe that measures roughly 6 mm and
minimally changed since 4875. No large pleural effusions.

Hepatobiliary: Again noted is low-density in the liver and findings
are likely related to hepatic steatosis. Round structure within the
gallbladder is suggestive for a stone.

Pancreas: Unremarkable. No pancreatic ductal dilatation or
surrounding inflammatory changes.

Spleen: Normal in size without focal abnormality.

Adrenals/Urinary Tract: Normal appearance of the adrenal glands.
Normal appearance of both kidneys without hydronephrosis. No
suspicious renal lesions. Normal appearance of the urinary bladder.

Stomach/Bowel: Large amount of stool in the rectum. Normal
appearance of the stomach. No evidence for bowel inflammation or
obstruction.

Lymphatic: No abdominal or pelvic lymphadenopathy.

Reproductive: Stable appearance of the prostate with calcifications.

Other: Small left inguinal hernia containing fat. Small
periumbilical hernia containing fat. Negative for free fluid.

Musculoskeletal: Mild retrolisthesis at L3-L4. Disc space narrowing
at L3-L4, L4-L5 and L5-S1.
IMPRESSION: VASCULAR

1. Stable saccular aneurysm involving the celiac trunk measuring up
to 1.7 cm.
2. Stable aneurysm of the right distal common iliac artery measuring
up to 2.7 cm.
3. Stable aneurysm of the left common iliac artery measuring up to
2.5 cm.
4. Stable size of the abdominal aorta aortic aneurysm measuring 3 cm
with atherosclerotic disease. New ulcerative plaque along the
posterior aspect of the abdominal aorta as described.

NON-VASCULAR

1. Stable 6 mm nodule in the right lower lobe.
2. Low-density of the liver is suggestive for hepatic steatosis.
3. Cholelithiasis.

## 2022-09-23 DIAGNOSIS — R6883 Chills (without fever): Secondary | ICD-10-CM | POA: Diagnosis not present

## 2022-09-23 DIAGNOSIS — Z03818 Encounter for observation for suspected exposure to other biological agents ruled out: Secondary | ICD-10-CM | POA: Diagnosis not present

## 2022-09-23 DIAGNOSIS — J209 Acute bronchitis, unspecified: Secondary | ICD-10-CM | POA: Diagnosis not present

## 2022-09-23 DIAGNOSIS — R5383 Other fatigue: Secondary | ICD-10-CM | POA: Diagnosis not present

## 2022-09-23 DIAGNOSIS — R52 Pain, unspecified: Secondary | ICD-10-CM | POA: Diagnosis not present

## 2022-09-23 DIAGNOSIS — Z8709 Personal history of other diseases of the respiratory system: Secondary | ICD-10-CM | POA: Diagnosis not present

## 2022-09-23 DIAGNOSIS — R062 Wheezing: Secondary | ICD-10-CM | POA: Diagnosis not present

## 2022-09-26 DIAGNOSIS — R9389 Abnormal findings on diagnostic imaging of other specified body structures: Secondary | ICD-10-CM | POA: Diagnosis not present

## 2022-09-26 DIAGNOSIS — E222 Syndrome of inappropriate secretion of antidiuretic hormone: Secondary | ICD-10-CM | POA: Diagnosis not present

## 2022-09-26 DIAGNOSIS — E871 Hypo-osmolality and hyponatremia: Secondary | ICD-10-CM | POA: Diagnosis not present

## 2022-09-26 DIAGNOSIS — R0602 Shortness of breath: Secondary | ICD-10-CM | POA: Diagnosis not present

## 2022-09-26 DIAGNOSIS — J309 Allergic rhinitis, unspecified: Secondary | ICD-10-CM | POA: Diagnosis not present

## 2022-09-28 ENCOUNTER — Emergency Department (HOSPITAL_BASED_OUTPATIENT_CLINIC_OR_DEPARTMENT_OTHER)
Admission: EM | Admit: 2022-09-28 | Discharge: 2022-09-28 | Disposition: A | Discharge status: Home / Self Care | Payer: PPO | Attending: Emergency Medicine | Admitting: Emergency Medicine

## 2022-09-28 ENCOUNTER — Emergency Department (HOSPITAL_BASED_OUTPATIENT_CLINIC_OR_DEPARTMENT_OTHER): Payer: PPO | Admitting: Radiology

## 2022-09-28 ENCOUNTER — Other Ambulatory Visit: Payer: Self-pay

## 2022-09-28 DIAGNOSIS — Z79899 Other long term (current) drug therapy: Secondary | ICD-10-CM | POA: Insufficient documentation

## 2022-09-28 DIAGNOSIS — Z7984 Long term (current) use of oral hypoglycemic drugs: Secondary | ICD-10-CM | POA: Insufficient documentation

## 2022-09-28 DIAGNOSIS — I10 Essential (primary) hypertension: Secondary | ICD-10-CM | POA: Insufficient documentation

## 2022-09-28 DIAGNOSIS — R0602 Shortness of breath: Secondary | ICD-10-CM | POA: Diagnosis not present

## 2022-09-28 DIAGNOSIS — Z1152 Encounter for screening for COVID-19: Secondary | ICD-10-CM | POA: Insufficient documentation

## 2022-09-28 DIAGNOSIS — E871 Hypo-osmolality and hyponatremia: Secondary | ICD-10-CM | POA: Insufficient documentation

## 2022-09-28 DIAGNOSIS — J181 Lobar pneumonia, unspecified organism: Secondary | ICD-10-CM | POA: Diagnosis not present

## 2022-09-28 DIAGNOSIS — J449 Chronic obstructive pulmonary disease, unspecified: Secondary | ICD-10-CM | POA: Insufficient documentation

## 2022-09-28 DIAGNOSIS — Z7982 Long term (current) use of aspirin: Secondary | ICD-10-CM | POA: Insufficient documentation

## 2022-09-28 DIAGNOSIS — E1165 Type 2 diabetes mellitus with hyperglycemia: Secondary | ICD-10-CM | POA: Insufficient documentation

## 2022-09-28 DIAGNOSIS — J069 Acute upper respiratory infection, unspecified: Secondary | ICD-10-CM | POA: Diagnosis not present

## 2022-09-28 DIAGNOSIS — J189 Pneumonia, unspecified organism: Secondary | ICD-10-CM

## 2022-09-28 DIAGNOSIS — J9811 Atelectasis: Secondary | ICD-10-CM | POA: Diagnosis not present

## 2022-09-28 DIAGNOSIS — J168 Pneumonia due to other specified infectious organisms: Secondary | ICD-10-CM | POA: Diagnosis not present

## 2022-09-28 LAB — BASIC METABOLIC PANEL
Anion gap: 11 (ref 5–15)
BUN: 11 mg/dL (ref 8–23)
CO2: 25 mmol/L (ref 22–32)
Calcium: 9.9 mg/dL (ref 8.9–10.3)
Chloride: 90 mmol/L — ABNORMAL LOW (ref 98–111)
Creatinine, Ser: 0.55 mg/dL — ABNORMAL LOW (ref 0.61–1.24)
GFR, Estimated: >60 mL/min (ref >60)
Glucose, Bld: 260 mg/dL — ABNORMAL HIGH (ref 70–99)
Potassium: 4.1 mmol/L (ref 3.5–5.1)
Sodium: 126 mmol/L — ABNORMAL LOW (ref 135–145)

## 2022-09-28 LAB — RESP PANEL BY RT-PCR (RSV, FLU A&B, COVID)  RVPGX2
Influenza A by PCR: NEGATIVE
Influenza B by PCR: NEGATIVE
Resp Syncytial Virus by PCR: NEGATIVE
SARS Coronavirus 2 by RT PCR: NEGATIVE

## 2022-09-28 LAB — CBC
HCT: 37.4 % — ABNORMAL LOW (ref 39.0–52.0)
Hemoglobin: 13.3 g/dL (ref 13.0–17.0)
MCH: 33 pg (ref 26.0–34.0)
MCHC: 35.6 g/dL (ref 30.0–36.0)
MCV: 92.8 fL (ref 80.0–100.0)
Platelets: 313 10*3/uL (ref 150–400)
RBC: 4.03 MIL/uL — ABNORMAL LOW (ref 4.22–5.81)
RDW: 13.8 % (ref 11.5–15.5)
WBC: 7.7 10*3/uL (ref 4.0–10.5)
nRBC: 0 % (ref 0.0–0.2)

## 2022-09-28 MED ORDER — SODIUM CHLORIDE 0.9 % IV SOLN
1.0000 g | Freq: Once | INTRAVENOUS | Status: AC
  Administered 2022-09-28: 1 g via INTRAVENOUS
  Filled 2022-09-28: qty 10

## 2022-09-28 MED ORDER — SODIUM CHLORIDE 0.9 % IV SOLN: INTRAVENOUS | Status: DC | PRN

## 2022-09-28 MED ORDER — ALBUTEROL SULFATE (2.5 MG/3ML) 0.083% IN NEBU
2.5000 mg | INHALATION_SOLUTION | Freq: Once | RESPIRATORY_TRACT | Status: AC
  Administered 2022-09-28: 2.5 mg via RESPIRATORY_TRACT
  Filled 2022-09-28: qty 3

## 2022-09-28 MED ORDER — ALBUTEROL SULFATE HFA 108 (90 BASE) MCG/ACT IN AERS: 2.0000 | INHALATION_SPRAY | RESPIRATORY_TRACT | Status: DC | PRN

## 2022-09-28 MED ORDER — CEFPODOXIME PROXETIL 200 MG PO TABS: 200.0000 mg | ORAL_TABLET | Freq: Two times a day (BID) | ORAL | 0 refills | Status: AC

## 2022-09-28 NOTE — ED Triage Notes (Signed)
Pt arrived POV, ambulatory in triage, caox4. Pt c/o SOB with nasal congestion x1 week. Hx COPD, has used inhalers at home with minimal relief. Pt reports he was seen at PCP 2 days ago and dx with bronchitis, pt states he did not have CXR done. Lung sounds clear in upper, slightly diminished in lower, with faint rhonchi in R lower. Slightly tachypneic in triage, non-labored at rest.

## 2022-09-28 NOTE — ED Provider Notes (Signed)
[facility] Provider Note   CSN: [id number] Arrival date & time: 09/28/22  1126     History  Chief Complaint  Patient presents with   Shortness of Breath    [name] is a 87 y.o. male.  Patient is an 87 year old male with a past medical history of asthma and COPD, hypertension, SIADH and diabetes presenting to the emergency department with shortness of breath.  Patient states over the last few days he has had increasing cough and shortness of breath.  The patient states that he was seen by his primary doctor and was told that he had bronchitis and was started on a Z-Pak.  He states that he has 1 day left of the azithromycin but reports that he is has not had significant improvement of his symptoms and if anything he has been feeling more short of breath.  He states that he has needed his inhalers more often at home.  He states that he has not been started on steroids as he has had bad reactions to steroids in the past.  He denies any fevers or chills, nausea, vomiting or diarrhea.  Of note, he states that he is currently being evaluated by endocrinology for SIADH and that his baseline sodium is around 128.  The history is provided by the patient and a relative.  Shortness of Breath      Home Medications Prior to Admission medications   Medication Sig Start Date End Date Taking? Authorizing Provider  cefpodoxime (VANTIN) 200 MG tablet Take 1 tablet (200 mg total) by mouth 2 (two) times daily for 7 days. 09/28/22 10/05/22 Yes Maylon Peppers, Jordan Hawks K, DO  albuterol (PROVENTIL HFA;VENTOLIN HFA) 108 (90 BASE) MCG/ACT inhaler Inhale 2 puffs into the lungs every 6 (six) hours as needed for wheezing or shortness of breath.    [provider]  Ascorbic Acid (VITAMIN C PO) Take 1 tablet by mouth daily.    [provider]  aspirin 81 MG tablet Take 81 mg by mouth daily.    [provider]  cetirizine (ZYRTEC) 10 MG tablet  Take 10 mg by mouth daily.    [provider]  famotidine (PEPCID) 20 MG tablet One at bedtime Patient taking differently: Take 20 mg by mouth at bedtime. 04/08/16   Tanda Rockers, MD  finasteride (PROSCAR) 5 MG tablet Take 5 mg by mouth daily.    [provider]  folic acid (FOLVITE) 1 MG tablet Take 1 mg by mouth daily.    [provider]  gabapentin (NEURONTIN) 100 MG capsule Take 1 capsule (100 mg total) by mouth at bedtime. 02/04/22   Lomax, Amy, NP  glipiZIDE (GLUCOTROL XL) 10 MG 24 hr tablet Take 10 mg by mouth daily. 01/23/20   [provider]  losartan (COZAAR) 100 MG tablet Take 100 mg by mouth daily. 03/20/19   [provider]  metFORMIN (GLUCOPHAGE) 500 MG tablet Take 1 tablet (500 mg total) by mouth 2 (two) times daily with a meal. Resume tonight. 09/09/14   Bonnielee Haff, MD  methotrexate 2.5 MG tablet Take 2.5 mg by mouth once a week. Take 6 tablets orally once a week    [provider]  Multiple Vitamin (MULTIVITAMIN WITH MINERALS) TABS Take 1 tablet by mouth daily.    [provider]  omeprazole (PRILOSEC) 40 MG capsule Take 40 mg by mouth daily.    [provider]  ONE TOUCH ULTRA TEST test strip  09/28/15  [provider]  Jonetta Speak LANCETS [id number] [place]  09/28/15   [provider]  SYMBICORT 160-4.5 MCG/ACT inhaler INHALE 2 PUFFS INTO THE LUNGS 2 TIMES DAILY Patient taking differently: Inhale 2 puffs into the lungs 2 (two) times daily. 11/04/16   Tanda Rockers, MD  Tamsulosin HCl (FLOMAX) 0.4 MG CAPS Take 0.4 mg by mouth daily.    [provider]      Allergies    Lisinopril, Other, Pneumococcal vac polyvalent, Prednisone, Prevnar 13 [pneumococcal 13-val conj vacc], Penicillins, and Sulfa antibiotics    Review of Systems   Review of Systems  Respiratory:  Positive for shortness of breath.     Physical Exam Updated Vital Signs BP (!) 171/94   Pulse (!) 108   Temp 97.9 F  (36.6 C)   Resp (!) 22   SpO2 93%  Physical Exam Vitals and nursing note reviewed.  Constitutional:      General: He is not in acute distress.    Appearance: He is well-developed.  HENT:     Head: Normocephalic and atraumatic.     Mouth/Throat:     Mouth: Mucous membranes are moist.     Pharynx: Oropharynx is clear.  Eyes:     Extraocular Movements: Extraocular movements intact.  Cardiovascular:     Rate and Rhythm: Normal rate and regular rhythm.     Pulses: Normal pulses.     Heart sounds: Normal heart sounds.  Pulmonary:     Effort: Pulmonary effort is normal. No accessory muscle usage or respiratory distress.     Breath sounds: Wheezing (Mild diffuse end expiratory) present.  Abdominal:     Palpations: Abdomen is soft.     Tenderness: There is no abdominal tenderness.  Musculoskeletal:        General: Normal range of motion.     Cervical back: Normal range of motion and neck supple.     Right lower leg: No edema.     Left lower leg: No edema.  Skin:    General: Skin is warm and dry.  Neurological:     General: No focal deficit present.     Mental Status: He is alert and oriented to person, place, and time.  Psychiatric:        Mood and Affect: Mood normal.        Behavior: Behavior normal.     ED Results / Procedures / Treatments   Labs (all labs ordered are listed, but only abnormal results are displayed) Labs Reviewed  BASIC METABOLIC PANEL - Abnormal; Notable for the following components:      Result Value   Sodium 126 (*)    Chloride 90 (*)    Glucose, Bld 260 (*)    Creatinine, Ser 0.55 (*)    All other components within normal limits  CBC - Abnormal; Notable for the following components:   RBC 4.03 (*)    HCT 37.4 (*)    All other components within normal limits  RESP PANEL BY RT-PCR (RSV, FLU A&B, COVID)  [id number]    EKG None  Radiology DG Chest 2 View  Result Date: 09/28/2022 CLINICAL DATA:  Short of breath.  Upper respiratory infection.  EXAM: CHEST - 2 VIEW COMPARISON:  None Available. FINDINGS: Normal cardiac silhouette. Chronic atelectasis in lingula. Superimpose streakiness within the lingular lobe noted. RIGHT lung relatively clear. Lungs mildly hyperinflated. IMPRESSION: Concern for lingular pneumonia superimposed on chronic atelectasis. Electronically Signed   By: Helane Gunther.D.  On: 09/28/2022 12:34    Procedures Procedures    Medications Ordered in ED Medications  albuterol (VENTOLIN HFA) 108 (90 Base) MCG/ACT inhaler 2 puff (has no administration in time range)  0.9 %  sodium chloride infusion ( Intravenous New Bag/Given 09/28/22 1343)  albuterol (PROVENTIL) (2.5 MG/3ML) 0.083% nebulizer solution 2.5 mg (2.5 mg Nebulization Given 09/28/22 1202)  cefTRIAXone (ROCEPHIN) 1 g in sodium chloride 0.9 % 100 mL IVPB (0 g Intravenous Stopped 09/28/22 1444)    ED Course/ Medical Decision Making/ A&P                             Medical Decision Making This patient presents to the ED with chief complaint(s) of cough, SOB with pertinent past medical history of asthma/COPD, SIADH, HTN, DM which further complicates the presenting complaint. The complaint involves an extensive differential diagnosis and also carries with it a high risk of complications and morbidity.    The differential diagnosis includes COPD exacerbation, pneumonia, pneumothorax, pulmonary edema, pleural effusion, viral syndrome, no signs of sepsis on exam  Additional history obtained: Additional history obtained from family Records reviewed Primary Care Documents  ED Course and Reassessment: Patient was initially evaluated by triage and had labs and chest x-ray performed.  Labs showed hyponatremia consistent with his SIADH and patient reports that it is at his baseline sodium level.  He tested negative for COVID, flu and RSV.  Chest x-ray does show concern for pneumonia in the left lingula.  He has a penicillin allergy with a rash and will be treated  with cephalosporin and was recommended to also complete his azithromycin as prescribed.  He was given albuterol with improvement of his wheezing in the emergency department and is stable for discharge home with primary care follow-up.  He was given strict return precautions.  Independent labs interpretation:  The following labs were independently interpreted: Hyponatremia at baseline per patient, mild hyperglycemia  Independent visualization of imaging: - I independently visualized the following imaging with scope of interpretation limited to determining acute life threatening conditions related to emergency care: CXR, which revealed L lingula pneumonia  Consultation: - Consulted or discussed management/test interpretation w/ external professional: N/A  Consideration for admission or further workup: Patient has no emergent conditions requiring admission or further work-up at this time and is stable for discharge home with primary care follow-up  Social Determinants of health: N/A    Amount and/or Complexity of Data Reviewed Labs: ordered. Radiology: ordered.  Risk Prescription drug management.          Final Clinical Impression(s) / ED Diagnoses Final diagnoses:  Pneumonia of left upper lobe due to infectious organism    Rx / DC Orders ED Discharge Orders          Ordered    cefpodoxime (VANTIN) 200 MG tablet  2 times daily        09/28/22 1444              Kemper Durie, DO 09/28/22 1446

## 2022-09-28 NOTE — Discharge Instructions (Addendum)
You were seen in the emergency department for your cough and shortness of breath.  You did have a pneumonia on your chest x-ray.  You should finish your azithromycin and I have given you a second antibiotic that you can start today and you should complete this as prescribed.  You can continue to use your inhaler or nebulizer machine as needed for your shortness of breath and they can be used every 4 hours.  You should follow-up with your primary doctor in the next few days to have your symptoms rechecked.  You should return to the emergency department if you are having worsening shortness of breath, fevers despite your antibiotics or any other new or concerning symptoms.  Your sodium today was 126, please have this followed up with your endocrinologist.   Your chest XR showed no nodules: CHEST - 2 VIEW    COMPARISON:  None Available.    FINDINGS:  Normal cardiac silhouette. Chronic atelectasis in lingula.  Superimpose streakiness within the lingular lobe noted. RIGHT lung  relatively clear. Lungs mildly hyperinflated.    IMPRESSION:  Concern for lingular pneumonia superimposed on chronic atelectasis.

## 2022-09-28 NOTE — ED Notes (Signed)
Pt wheeled to waiting room. Pt verbalized understanding of discharge instructions.   

## 2022-10-02 ENCOUNTER — Emergency Department (HOSPITAL_BASED_OUTPATIENT_CLINIC_OR_DEPARTMENT_OTHER)
Admission: EM | Admit: 2022-10-02 | Discharge: 2022-10-02 | Disposition: A | Discharge status: Home / Self Care | Payer: PPO | Attending: Emergency Medicine | Admitting: Emergency Medicine

## 2022-10-02 ENCOUNTER — Other Ambulatory Visit (HOSPITAL_BASED_OUTPATIENT_CLINIC_OR_DEPARTMENT_OTHER): Payer: Self-pay

## 2022-10-02 ENCOUNTER — Other Ambulatory Visit: Payer: Self-pay

## 2022-10-02 ENCOUNTER — Emergency Department (HOSPITAL_BASED_OUTPATIENT_CLINIC_OR_DEPARTMENT_OTHER): Payer: PPO

## 2022-10-02 ENCOUNTER — Encounter (HOSPITAL_BASED_OUTPATIENT_CLINIC_OR_DEPARTMENT_OTHER): Payer: Self-pay | Admitting: Emergency Medicine

## 2022-10-02 DIAGNOSIS — R059 Cough, unspecified: Secondary | ICD-10-CM | POA: Insufficient documentation

## 2022-10-02 DIAGNOSIS — R5383 Other fatigue: Secondary | ICD-10-CM | POA: Insufficient documentation

## 2022-10-02 DIAGNOSIS — R0602 Shortness of breath: Secondary | ICD-10-CM | POA: Diagnosis not present

## 2022-10-02 DIAGNOSIS — Z7982 Long term (current) use of aspirin: Secondary | ICD-10-CM | POA: Insufficient documentation

## 2022-10-02 DIAGNOSIS — J9811 Atelectasis: Secondary | ICD-10-CM | POA: Diagnosis not present

## 2022-10-02 DIAGNOSIS — R7989 Other specified abnormal findings of blood chemistry: Secondary | ICD-10-CM | POA: Diagnosis not present

## 2022-10-02 DIAGNOSIS — R911 Solitary pulmonary nodule: Secondary | ICD-10-CM | POA: Diagnosis not present

## 2022-10-02 LAB — CBC
HCT: 37.7 % — ABNORMAL LOW (ref 39.0–52.0)
Hemoglobin: 13.2 g/dL (ref 13.0–17.0)
MCH: 32.7 pg (ref 26.0–34.0)
MCHC: 35 g/dL (ref 30.0–36.0)
MCV: 93.3 fL (ref 80.0–100.0)
Platelets: 325 10*3/uL (ref 150–400)
RBC: 4.04 MIL/uL — ABNORMAL LOW (ref 4.22–5.81)
RDW: 13.9 % (ref 11.5–15.5)
WBC: 6.8 10*3/uL (ref 4.0–10.5)
nRBC: 0 % (ref 0.0–0.2)

## 2022-10-02 LAB — BASIC METABOLIC PANEL
Anion gap: 8 (ref 5–15)
BUN: 13 mg/dL (ref 8–23)
CO2: 28 mmol/L (ref 22–32)
Calcium: 9.7 mg/dL (ref 8.9–10.3)
Chloride: 90 mmol/L — ABNORMAL LOW (ref 98–111)
Creatinine, Ser: 0.65 mg/dL (ref 0.61–1.24)
GFR, Estimated: >60 mL/min (ref >60)
Glucose, Bld: 181 mg/dL — ABNORMAL HIGH (ref 70–99)
Potassium: 4.3 mmol/L (ref 3.5–5.1)
Sodium: 126 mmol/L — ABNORMAL LOW (ref 135–145)

## 2022-10-02 LAB — TROPONIN I (HIGH SENSITIVITY)
Troponin I (High Sensitivity): 3 ng/L (ref <18)
Troponin I (High Sensitivity): 3 ng/L (ref <18)

## 2022-10-02 LAB — OCCULT BLOOD X 1 CARD TO LAB, STOOL: Fecal Occult Bld: NEGATIVE

## 2022-10-02 MED ORDER — IOHEXOL 350 MG/ML SOLN
100.0000 mL | Freq: Once | INTRAVENOUS | Status: AC | PRN
  Administered 2022-10-02: 75 mL via INTRAVENOUS

## 2022-10-02 MED ORDER — IPRATROPIUM-ALBUTEROL 0.5-2.5 (3) MG/3ML IN SOLN
RESPIRATORY_TRACT | Status: AC
  Filled 2022-10-02: qty 3

## 2022-10-02 MED ORDER — IPRATROPIUM-ALBUTEROL 0.5-2.5 (3) MG/3ML IN SOLN
3.0000 mL | Freq: Once | RESPIRATORY_TRACT | Status: AC
  Administered 2022-10-02: 3 mL via RESPIRATORY_TRACT

## 2022-10-02 NOTE — ED Provider Notes (Signed)
[facility] Provider Note   CSN: [id number] Arrival date & time: 10/02/22  1103     History  Chief Complaint  Patient presents with   Shortness of Breath    [name] is a 87 y.o. male.  HPI   87 year old male presents to the emergency department with concern for shortness of breath and fatigue as well as an episode of black stool.  Patient was diagnosed with pneumonia about a week ago.  Has been taking antibiotics with improvement but continues with intermittently productive cough, exertional shortness of breath and fatigue.  He states he had an episode of black stool after starting the antibiotic.  He does not take any anticoagulation.  There is been no other rectal bleeding/blood in the stool.  Denies any swelling of his legs, chest pain, back pain.  Home Medications Prior to Admission medications   Medication Sig Start Date End Date Taking? Authorizing Provider  albuterol (PROVENTIL HFA;VENTOLIN HFA) 108 (90 BASE) MCG/ACT inhaler Inhale 2 puffs into the lungs every 6 (six) hours as needed for wheezing or shortness of breath.    [provider]  Ascorbic Acid (VITAMIN C PO) Take 1 tablet by mouth daily.    [provider]  aspirin 81 MG tablet Take 81 mg by mouth daily.    [provider]  cefpodoxime (VANTIN) 200 MG tablet Take 1 tablet (200 mg total) by mouth 2 (two) times daily for 7 days. 09/28/22 10/05/22  Leanord Asal K, DO  cetirizine (ZYRTEC) 10 MG tablet Take 10 mg by mouth daily.    [provider]  famotidine (PEPCID) 20 MG tablet One at bedtime Patient taking differently: Take 20 mg by mouth at bedtime. 04/08/16   Tanda Rockers, MD  finasteride (PROSCAR) 5 MG tablet Take 5 mg by mouth daily.    [provider]  folic acid (FOLVITE) 1 MG tablet Take 1 mg by mouth daily.    [provider]  gabapentin (NEURONTIN) 100 MG capsule Take 1 capsule (100 mg total) by  mouth at bedtime. 02/04/22   Lomax, Amy, NP  glipiZIDE (GLUCOTROL XL) 10 MG 24 hr tablet Take 10 mg by mouth daily. 01/23/20   [provider]  losartan (COZAAR) 100 MG tablet Take 100 mg by mouth daily. 03/20/19   [provider]  metFORMIN (GLUCOPHAGE) 500 MG tablet Take 1 tablet (500 mg total) by mouth 2 (two) times daily with a meal. Resume tonight. 09/09/14   Bonnielee Haff, MD  methotrexate 2.5 MG tablet Take 2.5 mg by mouth once a week. Take 6 tablets orally once a week    [provider]  Multiple Vitamin (MULTIVITAMIN WITH MINERALS) TABS Take 1 tablet by mouth daily.    [provider]  omeprazole (PRILOSEC) 40 MG capsule Take 40 mg by mouth daily.    [provider]  ONE TOUCH ULTRA TEST test strip  09/28/15   [provider]  Jonetta Speak LANCETS [id number] [place]  09/28/15   [provider]  SYMBICORT 160-4.5 MCG/ACT inhaler INHALE 2 PUFFS INTO THE LUNGS 2 TIMES DAILY Patient taking differently: Inhale 2 puffs into the lungs 2 (two) times daily. 11/04/16   Tanda Rockers, MD  Tamsulosin HCl (FLOMAX) 0.4 MG CAPS Take 0.4 mg by mouth daily.    [provider]      Allergies    Lisinopril, Other, Pneumococcal vac polyvalent, Prednisone, Prevnar 13 [pneumococcal 13-val conj vacc], Penicillins, and Sulfa  antibiotics    Review of Systems   Review of Systems  Constitutional:  Positive for fatigue. Negative for fever.  Respiratory:  Positive for cough and shortness of breath. Negative for chest tightness.   Cardiovascular:  Negative for chest pain and leg swelling.  Gastrointestinal:  Negative for abdominal pain, diarrhea and vomiting.  Musculoskeletal:  Negative for back pain.  Skin:  Negative for rash.  Neurological:  Negative for headaches.    Physical Exam Updated Vital Signs BP (!) 158/94   Pulse (!) 105   Temp 99.3 F (37.4 C)   Resp 19   Ht 5\' 6"  (1.676 m)   Wt 94.6 kg   SpO2 93%   BMI 33.66 kg/m  Physical  Exam  ED Results / Procedures / Treatments   Labs (all labs ordered are listed, but only abnormal results are displayed) Labs Reviewed  BASIC METABOLIC PANEL - Abnormal; Notable for the following components:      Result Value   Sodium 126 (*)    Chloride 90 (*)    Glucose, Bld 181 (*)    All other components within normal limits  CBC - Abnormal; Notable for the following components:   RBC 4.04 (*)    HCT 37.7 (*)    All other components within normal limits  TROPONIN I (HIGH SENSITIVITY)  TROPONIN I (HIGH SENSITIVITY)    EKG None  Radiology DG Chest Portable 1 View  Result Date: 10/02/2022 CLINICAL DATA:  Shortness of breath EXAM: PORTABLE CHEST 1 VIEW COMPARISON:  09/28/2022 x-ray and older FINDINGS: Hyperinflation. Diffuse interstitial changes. There is some basilar atelectasis developing bilaterally. Previous area of density along the lingula is improving. No pneumothorax, effusion or edema. Stable cardiopericardial silhouette. Old right-sided rib fractures. IMPRESSION: Improving lingular opacity. Hyperinflation with chronic lung changes and some basilar atelectasis Electronically Signed   By: Jill Side M.D.   On: 10/02/2022 11:51    Procedures Procedures    Medications Ordered in ED Medications  ipratropium-albuterol (DUONEB) 0.5-2.5 (3) MG/3ML nebulizer solution (  Not Given 10/02/22 1126)  ipratropium-albuterol (DUONEB) 0.5-2.5 (3) MG/3ML nebulizer solution 3 mL (3 mLs Nebulization Given 10/02/22 1123)  iohexol (OMNIPAQUE) 350 MG/ML injection 100 mL (75 mLs Intravenous Contrast Given 10/02/22 1329)    ED Course/ Medical Decision Making/ A&P                             Medical Decision Making Amount and/or Complexity of Data Reviewed Labs: ordered. Radiology: ordered.  Risk Prescription drug management.   87 year old male with recent this is a pneumonia, currently on antibiotics presents emergency department ongoing shortness of breath and concern for episode  of black stools.  He is not on anticoagulation.  Was tachycardic on arrival but is otherwise comfortable appearing, no hypoxia.  Blood work is baseline for the patient, stable hemoglobin.  Stable hyponatremia.  Cardiac workup is negative.  Chest x-ray shows improving findings in the lingula.  After using the restroom the patient was tachycardic and did appear winded.  For this reason a CT PE study.  This was negative for pulmonary embolism, identified ongoing scarring/atelectasis.  In regards to the dark stool, fecal occult is negative.  On reevaluation patient appears well, denies any active chest pain or shortness of breath.  Still ongoing cough.  Advised the patient to continue with his albuterol inhaler and finish his course of antibiotics and be very evaluated with primary doctor as an outpatient.  Patient at this time appears safe and stable for discharge and close outpatient follow up. Discharge plan and strict return to ED precautions discussed, patient verbalizes understanding and agreement.        Final Clinical Impression(s) / ED Diagnoses Final diagnoses:  None    Rx / DC Orders ED Discharge Orders     None         Lorelle Gibbs, DO 10/02/22 1518

## 2022-10-02 NOTE — Discharge Instructions (Addendum)
You have been seen and discharged from the emergency department.  Your blood work was normal for you.  The stool study was negative for bleeding.  Your heart workup was normal.  The change in color is most likely secondary to your antibiotic.  Please continue to take the antibiotic as directed.  The CT of your chest showed no blood clot but did identify ongoing scarring.  Follow-up with your primary provider for further evaluation and further care. Take home medications as prescribed. If you have any worsening symptoms or further concerns for your health please return to an emergency department for further evaluation.

## 2022-10-02 NOTE — ED Triage Notes (Signed)
Pt reports SHOB and black stool. Pt states he was dx with pneumonia on Saturday. Pt reports his stool changed after taking the antibiotic.

## 2022-10-02 NOTE — ED Notes (Signed)
Patient transported to CT 

## 2022-10-10 DIAGNOSIS — J449 Chronic obstructive pulmonary disease, unspecified: Secondary | ICD-10-CM | POA: Diagnosis not present

## 2022-10-10 DIAGNOSIS — H353290 Exudative age-related macular degeneration, unspecified eye, stage unspecified: Secondary | ICD-10-CM | POA: Diagnosis not present

## 2022-10-10 DIAGNOSIS — E1142 Type 2 diabetes mellitus with diabetic polyneuropathy: Secondary | ICD-10-CM | POA: Diagnosis not present

## 2022-10-10 DIAGNOSIS — M069 Rheumatoid arthritis, unspecified: Secondary | ICD-10-CM | POA: Diagnosis not present

## 2022-10-10 DIAGNOSIS — J189 Pneumonia, unspecified organism: Secondary | ICD-10-CM | POA: Diagnosis not present

## 2022-10-10 DIAGNOSIS — E871 Hypo-osmolality and hyponatremia: Secondary | ICD-10-CM | POA: Diagnosis not present

## 2022-11-06 DIAGNOSIS — M545 Low back pain, unspecified: Secondary | ICD-10-CM | POA: Diagnosis not present

## 2022-11-11 DIAGNOSIS — R0781 Pleurodynia: Secondary | ICD-10-CM | POA: Diagnosis not present

## 2022-11-20 DIAGNOSIS — M546 Pain in thoracic spine: Secondary | ICD-10-CM | POA: Diagnosis not present

## 2022-11-26 DIAGNOSIS — Z0000 Encounter for general adult medical examination without abnormal findings: Secondary | ICD-10-CM | POA: Diagnosis not present

## 2022-11-26 DIAGNOSIS — E1140 Type 2 diabetes mellitus with diabetic neuropathy, unspecified: Secondary | ICD-10-CM | POA: Diagnosis not present

## 2022-11-27 DIAGNOSIS — Z9181 History of falling: Secondary | ICD-10-CM | POA: Diagnosis not present

## 2022-11-27 DIAGNOSIS — E11319 Type 2 diabetes mellitus with unspecified diabetic retinopathy without macular edema: Secondary | ICD-10-CM | POA: Diagnosis not present

## 2022-11-27 DIAGNOSIS — I728 Aneurysm of other specified arteries: Secondary | ICD-10-CM | POA: Diagnosis not present

## 2022-11-27 DIAGNOSIS — M1990 Unspecified osteoarthritis, unspecified site: Secondary | ICD-10-CM | POA: Diagnosis not present

## 2022-11-27 DIAGNOSIS — E785 Hyperlipidemia, unspecified: Secondary | ICD-10-CM | POA: Diagnosis not present

## 2022-11-27 DIAGNOSIS — E1140 Type 2 diabetes mellitus with diabetic neuropathy, unspecified: Secondary | ICD-10-CM | POA: Diagnosis not present

## 2022-11-27 DIAGNOSIS — J45909 Unspecified asthma, uncomplicated: Secondary | ICD-10-CM | POA: Diagnosis not present

## 2022-11-27 DIAGNOSIS — I10 Essential (primary) hypertension: Secondary | ICD-10-CM | POA: Diagnosis not present

## 2022-11-27 DIAGNOSIS — E871 Hypo-osmolality and hyponatremia: Secondary | ICD-10-CM | POA: Diagnosis not present

## 2022-11-27 DIAGNOSIS — D649 Anemia, unspecified: Secondary | ICD-10-CM | POA: Diagnosis not present

## 2022-11-27 DIAGNOSIS — M069 Rheumatoid arthritis, unspecified: Secondary | ICD-10-CM | POA: Diagnosis not present

## 2022-11-27 DIAGNOSIS — Z0000 Encounter for general adult medical examination without abnormal findings: Secondary | ICD-10-CM | POA: Diagnosis not present

## 2022-12-01 ENCOUNTER — Emergency Department (HOSPITAL_BASED_OUTPATIENT_CLINIC_OR_DEPARTMENT_OTHER): Payer: PPO | Admitting: Radiology

## 2022-12-01 ENCOUNTER — Emergency Department (HOSPITAL_BASED_OUTPATIENT_CLINIC_OR_DEPARTMENT_OTHER)
Admission: EM | Admit: 2022-12-01 | Discharge: 2022-12-01 | Disposition: A | Discharge status: Home / Self Care | Payer: PPO | Attending: Emergency Medicine | Admitting: Emergency Medicine

## 2022-12-01 ENCOUNTER — Encounter (HOSPITAL_BASED_OUTPATIENT_CLINIC_OR_DEPARTMENT_OTHER): Payer: Self-pay

## 2022-12-01 ENCOUNTER — Other Ambulatory Visit: Payer: Self-pay

## 2022-12-01 DIAGNOSIS — R0981 Nasal congestion: Secondary | ICD-10-CM | POA: Insufficient documentation

## 2022-12-01 DIAGNOSIS — R059 Cough, unspecified: Secondary | ICD-10-CM | POA: Insufficient documentation

## 2022-12-01 DIAGNOSIS — Z7982 Long term (current) use of aspirin: Secondary | ICD-10-CM | POA: Insufficient documentation

## 2022-12-01 DIAGNOSIS — E871 Hypo-osmolality and hyponatremia: Secondary | ICD-10-CM | POA: Insufficient documentation

## 2022-12-01 DIAGNOSIS — R0602 Shortness of breath: Secondary | ICD-10-CM | POA: Diagnosis not present

## 2022-12-01 LAB — BASIC METABOLIC PANEL
Anion gap: 9 (ref 5–15)
BUN: 12 mg/dL (ref 8–23)
CO2: 25 mmol/L (ref 22–32)
Calcium: 9.4 mg/dL (ref 8.9–10.3)
Chloride: 87 mmol/L — ABNORMAL LOW (ref 98–111)
Creatinine, Ser: 0.49 mg/dL — ABNORMAL LOW (ref 0.61–1.24)
GFR, Estimated: >60 mL/min (ref >60)
Glucose, Bld: 204 mg/dL — ABNORMAL HIGH (ref 70–99)
Potassium: 4.4 mmol/L (ref 3.5–5.1)
Sodium: 121 mmol/L — ABNORMAL LOW (ref 135–145)

## 2022-12-01 LAB — CBC
HCT: 36.2 % — ABNORMAL LOW (ref 39.0–52.0)
Hemoglobin: 12.9 g/dL — ABNORMAL LOW (ref 13.0–17.0)
MCH: 32.3 pg (ref 26.0–34.0)
MCHC: 35.6 g/dL (ref 30.0–36.0)
MCV: 90.5 fL (ref 80.0–100.0)
Platelets: 296 10*3/uL (ref 150–400)
RBC: 4 MIL/uL — ABNORMAL LOW (ref 4.22–5.81)
RDW: 13.8 % (ref 11.5–15.5)
WBC: 7.5 10*3/uL (ref 4.0–10.5)
nRBC: 0 % (ref 0.0–0.2)

## 2022-12-01 MED ORDER — IPRATROPIUM-ALBUTEROL 0.5-2.5 (3) MG/3ML IN SOLN
RESPIRATORY_TRACT | Status: AC
  Administered 2022-12-01: 3 mL via RESPIRATORY_TRACT
  Filled 2022-12-01: qty 3

## 2022-12-01 MED ORDER — IPRATROPIUM-ALBUTEROL 0.5-2.5 (3) MG/3ML IN SOLN: 3.0000 mL | Freq: Once | RESPIRATORY_TRACT | Status: AC

## 2022-12-01 NOTE — ED Notes (Addendum)
Pt was able to drink fluids... No N/V... Provider notified.Marland KitchenMarland Kitchen

## 2022-12-01 NOTE — ED Triage Notes (Signed)
Patient here POV from Home.  Endorses SOB for a few days. Productive Cough. No Known fevers.   History of Asthma with some relief from medications. History of PNA  NAD Noted during Triage. A&Ox4. GCS 15. Ambulatory.

## 2022-12-01 NOTE — ED Provider Notes (Signed)
EMERGENCY DEPARTMENT AT [facility] Provider Note   CSN: [id number] Arrival date & time: 12/01/22  1358     History  Chief Complaint  Patient presents with   Shortness of Breath    [name] is a 87 y.o. male.  Patient reports he felt short of breath earlier today.  Patient states that after he was given the breathing treatment he feels like he is back to normal.  Patient states that he had pneumonia in March and is concerned that he could have pneumonia again.  Patient denies having any fever or chills he reports he has had a cough he has had some congestion.  Patient reports he has been eating and drinking normally he states overall he feels well.  Patient reports that he has an inhaler at home.  He states that he did not use it.  Patient's wife states that she was concerned that he could have pneumonia because he was snoring last night.  She states he normally does not snore.  The history is provided by the spouse and the patient. No language interpreter was used.  Shortness of Breath Severity:  Moderate Onset quality:  Gradual Duration:  2 days Timing:  Constant Progression:  Worsening Chronicity:  New Relieved by:  Nothing Worsened by:  Nothing Ineffective treatments:  None tried Associated symptoms: no fever and no headaches   Risk factors: no tobacco use        Home Medications Prior to Admission medications   Medication Sig Start Date End Date Taking? Authorizing Provider  albuterol (PROVENTIL HFA;VENTOLIN HFA) 108 (90 BASE) MCG/ACT inhaler Inhale 2 puffs into the lungs every 6 (six) hours as needed for wheezing or shortness of breath.    [provider]  Ascorbic Acid (VITAMIN C PO) Take 1 tablet by mouth daily.    [provider]  aspirin 81 MG tablet Take 81 mg by mouth daily.    [provider]  cetirizine (ZYRTEC) 10 MG tablet Take 10 mg by mouth daily.    [provider]  famotidine (PEPCID) 20 MG  tablet One at bedtime Patient taking differently: Take 20 mg by mouth at bedtime. 04/08/16   Nyoka Cowden, MD  finasteride (PROSCAR) 5 MG tablet Take 5 mg by mouth daily.    [provider]  folic acid (FOLVITE) 1 MG tablet Take 1 mg by mouth daily.    [provider]  gabapentin (NEURONTIN) 100 MG capsule Take 1 capsule (100 mg total) by mouth at bedtime. 02/04/22   Lomax, Amy, NP  glipiZIDE (GLUCOTROL XL) 10 MG 24 hr tablet Take 10 mg by mouth daily. 01/23/20   [provider]  losartan (COZAAR) 100 MG tablet Take 100 mg by mouth daily. 03/20/19   [provider]  metFORMIN (GLUCOPHAGE) 500 MG tablet Take 1 tablet (500 mg total) by mouth 2 (two) times daily with a meal. Resume tonight. 09/09/14   Osvaldo Shipper, MD  methotrexate 2.5 MG tablet Take 2.5 mg by mouth once a week. Take 6 tablets orally once a week    [provider]  Multiple Vitamin (MULTIVITAMIN WITH MINERALS) TABS Take 1 tablet by mouth daily.    [provider]  omeprazole (PRILOSEC) 40 MG capsule Take 40 mg by mouth daily.    [provider]  ONE TOUCH ULTRA TEST test strip  09/28/15   [provider]  Dola Argyle LANCETS 33G MISC  09/28/15   [provider]  [place]  160-4.5 MCG/ACT inhaler INHALE 2 PUFFS INTO THE LUNGS 2 TIMES DAILY Patient taking differently: Inhale 2 puffs into the lungs 2 (two) times daily. 11/04/16   Nyoka Cowden, MD  Tamsulosin HCl (FLOMAX) 0.4 MG CAPS Take 0.4 mg by mouth daily.    [provider]      Allergies    Lisinopril, Other, Pneumococcal vac polyvalent, Prednisone, Prevnar 13 [pneumococcal 13-val conj vacc], Penicillins, and Sulfa antibiotics    Review of Systems   Review of Systems  Constitutional:  Negative for fever.  Respiratory:  Positive for shortness of breath.   Neurological:  Negative for headaches.  All other systems reviewed and are negative.   Physical Exam Updated Vital Signs BP  (!) 155/89 (BP Location: Right Arm)   Pulse 96   Temp 98.4 F (36.9 C)   Resp 18   Ht 5\' 6"  (1.676 m)   Wt 94.6 kg   SpO2 98%   BMI 33.66 kg/m  Physical Exam Vitals and nursing note reviewed.  Constitutional:      Appearance: He is well-developed.  HENT:     Head: Normocephalic.  Cardiovascular:     Rate and Rhythm: Normal rate and regular rhythm.  Pulmonary:     Effort: Pulmonary effort is normal.     Breath sounds: Normal breath sounds. No decreased breath sounds.  Chest:     Chest wall: No tenderness.  Abdominal:     General: There is no distension.     Palpations: Abdomen is soft.  Musculoskeletal:        General: Normal range of motion.     Cervical back: Normal range of motion.  Skin:    General: Skin is warm.  Neurological:     Mental Status: He is alert and oriented to person, place, and time.     ED Results / Procedures / Treatments   Labs (all labs ordered are listed, but only abnormal results are displayed) Labs Reviewed  BASIC METABOLIC PANEL - Abnormal; Notable for the following components:      Result Value   Sodium 121 (*)    Chloride 87 (*)    Glucose, Bld 204 (*)    Creatinine, Ser 0.49 (*)    All other components within normal limits  CBC - Abnormal; Notable for the following components:   RBC 4.00 (*)    Hemoglobin 12.9 (*)    HCT 36.2 (*)    All other components within normal limits  URINALYSIS, ROUTINE W REFLEX MICROSCOPIC    EKG EKG Interpretation  Date/Time:  Sunday Dec 01 2022 14:10:47 EDT Ventricular Rate:  107 PR Interval:  168 QRS Duration: 90 QT Interval:  340 QTC Calculation: 453 R Axis:   62 Text Interpretation: Sinus tachycardia Right atrial enlargement When compared with ECG of 02-Oct-2022 11:14, PREVIOUS ECG IS PRESENT Confirmed by Alvester Chou [id number]) on 12/01/2022 4:48:15 PM  Radiology DG Chest 2 View  Result Date: 12/01/2022 CLINICAL DATA:  Shortness of breath and cough. EXAM: CHEST - 2 VIEW COMPARISON:   10/02/2022 CT, radiograph and prior studies FINDINGS: The cardiomediastinal silhouette is unchanged. Chronic peribronchial thickening and mild bibasilar opacities/atelectasis are unchanged. There is no evidence of pneumothorax or pleural effusion. A 20% SUPERIOR endplate compression fracture of a midthoracic vertebra is new since 10/02/2022 CT. No other acute bony abnormalities noted. IMPRESSION: 1. No evidence of acute cardiopulmonary disease. Unchanged chronic peribronchial thickening and mild bibasilar opacities/atelectasis. 2. 20% SUPERIOR endplate compression fracture of a midthoracic vertebra, of  uncertain chronicity but is new since 10/02/2022 CT. Correlate with pain. Electronically Signed   By: Harmon Pier M.D.   On: 12/01/2022 15:06    Procedures Procedures    Medications Ordered in ED Medications  ipratropium-albuterol (DUONEB) 0.5-2.5 (3) MG/3ML nebulizer solution 3 mL (3 mLs Nebulization Given 12/01/22 1619)    ED Course/ Medical Decision Making/ A&P                             Medical Decision Making Patient complains of cough and concern for pneumonia  Amount and/or Complexity of Data Reviewed Independent Historian: spouse    Details: And is here with his wife who is supportive External Data Reviewed: notes.    Details: Urgency department visits primary care notes and neurology notes reviewed Labs: ordered. Decision-making details documented in ED Course.    Details: Labs ordered reviewed and interpreted patient has a sodium of 121.  Glucose is 204 Radiology: ordered and independent interpretation performed. Decision-making details documented in ED Course.    Details: Chest x-ray shows no evidence of pneumonia.  Patient has a mid thoracic compression fracture.  Risk Risk Details: I discussed the compression fracture seen in patient's spine.  He states that this is old he states that he has had this for a long time.  Patient reports he did have a recent fall but he was seen  by orthopedist and did not have any fractures.  He reports he is not having any pain in his back.  I advised patient that his sodium is 121 and I feel like he needs admission for treatment.  Patient states that his physician has told him it was not concerning until he was 118.  Patient is not willing to be admitted to the hospital.  He reports he will call his primary care physician tomorrow and discuss further treatment with him.  Advised patient he does need follow-up he should call his primary physician tomorrow.  He understands that he is leaving AGAINST MEDICAL ADVICE.  Understands that I have had advised him to be admitted for medical treatment.           Final Clinical Impression(s) / ED Diagnoses Final diagnoses:  Hyponatremia  Cough, unspecified type    Rx / DC Orders ED Discharge Orders     None     An After Visit Summary was printed and given to the patient.     Elson Areas, [place] 12/01/22 1855    Terald Sleeper, MD 12/01/22 2000

## 2022-12-01 NOTE — Discharge Instructions (Addendum)
Call your Physician in the am to discuss treatment.  Your sodium was 121 today.

## 2022-12-05 DIAGNOSIS — J45901 Unspecified asthma with (acute) exacerbation: Secondary | ICD-10-CM | POA: Diagnosis not present

## 2022-12-05 DIAGNOSIS — E222 Syndrome of inappropriate secretion of antidiuretic hormone: Secondary | ICD-10-CM | POA: Diagnosis not present

## 2022-12-05 DIAGNOSIS — R918 Other nonspecific abnormal finding of lung field: Secondary | ICD-10-CM | POA: Diagnosis not present

## 2022-12-05 DIAGNOSIS — R9389 Abnormal findings on diagnostic imaging of other specified body structures: Secondary | ICD-10-CM | POA: Diagnosis not present

## 2022-12-05 DIAGNOSIS — E871 Hypo-osmolality and hyponatremia: Secondary | ICD-10-CM | POA: Diagnosis not present

## 2022-12-11 DIAGNOSIS — E871 Hypo-osmolality and hyponatremia: Secondary | ICD-10-CM | POA: Diagnosis not present

## 2022-12-16 DIAGNOSIS — R5383 Other fatigue: Secondary | ICD-10-CM | POA: Diagnosis not present

## 2022-12-16 DIAGNOSIS — E871 Hypo-osmolality and hyponatremia: Secondary | ICD-10-CM | POA: Diagnosis not present

## 2022-12-20 DIAGNOSIS — E871 Hypo-osmolality and hyponatremia: Secondary | ICD-10-CM | POA: Diagnosis not present

## 2022-12-20 DIAGNOSIS — R918 Other nonspecific abnormal finding of lung field: Secondary | ICD-10-CM | POA: Diagnosis not present

## 2022-12-20 DIAGNOSIS — R9389 Abnormal findings on diagnostic imaging of other specified body structures: Secondary | ICD-10-CM | POA: Diagnosis not present

## 2022-12-20 DIAGNOSIS — E222 Syndrome of inappropriate secretion of antidiuretic hormone: Secondary | ICD-10-CM | POA: Diagnosis not present

## 2022-12-23 ENCOUNTER — Other Ambulatory Visit: Payer: Self-pay | Admitting: Internal Medicine

## 2022-12-23 DIAGNOSIS — R918 Other nonspecific abnormal finding of lung field: Secondary | ICD-10-CM

## 2022-12-23 DIAGNOSIS — E222 Syndrome of inappropriate secretion of antidiuretic hormone: Secondary | ICD-10-CM

## 2023-01-01 DIAGNOSIS — E222 Syndrome of inappropriate secretion of antidiuretic hormone: Secondary | ICD-10-CM | POA: Diagnosis not present

## 2023-01-24 DIAGNOSIS — M7022 Olecranon bursitis, left elbow: Secondary | ICD-10-CM | POA: Diagnosis not present

## 2023-01-24 DIAGNOSIS — R5383 Other fatigue: Secondary | ICD-10-CM | POA: Diagnosis not present

## 2023-01-31 DIAGNOSIS — E871 Hypo-osmolality and hyponatremia: Secondary | ICD-10-CM | POA: Diagnosis not present

## 2023-01-31 DIAGNOSIS — E222 Syndrome of inappropriate secretion of antidiuretic hormone: Secondary | ICD-10-CM | POA: Diagnosis not present

## 2023-02-03 DIAGNOSIS — Z87440 Personal history of urinary (tract) infections: Secondary | ICD-10-CM | POA: Diagnosis not present

## 2023-02-03 DIAGNOSIS — E871 Hypo-osmolality and hyponatremia: Secondary | ICD-10-CM | POA: Diagnosis not present

## 2023-02-03 DIAGNOSIS — E222 Syndrome of inappropriate secretion of antidiuretic hormone: Secondary | ICD-10-CM | POA: Diagnosis not present

## 2023-02-03 DIAGNOSIS — R9389 Abnormal findings on diagnostic imaging of other specified body structures: Secondary | ICD-10-CM | POA: Diagnosis not present

## 2023-02-03 DIAGNOSIS — R918 Other nonspecific abnormal finding of lung field: Secondary | ICD-10-CM | POA: Diagnosis not present

## 2023-02-09 DIAGNOSIS — R350 Frequency of micturition: Secondary | ICD-10-CM | POA: Diagnosis not present

## 2023-02-13 DIAGNOSIS — M79641 Pain in right hand: Secondary | ICD-10-CM | POA: Diagnosis not present

## 2023-02-13 DIAGNOSIS — J449 Chronic obstructive pulmonary disease, unspecified: Secondary | ICD-10-CM | POA: Diagnosis not present

## 2023-02-13 DIAGNOSIS — E119 Type 2 diabetes mellitus without complications: Secondary | ICD-10-CM | POA: Diagnosis not present

## 2023-02-13 DIAGNOSIS — G629 Polyneuropathy, unspecified: Secondary | ICD-10-CM | POA: Diagnosis not present

## 2023-02-13 DIAGNOSIS — Z79899 Other long term (current) drug therapy: Secondary | ICD-10-CM | POA: Diagnosis not present

## 2023-02-13 DIAGNOSIS — M1990 Unspecified osteoarthritis, unspecified site: Secondary | ICD-10-CM | POA: Diagnosis not present

## 2023-02-13 DIAGNOSIS — M19049 Primary osteoarthritis, unspecified hand: Secondary | ICD-10-CM | POA: Diagnosis not present

## 2023-02-13 DIAGNOSIS — M069 Rheumatoid arthritis, unspecified: Secondary | ICD-10-CM | POA: Diagnosis not present

## 2023-02-27 ENCOUNTER — Ambulatory Visit: Payer: PPO | Admitting: Neurology

## 2023-02-28 DIAGNOSIS — J988 Other specified respiratory disorders: Secondary | ICD-10-CM | POA: Diagnosis not present

## 2023-02-28 DIAGNOSIS — E871 Hypo-osmolality and hyponatremia: Secondary | ICD-10-CM | POA: Diagnosis not present

## 2023-02-28 DIAGNOSIS — R5383 Other fatigue: Secondary | ICD-10-CM | POA: Diagnosis not present

## 2023-02-28 DIAGNOSIS — E1140 Type 2 diabetes mellitus with diabetic neuropathy, unspecified: Secondary | ICD-10-CM | POA: Diagnosis not present

## 2023-03-06 ENCOUNTER — Telehealth: Payer: Self-pay | Admitting: Family Medicine

## 2023-03-06 NOTE — Telephone Encounter (Signed)
Called pt and he stated that his has been having pain in his feet. Pt states that his feel heavy. Pt states that his pain isn't sever, but he would like to try anything for his discomfort. Told pt Amy will be notified about this manner and we will call him back on Monday. Pt verbalized understanding.

## 2023-03-06 NOTE — Telephone Encounter (Signed)
Pt states the Neuropathy in feet is getting bad and causing pain when pt walks.  Pt would like a call to discuss.

## 2023-03-10 NOTE — Telephone Encounter (Signed)
Called and spoke to patient and he states he may want to try the gabapentin again but will wait until his appointment on 09.04.2024

## 2023-03-19 ENCOUNTER — Ambulatory Visit: Payer: PPO | Admitting: Neurology

## 2023-03-26 ENCOUNTER — Inpatient Hospital Stay: Admission: RE | Admit: 2023-03-26 | Payer: PPO | Source: Ambulatory Visit

## 2023-03-27 ENCOUNTER — Emergency Department (HOSPITAL_BASED_OUTPATIENT_CLINIC_OR_DEPARTMENT_OTHER)
Admission: EM | Admit: 2023-03-27 | Discharge: 2023-03-27 | Disposition: A | Discharge status: Home / Self Care | Payer: PPO | Attending: Emergency Medicine | Admitting: Emergency Medicine

## 2023-03-27 ENCOUNTER — Other Ambulatory Visit: Payer: Self-pay

## 2023-03-27 ENCOUNTER — Emergency Department (HOSPITAL_BASED_OUTPATIENT_CLINIC_OR_DEPARTMENT_OTHER): Payer: PPO | Admitting: Radiology

## 2023-03-27 ENCOUNTER — Encounter (HOSPITAL_BASED_OUTPATIENT_CLINIC_OR_DEPARTMENT_OTHER): Payer: Self-pay

## 2023-03-27 DIAGNOSIS — I10 Essential (primary) hypertension: Secondary | ICD-10-CM | POA: Insufficient documentation

## 2023-03-27 DIAGNOSIS — E1140 Type 2 diabetes mellitus with diabetic neuropathy, unspecified: Secondary | ICD-10-CM | POA: Insufficient documentation

## 2023-03-27 DIAGNOSIS — I700 Atherosclerosis of aorta: Secondary | ICD-10-CM | POA: Diagnosis not present

## 2023-03-27 DIAGNOSIS — R202 Paresthesia of skin: Secondary | ICD-10-CM | POA: Diagnosis present

## 2023-03-27 DIAGNOSIS — Z7982 Long term (current) use of aspirin: Secondary | ICD-10-CM | POA: Diagnosis not present

## 2023-03-27 DIAGNOSIS — Z7951 Long term (current) use of inhaled steroids: Secondary | ICD-10-CM | POA: Diagnosis not present

## 2023-03-27 DIAGNOSIS — G629 Polyneuropathy, unspecified: Secondary | ICD-10-CM | POA: Insufficient documentation

## 2023-03-27 DIAGNOSIS — Z79899 Other long term (current) drug therapy: Secondary | ICD-10-CM | POA: Diagnosis not present

## 2023-03-27 DIAGNOSIS — J449 Chronic obstructive pulmonary disease, unspecified: Secondary | ICD-10-CM | POA: Diagnosis not present

## 2023-03-27 DIAGNOSIS — E871 Hypo-osmolality and hyponatremia: Secondary | ICD-10-CM | POA: Insufficient documentation

## 2023-03-27 DIAGNOSIS — J929 Pleural plaque without asbestos: Secondary | ICD-10-CM | POA: Diagnosis not present

## 2023-03-27 DIAGNOSIS — I249 Acute ischemic heart disease, unspecified: Secondary | ICD-10-CM | POA: Diagnosis not present

## 2023-03-27 DIAGNOSIS — Z7984 Long term (current) use of oral hypoglycemic drugs: Secondary | ICD-10-CM | POA: Diagnosis not present

## 2023-03-27 DIAGNOSIS — I771 Stricture of artery: Secondary | ICD-10-CM | POA: Diagnosis not present

## 2023-03-27 LAB — CBC
HCT: 36.8 % — ABNORMAL LOW (ref 39.0–52.0)
Hemoglobin: 12.9 g/dL — ABNORMAL LOW (ref 13.0–17.0)
MCH: 32.2 pg (ref 26.0–34.0)
MCHC: 35.1 g/dL (ref 30.0–36.0)
MCV: 91.8 fL (ref 80.0–100.0)
Platelets: 247 10*3/uL (ref 150–400)
RBC: 4.01 MIL/uL — ABNORMAL LOW (ref 4.22–5.81)
RDW: 14.7 % (ref 11.5–15.5)
WBC: 13.5 10*3/uL — ABNORMAL HIGH (ref 4.0–10.5)
nRBC: 0 % (ref 0.0–0.2)

## 2023-03-27 LAB — BASIC METABOLIC PANEL
Anion gap: 11 (ref 5–15)
BUN: 15 mg/dL (ref 8–23)
CO2: 25 mmol/L (ref 22–32)
Calcium: 9.6 mg/dL (ref 8.9–10.3)
Chloride: 93 mmol/L — ABNORMAL LOW (ref 98–111)
Creatinine, Ser: 0.53 mg/dL — ABNORMAL LOW (ref 0.61–1.24)
GFR, Estimated: >60 mL/min (ref >60)
Glucose, Bld: 169 mg/dL — ABNORMAL HIGH (ref 70–99)
Potassium: 4.2 mmol/L (ref 3.5–5.1)
Sodium: 129 mmol/L — ABNORMAL LOW (ref 135–145)

## 2023-03-27 LAB — OSMOLALITY: Osmolality: 283 mosm/kg (ref 275–295)

## 2023-03-27 NOTE — ED Notes (Signed)
 RN reviewed discharge instructions with pt. Pt verbalized understanding and had no further questions. VSS upon discharge.  

## 2023-03-27 NOTE — ED Triage Notes (Addendum)
Pt presents with numbness to the 2nd and 3rd digit on the L hand that started yesterday  at 14:30 that later progressed to whole hand numbness. Symptoms have not resolved. Pt with hx of neuropathy. No numbness to  upper arm or other extremities. No weakness, trouble speaking. Mild HA without photophobia noted.

## 2023-03-27 NOTE — ED Provider Notes (Signed)
[place] EMERGENCY DEPARTMENT AT [facility] Provider Note   CSN: [id number] Arrival date & time: 03/27/23  1334     History  Chief Complaint  Patient presents with   Numbness    [name] is a 87 y.o. male.  87 year old male with a history of COPD, diabetes, neuropathy, hypertension, and hyperlipidemia who presents hand numbness.  Patient reports that approximately 24 hours ago had numbness of his third and fourth finger on his left hand that has now progressed to involve his whole hand.  Says it is not painful or tingling.  No other neurologic symptoms at all.  No history of stroke.  No increased keyboard typing or repetitive movements with his left hand.  No trauma.  No chest pain.        Home Medications Prior to Admission medications   Medication Sig Start Date End Date Taking? Authorizing Provider  albuterol (PROVENTIL HFA;VENTOLIN HFA) 108 (90 BASE) MCG/ACT inhaler Inhale 2 puffs into the lungs every 6 (six) hours as needed for wheezing or shortness of breath.    [provider]  Ascorbic Acid (VITAMIN C PO) Take 1 tablet by mouth daily.    [provider]  aspirin 81 MG tablet Take 81 mg by mouth daily.    [provider]  cetirizine (ZYRTEC) 10 MG tablet Take 10 mg by mouth daily.    [provider]  famotidine (PEPCID) 20 MG tablet One at bedtime Patient taking differently: Take 20 mg by mouth at bedtime. 04/08/16   Nyoka Cowden, MD  finasteride (PROSCAR) 5 MG tablet Take 5 mg by mouth daily.    [provider]  folic acid (FOLVITE) 1 MG tablet Take 1 mg by mouth daily.    [provider]  gabapentin (NEURONTIN) 100 MG capsule Take 1 capsule (100 mg total) by mouth at bedtime. 02/04/22   Lomax, Amy, NP  glipiZIDE (GLUCOTROL XL) 10 MG 24 hr tablet Take 10 mg by mouth daily. 01/23/20   [provider]  losartan (COZAAR) 100 MG tablet Take 100 mg by mouth daily. 03/20/19   [provider]   metFORMIN (GLUCOPHAGE) 500 MG tablet Take 1 tablet (500 mg total) by mouth 2 (two) times daily with a meal. Resume tonight. 09/09/14   Osvaldo Shipper, MD  methotrexate 2.5 MG tablet Take 2.5 mg by mouth once a week. Take 6 tablets orally once a week    [provider]  Multiple Vitamin (MULTIVITAMIN WITH MINERALS) TABS Take 1 tablet by mouth daily.    [provider]  omeprazole (PRILOSEC) 40 MG capsule Take 40 mg by mouth daily.    [provider]  ONE TOUCH ULTRA TEST test strip  09/28/15   [provider]  Dola Argyle LANCETS 33G MISC  09/28/15   [provider]  [place] 160-4.5 MCG/ACT inhaler INHALE 2 PUFFS INTO THE LUNGS 2 TIMES DAILY Patient taking differently: Inhale 2 puffs into the lungs 2 (two) times daily. 11/04/16   Nyoka Cowden, MD  Tamsulosin HCl (FLOMAX) 0.4 MG CAPS Take 0.4 mg by mouth daily.    [provider]      Allergies    Lisinopril, Other, Pneumococcal vac polyvalent, Prednisone, Prevnar 13 [pneumococcal 13-val conj vacc], Penicillins, and Sulfa antibiotics    Review of Systems   Review of Systems  Physical Exam Updated Vital Signs BP (!) 145/81   Pulse (!) 102   Temp 99 F (37.2 C) (Oral)   Resp 18  Ht 5\' 10"  (1.778 m)   Wt 90.7 kg   SpO2 95%   BMI 28.70 kg/m  Physical Exam Vitals and nursing note reviewed.  Constitutional:      General: He is not in acute distress.    Appearance: He is well-developed.  HENT:     Head: Normocephalic and atraumatic.     Right Ear: External ear normal.     Left Ear: External ear normal.     Nose: Nose normal.  Eyes:     Extraocular Movements: Extraocular movements intact.     Conjunctiva/sclera: Conjunctivae normal.     Pupils: Pupils are equal, round, and reactive to light.  Cardiovascular:     Comments: Radial pulses 2+ bilaterally Pulmonary:     Effort: Pulmonary effort is normal. No respiratory distress.  Musculoskeletal:     Cervical back:  Normal range of motion and neck supple.     Comments: Negative Tennille and Phalen sign  Skin:    General: Skin is warm and dry.  Neurological:     Mental Status: He is alert. Mental status is at baseline.     Comments: MENTAL STATUS: AAOx3 CRANIAL NERVES: II: Pupils equal and reactive 3 mm BL, no RAPD, no VF deficits III, IV, VI: EOM intact, no gaze preference or deviation, no nystagmus. V: normal sensation to light touch in V1, V2, and V3 segments bilaterally VII: no facial weakness or asymmetry, no nasolabial fold flattening VIII: normal hearing to speech and finger friction IX, X: normal palatal elevation, no uvular deviation XI: 5/5 head turn and 5/5 shoulder shrug bilaterally XII: midline tongue protrusion MOTOR: 5/5 strength in R shoulder flexion, elbow flexion and extension, and grip strength. 5/5 strength in L shoulder flexion, elbow flexion and extension, and grip strength.  5/5 strength in R hip and knee flexion, knee extension, ankle plantar and dorsiflexion. 5/5 strength in L hip and knee flexion, knee extension, ankle plantar and dorsiflexion. SENSORY: Normal sensation to light touch in all extremities COORD: No tremor STATION: No truncal ataxia  Symmetrically palpable radial and ulnar pulses. Capillary refill <2 seconds to all digits.  Intact sensation to light touch of the radial, median and ulnar nerves demonstrated by testing in the dorsal web space of the thumb, the hypothenar eminence of the palm, and the radial aspect of the dorsum of the hand.  Intact motor function of the radial, median and ulnar nerves demonstrated by strength of hand grip, and spreading of the 2nd through 5th digits, thumb apposition, and ability to make "OK sign".  Psychiatric:        Mood and Affect: Mood normal.        Behavior: Behavior normal.     ED Results / Procedures / Treatments   Labs (all labs ordered are listed, but only abnormal results are displayed) Labs Reviewed  BASIC  METABOLIC PANEL - Abnormal; Notable for the following components:      Result Value   Sodium 129 (*)    Chloride 93 (*)    Glucose, Bld 169 (*)    Creatinine, Ser 0.53 (*)    All other components within normal limits  CBC - Abnormal; Notable for the following components:   WBC 13.5 (*)    RBC 4.01 (*)    Hemoglobin 12.9 (*)    HCT 36.8 (*)    All other components within normal limits  OSMOLALITY    EKG EKG Interpretation Date/Time:  Thursday March 27 2023 13:52:15 EDT Ventricular Rate:  113 PR Interval:  166 QRS Duration:  95 QT Interval:  326 QTC Calculation: 447 R Axis:   53  Text Interpretation: Sinus tachycardia RSR' in V1 or V2, right VCD or RVH Confirmed by Alvino Blood ([id number]) on 03/27/2023 3:42:42 PM  Radiology No results found.  Procedures Procedures    Medications Ordered in ED Medications - No data to display  ED Course/ Medical Decision Making/ A&P                                 Medical Decision Making Amount and/or Complexity of Data Reviewed Labs: ordered. Radiology: ordered.   [name] is a 87 y.o. male with comorbidities that complicate the patient evaluation including COPD, diabetes, neuropathy, hypertension, and hyperlipidemia who presents hand numbness.    Initial Ddx:  Hyponatremia, peripheral neuropathy, stroke, spinal cord compression  MDM/Course:  Patient presents emergency department with hand numbness.  Appears to be in a nerve distribution rather than distribution that I would expect from a stroke.  No weakness at this time of his hand.  No other neurologic deficits and no signs of spinal cord compression.  Labs were checked and he is not hyponatremic.  Upon re-evaluation patient was stable.  Will have him follow-up with his primary doctor and neurology.  This patient presents to the ED for concern of complaints listed in HPI, this involves an extensive number of treatment options, and is a complaint that carries with it  a high risk of complications and morbidity. Disposition including potential need for admission considered.   Dispo: DC Home. Return precautions discussed including, but not limited to, those listed in the AVS. Allowed pt time to ask questions which were answered fully prior to dc.  Additional history obtained from spouse Records reviewed Outpatient Clinic Notes The following labs were independently interpreted: Chemistry and show  chronic hyponatremia I personally reviewed and interpreted cardiac monitoring: normal sinus rhythm  I personally reviewed and interpreted the pt's EKG: see above for interpretation  I have reviewed the patients home medications and made adjustments as needed Social Determinants of health:  Elderly         Final Clinical Impression(s) / ED Diagnoses Final diagnoses:  Neuropathy  Hyponatremia    Rx / DC Orders ED Discharge Orders     None         Rondel Baton, MD 03/30/23 1925

## 2023-03-27 NOTE — Discharge Instructions (Signed)
You were seen for your neuropathy in the emergency department.   At home, please continue your medications including your gabapentin.    Follow-up with your primary doctor in 2-3 days regarding your visit.  Follow-up with neurology as soon as possible.  Return immediately to the emergency department if you experience any of the following: Weakness of your hand, slurred speech, vision changes, facial droop, weakness of your arms or legs, or any other concerning symptoms.    Thank you for visiting our Emergency Department. It was a pleasure taking care of you today.

## 2023-04-04 DIAGNOSIS — R202 Paresthesia of skin: Secondary | ICD-10-CM | POA: Diagnosis not present

## 2023-04-04 DIAGNOSIS — E559 Vitamin D deficiency, unspecified: Secondary | ICD-10-CM | POA: Diagnosis not present

## 2023-04-04 DIAGNOSIS — Z23 Encounter for immunization: Secondary | ICD-10-CM | POA: Diagnosis not present

## 2023-04-04 DIAGNOSIS — M069 Rheumatoid arthritis, unspecified: Secondary | ICD-10-CM | POA: Diagnosis not present

## 2023-04-04 DIAGNOSIS — E1142 Type 2 diabetes mellitus with diabetic polyneuropathy: Secondary | ICD-10-CM | POA: Diagnosis not present

## 2023-04-04 DIAGNOSIS — E871 Hypo-osmolality and hyponatremia: Secondary | ICD-10-CM | POA: Diagnosis not present

## 2023-04-04 DIAGNOSIS — R490 Dysphonia: Secondary | ICD-10-CM | POA: Diagnosis not present

## 2023-04-14 DIAGNOSIS — J441 Chronic obstructive pulmonary disease with (acute) exacerbation: Secondary | ICD-10-CM | POA: Diagnosis not present

## 2023-04-14 DIAGNOSIS — R200 Anesthesia of skin: Secondary | ICD-10-CM | POA: Diagnosis not present

## 2023-04-22 DIAGNOSIS — I10 Essential (primary) hypertension: Secondary | ICD-10-CM | POA: Diagnosis not present

## 2023-04-22 DIAGNOSIS — G609 Hereditary and idiopathic neuropathy, unspecified: Secondary | ICD-10-CM | POA: Diagnosis not present

## 2023-04-22 DIAGNOSIS — G5602 Carpal tunnel syndrome, left upper limb: Secondary | ICD-10-CM | POA: Diagnosis not present

## 2023-04-22 DIAGNOSIS — Z9989 Dependence on other enabling machines and devices: Secondary | ICD-10-CM | POA: Diagnosis not present

## 2023-04-22 DIAGNOSIS — R5382 Chronic fatigue, unspecified: Secondary | ICD-10-CM | POA: Diagnosis not present

## 2023-04-24 DIAGNOSIS — Z8709 Personal history of other diseases of the respiratory system: Secondary | ICD-10-CM | POA: Diagnosis not present

## 2023-04-24 DIAGNOSIS — J40 Bronchitis, not specified as acute or chronic: Secondary | ICD-10-CM | POA: Diagnosis not present

## 2023-04-24 DIAGNOSIS — R051 Acute cough: Secondary | ICD-10-CM | POA: Diagnosis not present

## 2023-05-02 ENCOUNTER — Other Ambulatory Visit: Payer: Self-pay | Admitting: Family Medicine

## 2023-05-02 DIAGNOSIS — R0602 Shortness of breath: Secondary | ICD-10-CM

## 2023-05-02 DIAGNOSIS — E871 Hypo-osmolality and hyponatremia: Secondary | ICD-10-CM | POA: Diagnosis not present

## 2023-05-06 DIAGNOSIS — E871 Hypo-osmolality and hyponatremia: Secondary | ICD-10-CM | POA: Diagnosis not present

## 2023-05-09 DIAGNOSIS — R490 Dysphonia: Secondary | ICD-10-CM | POA: Diagnosis not present

## 2023-05-09 DIAGNOSIS — R911 Solitary pulmonary nodule: Secondary | ICD-10-CM | POA: Diagnosis not present

## 2023-05-09 DIAGNOSIS — E871 Hypo-osmolality and hyponatremia: Secondary | ICD-10-CM | POA: Diagnosis not present

## 2023-05-09 DIAGNOSIS — R5383 Other fatigue: Secondary | ICD-10-CM | POA: Diagnosis not present

## 2023-05-09 DIAGNOSIS — J45909 Unspecified asthma, uncomplicated: Secondary | ICD-10-CM | POA: Diagnosis not present

## 2023-05-12 ENCOUNTER — Other Ambulatory Visit: Payer: Self-pay

## 2023-05-12 ENCOUNTER — Emergency Department (HOSPITAL_BASED_OUTPATIENT_CLINIC_OR_DEPARTMENT_OTHER)
Admission: EM | Admit: 2023-05-12 | Discharge: 2023-05-12 | Disposition: A | Discharge status: Home / Self Care | Payer: PPO | Attending: Emergency Medicine | Admitting: Emergency Medicine

## 2023-05-12 ENCOUNTER — Emergency Department (HOSPITAL_BASED_OUTPATIENT_CLINIC_OR_DEPARTMENT_OTHER): Payer: PPO | Admitting: Radiology

## 2023-05-12 ENCOUNTER — Encounter (HOSPITAL_BASED_OUTPATIENT_CLINIC_OR_DEPARTMENT_OTHER): Payer: Self-pay | Admitting: Emergency Medicine

## 2023-05-12 ENCOUNTER — Emergency Department (HOSPITAL_BASED_OUTPATIENT_CLINIC_OR_DEPARTMENT_OTHER): Payer: PPO

## 2023-05-12 DIAGNOSIS — J45909 Unspecified asthma, uncomplicated: Secondary | ICD-10-CM | POA: Diagnosis not present

## 2023-05-12 DIAGNOSIS — J449 Chronic obstructive pulmonary disease, unspecified: Secondary | ICD-10-CM | POA: Insufficient documentation

## 2023-05-12 DIAGNOSIS — Z7984 Long term (current) use of oral hypoglycemic drugs: Secondary | ICD-10-CM | POA: Insufficient documentation

## 2023-05-12 DIAGNOSIS — E119 Type 2 diabetes mellitus without complications: Secondary | ICD-10-CM | POA: Insufficient documentation

## 2023-05-12 DIAGNOSIS — Z7982 Long term (current) use of aspirin: Secondary | ICD-10-CM | POA: Diagnosis not present

## 2023-05-12 DIAGNOSIS — S2241XA Multiple fractures of ribs, right side, initial encounter for closed fracture: Secondary | ICD-10-CM | POA: Diagnosis not present

## 2023-05-12 DIAGNOSIS — R0602 Shortness of breath: Secondary | ICD-10-CM | POA: Diagnosis not present

## 2023-05-12 DIAGNOSIS — R918 Other nonspecific abnormal finding of lung field: Secondary | ICD-10-CM | POA: Diagnosis not present

## 2023-05-12 DIAGNOSIS — I10 Essential (primary) hypertension: Secondary | ICD-10-CM | POA: Insufficient documentation

## 2023-05-12 DIAGNOSIS — Z8709 Personal history of other diseases of the respiratory system: Secondary | ICD-10-CM

## 2023-05-12 LAB — BASIC METABOLIC PANEL
Anion gap: 8 (ref 5–15)
BUN: 13 mg/dL (ref 8–23)
CO2: 27 mmol/L (ref 22–32)
Calcium: 9.9 mg/dL (ref 8.9–10.3)
Chloride: 92 mmol/L — ABNORMAL LOW (ref 98–111)
Creatinine, Ser: 0.46 mg/dL — ABNORMAL LOW (ref 0.61–1.24)
GFR, Estimated: >60 mL/min (ref >60)
Glucose, Bld: 145 mg/dL — ABNORMAL HIGH (ref 70–99)
Potassium: 4.3 mmol/L (ref 3.5–5.1)
Sodium: 127 mmol/L — ABNORMAL LOW (ref 135–145)

## 2023-05-12 LAB — CBC WITH DIFFERENTIAL/PLATELET
Abs Immature Granulocytes: 0.01 10*3/uL (ref 0.00–0.07)
Basophils Absolute: 0.1 10*3/uL (ref 0.0–0.1)
Basophils Relative: 1 %
Eosinophils Absolute: 0.1 10*3/uL (ref 0.0–0.5)
Eosinophils Relative: 1 %
HCT: 36.5 % — ABNORMAL LOW (ref 39.0–52.0)
Hemoglobin: 12.9 g/dL — ABNORMAL LOW (ref 13.0–17.0)
Immature Granulocytes: 0 %
Lymphocytes Relative: 18 %
Lymphs Abs: 0.9 10*3/uL (ref 0.7–4.0)
MCH: 32.2 pg (ref 26.0–34.0)
MCHC: 35.3 g/dL (ref 30.0–36.0)
MCV: 91 fL (ref 80.0–100.0)
Monocytes Absolute: 0.6 10*3/uL (ref 0.1–1.0)
Monocytes Relative: 11 %
Neutro Abs: 3.5 10*3/uL (ref 1.7–7.7)
Neutrophils Relative %: 69 %
Platelets: 235 10*3/uL (ref 150–400)
RBC: 4.01 MIL/uL — ABNORMAL LOW (ref 4.22–5.81)
RDW: 14.4 % (ref 11.5–15.5)
WBC: 5.2 10*3/uL (ref 4.0–10.5)
nRBC: 0 % (ref 0.0–0.2)

## 2023-05-12 MED ORDER — IOHEXOL 300 MG/ML  SOLN: 75.0000 mL | Freq: Once | INTRAMUSCULAR | Status: DC | PRN

## 2023-05-12 MED ORDER — ALBUTEROL SULFATE HFA 108 (90 BASE) MCG/ACT IN AERS
2.0000 | INHALATION_SPRAY | Freq: Once | RESPIRATORY_TRACT | Status: AC
  Administered 2023-05-12: 2 via RESPIRATORY_TRACT
  Filled 2023-05-12: qty 6.7

## 2023-05-12 NOTE — Discharge Instructions (Addendum)
Thank you for allowing [place] to be a part of your care today.  Your workup is overall reassuring and looks stable from last time.  Your chest x-ray does look similar to the previous x-ray and has an opacity on the left side.  Please go to your CT as scheduled later this week.  You have been provided an inhaler to use as needed.  If you have chest tightness or shortness of breath, you may use 2 puffs every 4 hours.  Return to the ED if you develop sudden worsening of your symptoms or if you have new concerns.

## 2023-05-12 NOTE — ED Notes (Signed)
Patient transported to CT 

## 2023-05-12 NOTE — ED Triage Notes (Signed)
Pt c/o shob, hx of asthma. Reports difficulty sleeping last night. Using albuterol with mild improvement

## 2023-05-12 NOTE — ED Provider Notes (Signed)
[place] EMERGENCY DEPARTMENT AT [facility] Provider Note   CSN: [id number] Arrival date & time: 05/12/23  1337     History  Chief Complaint  Patient presents with   Shortness of Breath    [name] is a 87 y.o. male with past medical history significant for asthma, COPD, diabetes, hypertension, lung nodule presents to the ED complaining of shortness of breath.  Patient states he had difficulty sleeping last night.  Patient did use an albuterol inhaler with mild improvement in symptoms.  He states he currently feels okay and is not short of breath.  Denies fever, fatigue, cough, chest pain, leg swelling, palpitations.       Home Medications Prior to Admission medications   Medication Sig Start Date End Date Taking? Authorizing Provider  albuterol (PROVENTIL HFA;VENTOLIN HFA) 108 (90 BASE) MCG/ACT inhaler Inhale 2 puffs into the lungs every 6 (six) hours as needed for wheezing or shortness of breath.    [provider]  Ascorbic Acid (VITAMIN C PO) Take 1 tablet by mouth daily.    [provider]  aspirin 81 MG tablet Take 81 mg by mouth daily.    [provider]  cetirizine (ZYRTEC) 10 MG tablet Take 10 mg by mouth daily.    [provider]  famotidine (PEPCID) 20 MG tablet One at bedtime Patient taking differently: Take 20 mg by mouth at bedtime. 04/08/16   Nyoka Cowden, MD  finasteride (PROSCAR) 5 MG tablet Take 5 mg by mouth daily.    [provider]  folic acid (FOLVITE) 1 MG tablet Take 1 mg by mouth daily.    [provider]  gabapentin (NEURONTIN) 100 MG capsule Take 1 capsule (100 mg total) by mouth at bedtime. 02/04/22   Lomax, Amy, NP  glipiZIDE (GLUCOTROL XL) 10 MG 24 hr tablet Take 10 mg by mouth daily. 01/23/20   [provider]  losartan (COZAAR) 100 MG tablet Take 100 mg by mouth daily. 03/20/19   [provider]  metFORMIN (GLUCOPHAGE) 500 MG tablet Take 1 tablet (500 mg total)  by mouth 2 (two) times daily with a meal. Resume tonight. 09/09/14   Osvaldo Shipper, MD  methotrexate 2.5 MG tablet Take 2.5 mg by mouth once a week. Take 6 tablets orally once a week    [provider]  Multiple Vitamin (MULTIVITAMIN WITH MINERALS) TABS Take 1 tablet by mouth daily.    [provider]  omeprazole (PRILOSEC) 40 MG capsule Take 40 mg by mouth daily.    [provider]  ONE TOUCH ULTRA TEST test strip  09/28/15   [provider]  Dola Argyle LANCETS 33G MISC  09/28/15   [provider]  SYMBICORT 160-4.5 MCG/ACT inhaler INHALE 2 PUFFS INTO THE LUNGS 2 TIMES DAILY Patient taking differently: Inhale 2 puffs into the lungs 2 (two) times daily. 11/04/16   Nyoka Cowden, MD  Tamsulosin HCl (FLOMAX) 0.4 MG CAPS Take 0.4 mg by mouth daily.    [provider]      Allergies    Lisinopril, Other, Pneumococcal vac polyvalent, Prednisone, Prevnar 13 [pneumococcal 13-val conj vacc], Penicillins, and Sulfa antibiotics    Review of Systems   Review of Systems  Constitutional:  Negative for fatigue and fever.  Respiratory:  Positive for shortness of breath. Negative for cough.   Cardiovascular:  Negative for chest pain, palpitations and leg swelling.    Physical Exam Updated Vital Signs BP (!) 116/98   Pulse  83   Temp 98 F (36.7 C) (Oral)   Resp 14   Wt 90.7 kg   SpO2 97%   BMI 28.70 kg/m  Physical Exam Vitals and nursing note reviewed.  Constitutional:      General: He is not in acute distress.    Appearance: Normal appearance. He is not ill-appearing or diaphoretic.  Cardiovascular:     Rate and Rhythm: Normal rate and regular rhythm.     Pulses: Normal pulses.     Heart sounds: Normal heart sounds.  Pulmonary:     Effort: Pulmonary effort is normal. No tachypnea, accessory muscle usage or respiratory distress.     Breath sounds: Normal air entry. Examination of the left-lower field reveals decreased breath sounds.  Decreased breath sounds present. No wheezing or rales.     Comments: Patient is speaking in full sentences without difficulty.  No wheezing or rales appreciated.  There are mildly diminished breath sounds on the lower left with a pleural rub. Musculoskeletal:     Right lower leg: No edema.     Left lower leg: No edema.  Skin:    General: Skin is warm and dry.     Capillary Refill: Capillary refill takes less than 2 seconds.  Neurological:     Mental Status: He is alert. Mental status is at baseline.  Psychiatric:        Mood and Affect: Mood normal.        Behavior: Behavior normal.     ED Results / Procedures / Treatments   Labs (all labs ordered are listed, but only abnormal results are displayed) Labs Reviewed  BASIC METABOLIC PANEL - Abnormal; Notable for the following components:      Result Value   Sodium 127 (*)    Chloride 92 (*)    Glucose, Bld 145 (*)    Creatinine, Ser 0.46 (*)    All other components within normal limits  CBC WITH DIFFERENTIAL/PLATELET - Abnormal; Notable for the following components:   RBC 4.01 (*)    Hemoglobin 12.9 (*)    HCT 36.5 (*)    All other components within normal limits    EKG EKG Interpretation Date/Time:  Monday May 12 2023 13:48:12 EDT Ventricular Rate:  100 PR Interval:  166 QRS Duration:  86 QT Interval:  348 QTC Calculation: 448 R Axis:   63  Text Interpretation: Normal sinus rhythm Right atrial enlargement Possible Anterior infarct , age undetermined Abnormal ECG Confirmed by Vonita Moss [phone]) on 05/12/2023 1:50:24 PM  Radiology DG Chest 2 View  Result Date: 05/12/2023 CLINICAL DATA:  Shortness of breath. EXAM: CHEST - 2 VIEW COMPARISON:  April 24, 2023 FINDINGS: The heart size and mediastinal contours are within normal limits. There is moderate to marked severity calcification of the aortic arch. The lungs are hyperinflated. Chronic mild to moderate severity increased interstitial lung markings are noted.  Mild atelectasis and/or infiltrate is seen within the bilateral lung bases. No pleural effusion or pneumothorax is identified. Multiple chronic right-sided rib fractures are noted. Multilevel degenerative changes are seen throughout the thoracic spine. IMPRESSION: Chronic appearing increased interstitial lung markings with mild bibasilar atelectasis and/or infiltrate. Electronically Signed   By: Aram Candela M.D.   On: 05/12/2023 17:12    Procedures Procedures    Medications Ordered in ED Medications  iohexol (OMNIPAQUE) 300 MG/ML solution 75 mL (has no administration in time range)  albuterol (VENTOLIN HFA) 108 (90 Base) MCG/ACT inhaler 2 puff (2 puffs Inhalation Given  05/12/23 1646)    ED Course/ Medical Decision Making/ A&P                                 Medical Decision Making Amount and/or Complexity of Data Reviewed Labs: ordered. Radiology: ordered.  Risk Prescription drug management.   This patient presents to the ED with chief complaint(s) of shortness of breath with pertinent past medical history of COPD, asthma.  The complaint involves an extensive differential diagnosis and also carries with it a high risk of complications and morbidity.    The differential diagnosis includes pneumonia, COPD exacerbation, pleural effusion   The initial plan is to obtain labs, chest x-ray  Initial Assessment:   Exam significant for overall well-appearing patient who is not in acute distress.  He is speaking in full sentences.  Mildly diminished lung sounds in the left base with a pleural rub.  No wheezing or rales appreciated.  Skin is warm and dry without cyanosis.  Independent ECG/labs interpretation:  The following labs were independently interpreted:  CBC with mild anemia, no leukocytosis.  Metabolic panel with chronic hyponatremia.  Renal function is normal.  Independent visualization and interpretation of imaging: I independently visualized the following imaging with  scope of interpretation limited to determining acute life threatening conditions related to emergency care: Chest x-ray, which revealed chronic appearing increased interstitial lung markings with bibasilar atelectasis and/or infiltrate.  This is similar in appearance to previous x-ray done in September.  Offered to do CT chest for the patient as he does have a scheduled outpatient in a couple of days.  Patient states he would rather wait for his outpatient CT scan.  Treatment and Reassessment: Patient was given an albuterol inhaler with a spacer.  Disposition:   Advised patient to use his inhaler as needed if he has chest tightness or shortness of breath.  Patient's workup is overall reassuring.  There are chronic findings.  He overall appears well and is not short of breath even with ambulation.  Patient will have his outpatient CT done in a couple of days.  The patient has been appropriately medically screened and/or stabilized in the ED. I have low suspicion for any other emergent medical condition which would require further screening, evaluation or treatment in the ED or require inpatient management. At time of discharge the patient is hemodynamically stable and in no acute distress. I have discussed work-up results and diagnosis with patient and answered all questions. Patient is agreeable with discharge plan. We discussed strict return precautions for returning to the emergency department and they verbalized understanding.            Final Clinical Impression(s) / ED Diagnoses Final diagnoses:  Shortness of breath  History of COPD    Rx / DC Orders ED Discharge Orders     None         Lenard Simmer, PA-C 05/12/23 1749    Rondel Baton, MD 05/13/23 1358

## 2023-05-12 NOTE — Progress Notes (Signed)
RT assessed the Pt and he was clear and a little diminished in the lower lung. Pt stated his doctor scheduled him for a CAT Scan for Wednesday.

## 2023-05-14 ENCOUNTER — Ambulatory Visit
Admission: RE | Admit: 2023-05-14 | Discharge: 2023-05-14 | Disposition: A | Discharge status: Home / Self Care | Payer: PPO | Source: Ambulatory Visit | Attending: Family Medicine | Admitting: Family Medicine

## 2023-05-14 DIAGNOSIS — R918 Other nonspecific abnormal finding of lung field: Secondary | ICD-10-CM | POA: Diagnosis not present

## 2023-05-14 DIAGNOSIS — I517 Cardiomegaly: Secondary | ICD-10-CM | POA: Diagnosis not present

## 2023-05-14 DIAGNOSIS — I2510 Atherosclerotic heart disease of native coronary artery without angina pectoris: Secondary | ICD-10-CM | POA: Diagnosis not present

## 2023-05-14 DIAGNOSIS — R0602 Shortness of breath: Secondary | ICD-10-CM

## 2023-05-14 DIAGNOSIS — R0600 Dyspnea, unspecified: Secondary | ICD-10-CM | POA: Diagnosis not present

## 2023-05-14 MED ORDER — IOPAMIDOL (ISOVUE-370) INJECTION 76%
500.0000 mL | Freq: Once | INTRAVENOUS | Status: AC | PRN
  Administered 2023-05-14: 65 mL via INTRAVENOUS

## 2023-06-06 DIAGNOSIS — J988 Other specified respiratory disorders: Secondary | ICD-10-CM | POA: Diagnosis not present

## 2023-06-06 DIAGNOSIS — E1140 Type 2 diabetes mellitus with diabetic neuropathy, unspecified: Secondary | ICD-10-CM | POA: Diagnosis not present

## 2023-06-06 DIAGNOSIS — I10 Essential (primary) hypertension: Secondary | ICD-10-CM | POA: Diagnosis not present

## 2023-06-06 DIAGNOSIS — J45909 Unspecified asthma, uncomplicated: Secondary | ICD-10-CM | POA: Diagnosis not present

## 2023-06-06 DIAGNOSIS — E1142 Type 2 diabetes mellitus with diabetic polyneuropathy: Secondary | ICD-10-CM | POA: Diagnosis not present

## 2023-06-06 DIAGNOSIS — I700 Atherosclerosis of aorta: Secondary | ICD-10-CM | POA: Diagnosis not present

## 2023-06-23 DIAGNOSIS — R059 Cough, unspecified: Secondary | ICD-10-CM | POA: Diagnosis not present

## 2023-06-23 DIAGNOSIS — J45998 Other asthma: Secondary | ICD-10-CM | POA: Diagnosis not present

## 2023-06-23 DIAGNOSIS — J189 Pneumonia, unspecified organism: Secondary | ICD-10-CM | POA: Diagnosis not present

## 2023-07-03 DIAGNOSIS — R053 Chronic cough: Secondary | ICD-10-CM | POA: Diagnosis not present

## 2023-07-03 DIAGNOSIS — J449 Chronic obstructive pulmonary disease, unspecified: Secondary | ICD-10-CM | POA: Diagnosis not present

## 2023-07-03 DIAGNOSIS — J45909 Unspecified asthma, uncomplicated: Secondary | ICD-10-CM | POA: Diagnosis not present

## 2023-09-16 DIAGNOSIS — M19049 Primary osteoarthritis, unspecified hand: Secondary | ICD-10-CM | POA: Diagnosis not present

## 2023-09-16 DIAGNOSIS — Z79899 Other long term (current) drug therapy: Secondary | ICD-10-CM | POA: Diagnosis not present

## 2023-09-16 DIAGNOSIS — M1990 Unspecified osteoarthritis, unspecified site: Secondary | ICD-10-CM | POA: Diagnosis not present

## 2023-09-16 DIAGNOSIS — J449 Chronic obstructive pulmonary disease, unspecified: Secondary | ICD-10-CM | POA: Diagnosis not present

## 2023-09-16 DIAGNOSIS — G629 Polyneuropathy, unspecified: Secondary | ICD-10-CM | POA: Diagnosis not present

## 2023-09-16 DIAGNOSIS — M069 Rheumatoid arthritis, unspecified: Secondary | ICD-10-CM | POA: Diagnosis not present

## 2023-09-16 DIAGNOSIS — M79641 Pain in right hand: Secondary | ICD-10-CM | POA: Diagnosis not present

## 2023-09-16 DIAGNOSIS — E119 Type 2 diabetes mellitus without complications: Secondary | ICD-10-CM | POA: Diagnosis not present

## 2023-10-01 DIAGNOSIS — R053 Chronic cough: Secondary | ICD-10-CM | POA: Diagnosis not present

## 2023-10-01 DIAGNOSIS — J45909 Unspecified asthma, uncomplicated: Secondary | ICD-10-CM | POA: Diagnosis not present

## 2023-10-01 DIAGNOSIS — J449 Chronic obstructive pulmonary disease, unspecified: Secondary | ICD-10-CM | POA: Diagnosis not present

## 2023-10-13 DIAGNOSIS — R051 Acute cough: Secondary | ICD-10-CM | POA: Diagnosis not present

## 2023-10-13 DIAGNOSIS — R0609 Other forms of dyspnea: Secondary | ICD-10-CM | POA: Diagnosis not present

## 2023-10-13 DIAGNOSIS — J209 Acute bronchitis, unspecified: Secondary | ICD-10-CM | POA: Diagnosis not present

## 2023-11-01 DIAGNOSIS — J45909 Unspecified asthma, uncomplicated: Secondary | ICD-10-CM | POA: Diagnosis not present

## 2023-11-01 DIAGNOSIS — R053 Chronic cough: Secondary | ICD-10-CM | POA: Diagnosis not present

## 2023-11-01 DIAGNOSIS — J449 Chronic obstructive pulmonary disease, unspecified: Secondary | ICD-10-CM | POA: Diagnosis not present

## 2023-11-09 DIAGNOSIS — J4530 Mild persistent asthma, uncomplicated: Secondary | ICD-10-CM | POA: Diagnosis not present

## 2023-12-01 DIAGNOSIS — J449 Chronic obstructive pulmonary disease, unspecified: Secondary | ICD-10-CM | POA: Diagnosis not present

## 2023-12-01 DIAGNOSIS — R053 Chronic cough: Secondary | ICD-10-CM | POA: Diagnosis not present

## 2023-12-01 DIAGNOSIS — J45909 Unspecified asthma, uncomplicated: Secondary | ICD-10-CM | POA: Diagnosis not present

## 2023-12-15 DIAGNOSIS — E785 Hyperlipidemia, unspecified: Secondary | ICD-10-CM | POA: Diagnosis not present

## 2023-12-15 DIAGNOSIS — J988 Other specified respiratory disorders: Secondary | ICD-10-CM | POA: Diagnosis not present

## 2023-12-15 DIAGNOSIS — E1140 Type 2 diabetes mellitus with diabetic neuropathy, unspecified: Secondary | ICD-10-CM | POA: Diagnosis not present

## 2023-12-15 DIAGNOSIS — J45909 Unspecified asthma, uncomplicated: Secondary | ICD-10-CM | POA: Diagnosis not present

## 2023-12-15 DIAGNOSIS — Z0000 Encounter for general adult medical examination without abnormal findings: Secondary | ICD-10-CM | POA: Diagnosis not present

## 2023-12-15 DIAGNOSIS — E871 Hypo-osmolality and hyponatremia: Secondary | ICD-10-CM | POA: Diagnosis not present

## 2023-12-15 DIAGNOSIS — D649 Anemia, unspecified: Secondary | ICD-10-CM | POA: Diagnosis not present

## 2023-12-15 DIAGNOSIS — M069 Rheumatoid arthritis, unspecified: Secondary | ICD-10-CM | POA: Diagnosis not present

## 2023-12-15 DIAGNOSIS — M1990 Unspecified osteoarthritis, unspecified site: Secondary | ICD-10-CM | POA: Diagnosis not present

## 2023-12-15 DIAGNOSIS — Z79631 Long term (current) use of antimetabolite agent: Secondary | ICD-10-CM | POA: Diagnosis not present

## 2023-12-15 DIAGNOSIS — E11319 Type 2 diabetes mellitus with unspecified diabetic retinopathy without macular edema: Secondary | ICD-10-CM | POA: Diagnosis not present

## 2023-12-15 DIAGNOSIS — I10 Essential (primary) hypertension: Secondary | ICD-10-CM | POA: Diagnosis not present

## 2023-12-17 DIAGNOSIS — G629 Polyneuropathy, unspecified: Secondary | ICD-10-CM | POA: Diagnosis not present

## 2023-12-17 DIAGNOSIS — M19049 Primary osteoarthritis, unspecified hand: Secondary | ICD-10-CM | POA: Diagnosis not present

## 2023-12-17 DIAGNOSIS — E119 Type 2 diabetes mellitus without complications: Secondary | ICD-10-CM | POA: Diagnosis not present

## 2023-12-17 DIAGNOSIS — M1990 Unspecified osteoarthritis, unspecified site: Secondary | ICD-10-CM | POA: Diagnosis not present

## 2023-12-17 DIAGNOSIS — J449 Chronic obstructive pulmonary disease, unspecified: Secondary | ICD-10-CM | POA: Diagnosis not present

## 2023-12-17 DIAGNOSIS — Z79899 Other long term (current) drug therapy: Secondary | ICD-10-CM | POA: Diagnosis not present

## 2023-12-17 DIAGNOSIS — M069 Rheumatoid arthritis, unspecified: Secondary | ICD-10-CM | POA: Diagnosis not present

## 2024-01-01 DIAGNOSIS — J449 Chronic obstructive pulmonary disease, unspecified: Secondary | ICD-10-CM | POA: Diagnosis not present

## 2024-01-01 DIAGNOSIS — R053 Chronic cough: Secondary | ICD-10-CM | POA: Diagnosis not present

## 2024-01-01 DIAGNOSIS — J45909 Unspecified asthma, uncomplicated: Secondary | ICD-10-CM | POA: Diagnosis not present

## 2024-01-03 DIAGNOSIS — R059 Cough, unspecified: Secondary | ICD-10-CM | POA: Diagnosis not present

## 2024-01-03 DIAGNOSIS — J40 Bronchitis, not specified as acute or chronic: Secondary | ICD-10-CM | POA: Diagnosis not present

## 2024-01-03 DIAGNOSIS — Z20822 Contact with and (suspected) exposure to covid-19: Secondary | ICD-10-CM | POA: Diagnosis not present

## 2024-01-05 ENCOUNTER — Emergency Department (HOSPITAL_BASED_OUTPATIENT_CLINIC_OR_DEPARTMENT_OTHER)

## 2024-01-05 ENCOUNTER — Emergency Department (HOSPITAL_BASED_OUTPATIENT_CLINIC_OR_DEPARTMENT_OTHER)
Admission: EM | Admit: 2024-01-05 | Discharge: 2024-01-05 | Disposition: A | Discharge status: Home / Self Care | Attending: Emergency Medicine | Admitting: Emergency Medicine

## 2024-01-05 ENCOUNTER — Encounter (HOSPITAL_BASED_OUTPATIENT_CLINIC_OR_DEPARTMENT_OTHER): Payer: Self-pay

## 2024-01-05 ENCOUNTER — Other Ambulatory Visit: Payer: Self-pay

## 2024-01-05 ENCOUNTER — Ambulatory Visit: Admitting: Podiatry

## 2024-01-05 DIAGNOSIS — J949 Pleural condition, unspecified: Secondary | ICD-10-CM | POA: Diagnosis not present

## 2024-01-05 DIAGNOSIS — R55 Syncope and collapse: Secondary | ICD-10-CM | POA: Diagnosis not present

## 2024-01-05 DIAGNOSIS — U071 COVID-19: Secondary | ICD-10-CM | POA: Diagnosis not present

## 2024-01-05 DIAGNOSIS — J449 Chronic obstructive pulmonary disease, unspecified: Secondary | ICD-10-CM | POA: Diagnosis not present

## 2024-01-05 DIAGNOSIS — J441 Chronic obstructive pulmonary disease with (acute) exacerbation: Secondary | ICD-10-CM | POA: Insufficient documentation

## 2024-01-05 DIAGNOSIS — J9811 Atelectasis: Secondary | ICD-10-CM | POA: Diagnosis not present

## 2024-01-05 DIAGNOSIS — J40 Bronchitis, not specified as acute or chronic: Secondary | ICD-10-CM | POA: Diagnosis not present

## 2024-01-05 LAB — CBC WITH DIFFERENTIAL/PLATELET
Abs Immature Granulocytes: 0.02 10*3/uL (ref 0.00–0.07)
Basophils Absolute: 0 10*3/uL (ref 0.0–0.1)
Basophils Relative: 0 %
Eosinophils Absolute: 0 10*3/uL (ref 0.0–0.5)
Eosinophils Relative: 0 %
HCT: 36.5 % — ABNORMAL LOW (ref 39.0–52.0)
Hemoglobin: 13 g/dL (ref 13.0–17.0)
Immature Granulocytes: 0 %
Lymphocytes Relative: 13 %
Lymphs Abs: 0.9 10*3/uL (ref 0.7–4.0)
MCH: 32.6 pg (ref 26.0–34.0)
MCHC: 35.6 g/dL (ref 30.0–36.0)
MCV: 91.5 fL (ref 80.0–100.0)
Monocytes Absolute: 0.6 10*3/uL (ref 0.1–1.0)
Monocytes Relative: 8 %
Neutro Abs: 5.8 10*3/uL (ref 1.7–7.7)
Neutrophils Relative %: 79 %
Platelets: 282 10*3/uL (ref 150–400)
RBC: 3.99 MIL/uL — ABNORMAL LOW (ref 4.22–5.81)
RDW: 14.6 % (ref 11.5–15.5)
WBC: 7.3 10*3/uL (ref 4.0–10.5)
nRBC: 0 % (ref 0.0–0.2)

## 2024-01-05 LAB — TROPONIN T, HIGH SENSITIVITY: Troponin T High Sensitivity: <15 ng/L (ref <19)

## 2024-01-05 LAB — BASIC METABOLIC PANEL WITH GFR
Anion gap: 15 (ref 5–15)
BUN: 16 mg/dL (ref 8–23)
CO2: 23 mmol/L (ref 22–32)
Calcium: 9.9 mg/dL (ref 8.9–10.3)
Chloride: 90 mmol/L — ABNORMAL LOW (ref 98–111)
Creatinine, Ser: 0.62 mg/dL (ref 0.61–1.24)
GFR, Estimated: >60 mL/min (ref >60)
Glucose, Bld: 164 mg/dL — ABNORMAL HIGH (ref 70–99)
Potassium: 4.7 mmol/L (ref 3.5–5.1)
Sodium: 129 mmol/L — ABNORMAL LOW (ref 135–145)

## 2024-01-05 MED ORDER — METHYLPREDNISOLONE SODIUM SUCC 125 MG IJ SOLR
125.0000 mg | Freq: Once | INTRAMUSCULAR | Status: AC
  Administered 2024-01-05: 125 mg via INTRAVENOUS
  Filled 2024-01-05: qty 2

## 2024-01-05 MED ORDER — IPRATROPIUM-ALBUTEROL 0.5-2.5 (3) MG/3ML IN SOLN: 3.0000 mL | RESPIRATORY_TRACT | 0 refills | Status: AC | PRN

## 2024-01-05 MED ORDER — IPRATROPIUM-ALBUTEROL 0.5-2.5 (3) MG/3ML IN SOLN
3.0000 mL | Freq: Once | RESPIRATORY_TRACT | Status: AC
  Administered 2024-01-05: 3 mL via RESPIRATORY_TRACT
  Filled 2024-01-05: qty 3

## 2024-01-05 NOTE — ED Triage Notes (Addendum)
 Pt states he has had difficulty sleeping this week.  Went to UC yesterday dx with Bronchitis, Covid -, breathing has improved.  Also has been emotional this week r/t his spouse is in the hospital.  States he became dizzy after drinking his scotch last evening

## 2024-01-05 NOTE — ED Provider Notes (Signed)
 [place] EMERGENCY DEPARTMENT AT [facility] Provider Note   CSN: [id number] Arrival date & time: 01/05/24  0356     History Chief Complaint  Patient presents with   Near Syncope    HPI [name] is a 88 y.o. male presenting for a chief complaint of some difficulty sleeping and breathing concerns. Son states that the patient is very overwhelmed with a significant other who is very sick right now. Patient states that he was trying to sleep in a rocking chair and he had sudden onset feeling like he was going to pass out. He is not been sleeping in bed because his wife is in the hospital and he does not like sleeping and they are without her.  Family states he has not slept very well in the last 2 weeks.  Patient's recorded medical, surgical, social, medication list and allergies were reviewed in the Snapshot window as part of the initial history.   Review of Systems   Review of Systems  Constitutional:  Negative for chills and fever.  HENT:  Negative for ear pain and sore throat.   Eyes:  Negative for pain and visual disturbance.  Respiratory:  Negative for cough and shortness of breath.   Cardiovascular:  Negative for chest pain and palpitations.  Gastrointestinal:  Negative for abdominal pain and vomiting.  Genitourinary:  Negative for dysuria and hematuria.  Musculoskeletal:  Negative for arthralgias and back pain.  Skin:  Negative for color change and rash.  Neurological:  Positive for light-headedness. Negative for seizures and syncope.  All other systems reviewed and are negative.   Physical Exam Updated Vital Signs BP 139/78   Pulse 81   Resp 20   Ht 5' 10 (1.778 m)   Wt 87.1 kg   SpO2 94%   BMI 27.55 kg/m  Physical Exam Vitals and nursing note reviewed.  Constitutional:      General: He is not in acute distress.    Appearance: He is well-developed.  HENT:     Head: Normocephalic and atraumatic.   Eyes:     Conjunctiva/sclera: Conjunctivae  normal.    Cardiovascular:     Rate and Rhythm: Normal rate and regular rhythm.     Heart sounds: No murmur heard. Pulmonary:     Effort: Pulmonary effort is normal. No respiratory distress.     Breath sounds: Normal breath sounds.  Abdominal:     Palpations: Abdomen is soft.     Tenderness: There is no abdominal tenderness.   Musculoskeletal:        General: No swelling.     Cervical back: Neck supple.   Skin:    General: Skin is warm and dry.     Capillary Refill: Capillary refill takes less than 2 seconds.   Neurological:     Mental Status: He is alert.   Psychiatric:        Mood and Affect: Mood normal.      ED Course/ Medical Decision Making/ A&P    Procedures Procedures   Medications Ordered in ED Medications  methylPREDNISolone  sodium succinate (SOLU-MEDROL ) 125 mg/2 mL injection 125 mg (125 mg Intravenous Given 01/05/24 0426)  ipratropium-albuterol  (DUONEB) 0.5-2.5 (3) MG/3ML nebulizer solution 3 mL (3 mLs Nebulization Given 01/05/24 0420)   Medical Decision Making:   [name] is a 88 y.o. male who presented to the ED today with a near-syncopal episode detailed above.    Patient placed on continuous vitals and telemetry monitoring while in ED which was  reviewed periodically.  Complete initial physical exam performed, notably the patient  was HDS in NAD.    Reviewed and confirmed nursing documentation for past medical history, family history, social history.    Initial Assessment:   With the patient's presentation of near syncope, most likely diagnosis is orthostatic hypotension vs vasovagal episode. Other diagnoses were considered including (but not limited to) arrythmogenic syncope, valvular abnormality, PE, aortic dissection. These are considered less likely due to history of present illness and physical exam findings.   This is most consistent with an acute life/limb threatening illness complicated by underlying chronic conditions. In particular,  concerning cardiac etiology, this is less likely to be the etiology given the lack of chest pain, lack of serious comorbidities including heart failure or CAD.   Additionally, patient's history appears more consistent with benign episodes including orthostatic event vs  vagal episode based on HPI.  Initial Plan:  Screening labs including CBC and Metabolic panel to evaluate for infectious or metabolic etiology of disease.  Urinalysis with reflex culture ordered to evaluate for UTI or relevant urologic/nephrologic pathology.  CXR to evaluate for structural/infectious intrathoracic pathology.  EKG and single troponin to evaluate for cardiac pathology. Single troponin appropriate due to greater than 6 hours since maximal intensity of symptoms. Objective evaluation as below reviewed after administration of IVF/Telemetry monitoring  Initial Study Results:   Laboratory  All laboratory results reviewed without evidence of clinically relevant pathology.    EKG EKG was reviewed independently. Rate, rhythm, axis, intervals all examined and without medically relevant abnormality. ST segments without concerns for elevations.    Radiology:  All images reviewed independently.  Agree with radiology report at this time.   DG Chest Portable 1 View Result Date: 01/05/2024 EXAM: 1 VIEW XRAY OF THE CHEST 01/05/2024 04:40:00 AM COMPARISON: 2-view chest x-ray 10/13/2023. CLINICAL HISTORY: Pt states he has had difficulty sleeping this week. Went to UC yesterday dx with Bronchitis, Covid -, breathing has improved. Also has been emotional this week r/t his spouse is in the hospital. States he became dizzy after drinking his scotch last evening. FINDINGS: LUNGS AND PLEURA: Changes of COPD are again noted. Mild atelectasis is present at both lung bases. No focal pulmonary opacity. No pulmonary edema. No pleural effusion. No pneumothorax. HEART AND MEDIASTINUM: Atherosclerotic changes are present at the aortic arch. No  acute abnormality of the cardiac and mediastinal silhouettes. BONES AND SOFT TISSUES: No acute osseous abnormality. IMPRESSION: 1. No acute findings. 2. Changes of COPD and mild atelectasis at both lung bases. Electronically signed by: Lonni Necessary MD 01/05/2024 04:53 AM EDT RP Workstation: [id number]        Final Assessment and Plan:   No acute pathology on lab work.  Observed for 2 hours with no syndrome.  Family and patient felt comfort with outpatient care management. Mild COPD exacerbation he is already on outpatient steroids.  Treated with DuoNebs here with improvement and with refills of the cartridges to pharmacy.  Disposition:  I have considered need for hospitalization, however, considering all of the above, I believe this patient is stable for discharge at this time.  Patient/family educated about specific return precautions for given chief complaint and symptoms.  Patient/family educated about follow-up with PCP.     Patient/family expressed understanding of return precautions and need for follow-up. Patient spoken to regarding all imaging and laboratory results and appropriate follow up for these results. All education provided in verbal form with additional information in written form. Time was allowed  for answering of patient questions. Patient discharged.    Emergency Department Medication Summary:   Medications  methylPREDNISolone  sodium succinate (SOLU-MEDROL ) 125 mg/2 mL injection 125 mg (125 mg Intravenous Given 01/05/24 0426)  ipratropium-albuterol  (DUONEB) 0.5-2.5 (3) MG/3ML nebulizer solution 3 mL (3 mLs Nebulization Given 01/05/24 0420)          Clinical Impression:  1. Bronchitis      Discharge   Clinical Impression:  1. Bronchitis      Discharge   Final Clinical Impression(s) / ED Diagnoses Final diagnoses:  Bronchitis    Rx / DC Orders ED Discharge Orders          Ordered    ipratropium-albuterol  (DUONEB) 0.5-2.5 (3) MG/3ML SOLN  Every 4  hours PRN        01/05/24 0550              Jerral Meth, MD 01/05/24 [phone]

## 2024-01-25 DIAGNOSIS — M069 Rheumatoid arthritis, unspecified: Secondary | ICD-10-CM | POA: Diagnosis not present

## 2024-01-25 DIAGNOSIS — R911 Solitary pulmonary nodule: Secondary | ICD-10-CM | POA: Diagnosis not present

## 2024-01-25 DIAGNOSIS — J449 Chronic obstructive pulmonary disease, unspecified: Secondary | ICD-10-CM | POA: Diagnosis not present

## 2024-01-25 DIAGNOSIS — E1159 Type 2 diabetes mellitus with other circulatory complications: Secondary | ICD-10-CM | POA: Diagnosis not present

## 2024-01-31 DIAGNOSIS — J449 Chronic obstructive pulmonary disease, unspecified: Secondary | ICD-10-CM | POA: Diagnosis not present

## 2024-01-31 DIAGNOSIS — R053 Chronic cough: Secondary | ICD-10-CM | POA: Diagnosis not present

## 2024-01-31 DIAGNOSIS — J45909 Unspecified asthma, uncomplicated: Secondary | ICD-10-CM | POA: Diagnosis not present

## 2024-03-02 DIAGNOSIS — J45909 Unspecified asthma, uncomplicated: Secondary | ICD-10-CM | POA: Diagnosis not present

## 2024-03-02 DIAGNOSIS — J449 Chronic obstructive pulmonary disease, unspecified: Secondary | ICD-10-CM | POA: Diagnosis not present

## 2024-03-02 DIAGNOSIS — R053 Chronic cough: Secondary | ICD-10-CM | POA: Diagnosis not present

## 2024-03-07 DIAGNOSIS — E559 Vitamin D deficiency, unspecified: Secondary | ICD-10-CM | POA: Diagnosis not present

## 2024-03-07 DIAGNOSIS — K219 Gastro-esophageal reflux disease without esophagitis: Secondary | ICD-10-CM | POA: Diagnosis not present

## 2024-03-07 DIAGNOSIS — E1159 Type 2 diabetes mellitus with other circulatory complications: Secondary | ICD-10-CM | POA: Diagnosis not present

## 2024-03-07 DIAGNOSIS — E1140 Type 2 diabetes mellitus with diabetic neuropathy, unspecified: Secondary | ICD-10-CM | POA: Diagnosis not present

## 2024-03-25 DIAGNOSIS — E871 Hypo-osmolality and hyponatremia: Secondary | ICD-10-CM | POA: Diagnosis not present

## 2024-03-25 DIAGNOSIS — G629 Polyneuropathy, unspecified: Secondary | ICD-10-CM | POA: Diagnosis not present

## 2024-03-25 DIAGNOSIS — J449 Chronic obstructive pulmonary disease, unspecified: Secondary | ICD-10-CM | POA: Diagnosis not present

## 2024-03-25 DIAGNOSIS — Z79899 Other long term (current) drug therapy: Secondary | ICD-10-CM | POA: Diagnosis not present

## 2024-03-25 DIAGNOSIS — M1990 Unspecified osteoarthritis, unspecified site: Secondary | ICD-10-CM | POA: Diagnosis not present

## 2024-03-25 DIAGNOSIS — M069 Rheumatoid arthritis, unspecified: Secondary | ICD-10-CM | POA: Diagnosis not present

## 2024-04-02 DIAGNOSIS — J449 Chronic obstructive pulmonary disease, unspecified: Secondary | ICD-10-CM | POA: Diagnosis not present

## 2024-04-02 DIAGNOSIS — J45909 Unspecified asthma, uncomplicated: Secondary | ICD-10-CM | POA: Diagnosis not present

## 2024-04-02 DIAGNOSIS — R053 Chronic cough: Secondary | ICD-10-CM | POA: Diagnosis not present

## 2024-04-16 DIAGNOSIS — M6281 Muscle weakness (generalized): Secondary | ICD-10-CM | POA: Diagnosis not present

## 2024-04-16 DIAGNOSIS — M069 Rheumatoid arthritis, unspecified: Secondary | ICD-10-CM | POA: Diagnosis not present

## 2024-04-16 DIAGNOSIS — G629 Polyneuropathy, unspecified: Secondary | ICD-10-CM | POA: Diagnosis not present

## 2024-04-19 DIAGNOSIS — M6281 Muscle weakness (generalized): Secondary | ICD-10-CM | POA: Diagnosis not present

## 2024-04-19 DIAGNOSIS — G629 Polyneuropathy, unspecified: Secondary | ICD-10-CM | POA: Diagnosis not present

## 2024-04-19 DIAGNOSIS — M069 Rheumatoid arthritis, unspecified: Secondary | ICD-10-CM | POA: Diagnosis not present

## 2024-04-21 DIAGNOSIS — G629 Polyneuropathy, unspecified: Secondary | ICD-10-CM | POA: Diagnosis not present

## 2024-04-21 DIAGNOSIS — M6281 Muscle weakness (generalized): Secondary | ICD-10-CM | POA: Diagnosis not present

## 2024-04-21 DIAGNOSIS — M069 Rheumatoid arthritis, unspecified: Secondary | ICD-10-CM | POA: Diagnosis not present

## 2024-04-26 DIAGNOSIS — M6281 Muscle weakness (generalized): Secondary | ICD-10-CM | POA: Diagnosis not present

## 2024-04-26 DIAGNOSIS — G629 Polyneuropathy, unspecified: Secondary | ICD-10-CM | POA: Diagnosis not present

## 2024-04-26 DIAGNOSIS — M069 Rheumatoid arthritis, unspecified: Secondary | ICD-10-CM | POA: Diagnosis not present

## 2024-04-28 DIAGNOSIS — G629 Polyneuropathy, unspecified: Secondary | ICD-10-CM | POA: Diagnosis not present

## 2024-04-28 DIAGNOSIS — M069 Rheumatoid arthritis, unspecified: Secondary | ICD-10-CM | POA: Diagnosis not present

## 2024-04-28 DIAGNOSIS — M6281 Muscle weakness (generalized): Secondary | ICD-10-CM | POA: Diagnosis not present

## 2024-04-29 DIAGNOSIS — R2689 Other abnormalities of gait and mobility: Secondary | ICD-10-CM | POA: Diagnosis not present

## 2024-04-29 DIAGNOSIS — M6281 Muscle weakness (generalized): Secondary | ICD-10-CM | POA: Diagnosis not present

## 2024-04-30 DIAGNOSIS — M069 Rheumatoid arthritis, unspecified: Secondary | ICD-10-CM | POA: Diagnosis not present

## 2024-04-30 DIAGNOSIS — M6281 Muscle weakness (generalized): Secondary | ICD-10-CM | POA: Diagnosis not present

## 2024-04-30 DIAGNOSIS — G629 Polyneuropathy, unspecified: Secondary | ICD-10-CM | POA: Diagnosis not present

## 2024-05-02 DIAGNOSIS — J45909 Unspecified asthma, uncomplicated: Secondary | ICD-10-CM | POA: Diagnosis not present

## 2024-05-02 DIAGNOSIS — R053 Chronic cough: Secondary | ICD-10-CM | POA: Diagnosis not present

## 2024-05-03 DIAGNOSIS — G629 Polyneuropathy, unspecified: Secondary | ICD-10-CM | POA: Diagnosis not present

## 2024-05-03 DIAGNOSIS — M069 Rheumatoid arthritis, unspecified: Secondary | ICD-10-CM | POA: Diagnosis not present

## 2024-05-03 DIAGNOSIS — M6281 Muscle weakness (generalized): Secondary | ICD-10-CM | POA: Diagnosis not present

## 2024-05-04 DIAGNOSIS — R2689 Other abnormalities of gait and mobility: Secondary | ICD-10-CM | POA: Diagnosis not present

## 2024-05-04 DIAGNOSIS — M6281 Muscle weakness (generalized): Secondary | ICD-10-CM | POA: Diagnosis not present

## 2024-05-05 DIAGNOSIS — M6281 Muscle weakness (generalized): Secondary | ICD-10-CM | POA: Diagnosis not present

## 2024-05-05 DIAGNOSIS — M069 Rheumatoid arthritis, unspecified: Secondary | ICD-10-CM | POA: Diagnosis not present

## 2024-05-05 DIAGNOSIS — G629 Polyneuropathy, unspecified: Secondary | ICD-10-CM | POA: Diagnosis not present

## 2024-05-10 DIAGNOSIS — G629 Polyneuropathy, unspecified: Secondary | ICD-10-CM | POA: Diagnosis not present

## 2024-05-10 DIAGNOSIS — M069 Rheumatoid arthritis, unspecified: Secondary | ICD-10-CM | POA: Diagnosis not present

## 2024-05-10 DIAGNOSIS — M6281 Muscle weakness (generalized): Secondary | ICD-10-CM | POA: Diagnosis not present

## 2024-05-11 DIAGNOSIS — R2689 Other abnormalities of gait and mobility: Secondary | ICD-10-CM | POA: Diagnosis not present

## 2024-05-11 DIAGNOSIS — M6281 Muscle weakness (generalized): Secondary | ICD-10-CM | POA: Diagnosis not present

## 2024-05-14 ENCOUNTER — Encounter (HOSPITAL_COMMUNITY): Payer: Self-pay

## 2024-05-14 ENCOUNTER — Other Ambulatory Visit: Payer: Self-pay

## 2024-05-14 ENCOUNTER — Inpatient Hospital Stay (HOSPITAL_COMMUNITY)

## 2024-05-14 ENCOUNTER — Inpatient Hospital Stay (HOSPITAL_COMMUNITY)
Admission: EM | Admit: 2024-05-14 | Discharge: 2024-05-17 | DRG: 871 | Disposition: A | Discharge status: Home Health | Source: Skilled Nursing Facility | Attending: Internal Medicine | Admitting: Internal Medicine

## 2024-05-14 ENCOUNTER — Emergency Department (HOSPITAL_COMMUNITY)

## 2024-05-14 DIAGNOSIS — J440 Chronic obstructive pulmonary disease with acute lower respiratory infection: Secondary | ICD-10-CM | POA: Diagnosis present

## 2024-05-14 DIAGNOSIS — Z7982 Long term (current) use of aspirin: Secondary | ICD-10-CM

## 2024-05-14 DIAGNOSIS — F05 Delirium due to known physiological condition: Secondary | ICD-10-CM | POA: Diagnosis present

## 2024-05-14 DIAGNOSIS — Z823 Family history of stroke: Secondary | ICD-10-CM | POA: Diagnosis not present

## 2024-05-14 DIAGNOSIS — M6281 Muscle weakness (generalized): Secondary | ICD-10-CM | POA: Diagnosis not present

## 2024-05-14 DIAGNOSIS — R918 Other nonspecific abnormal finding of lung field: Secondary | ICD-10-CM | POA: Diagnosis not present

## 2024-05-14 DIAGNOSIS — J918 Pleural effusion in other conditions classified elsewhere: Secondary | ICD-10-CM | POA: Diagnosis present

## 2024-05-14 DIAGNOSIS — J9811 Atelectasis: Secondary | ICD-10-CM | POA: Diagnosis not present

## 2024-05-14 DIAGNOSIS — Z880 Allergy status to penicillin: Secondary | ICD-10-CM

## 2024-05-14 DIAGNOSIS — E1142 Type 2 diabetes mellitus with diabetic polyneuropathy: Secondary | ICD-10-CM | POA: Diagnosis present

## 2024-05-14 DIAGNOSIS — E871 Hypo-osmolality and hyponatremia: Secondary | ICD-10-CM | POA: Diagnosis present

## 2024-05-14 DIAGNOSIS — W06XXXA Fall from bed, initial encounter: Secondary | ICD-10-CM | POA: Diagnosis present

## 2024-05-14 DIAGNOSIS — A419 Sepsis, unspecified organism: Secondary | ICD-10-CM | POA: Diagnosis present

## 2024-05-14 DIAGNOSIS — Z4682 Encounter for fitting and adjustment of non-vascular catheter: Secondary | ICD-10-CM | POA: Diagnosis not present

## 2024-05-14 DIAGNOSIS — M069 Rheumatoid arthritis, unspecified: Secondary | ICD-10-CM | POA: Diagnosis not present

## 2024-05-14 DIAGNOSIS — F1010 Alcohol abuse, uncomplicated: Secondary | ICD-10-CM | POA: Diagnosis present

## 2024-05-14 DIAGNOSIS — J939 Pneumothorax, unspecified: Secondary | ICD-10-CM | POA: Diagnosis not present

## 2024-05-14 DIAGNOSIS — Y92009 Unspecified place in unspecified non-institutional (private) residence as the place of occurrence of the external cause: Secondary | ICD-10-CM

## 2024-05-14 DIAGNOSIS — R079 Chest pain, unspecified: Secondary | ICD-10-CM | POA: Diagnosis not present

## 2024-05-14 DIAGNOSIS — Z1152 Encounter for screening for COVID-19: Secondary | ICD-10-CM

## 2024-05-14 DIAGNOSIS — G629 Polyneuropathy, unspecified: Secondary | ICD-10-CM | POA: Diagnosis not present

## 2024-05-14 DIAGNOSIS — Z87891 Personal history of nicotine dependence: Secondary | ICD-10-CM

## 2024-05-14 DIAGNOSIS — Z7984 Long term (current) use of oral hypoglycemic drugs: Secondary | ICD-10-CM

## 2024-05-14 DIAGNOSIS — J9601 Acute respiratory failure with hypoxia: Secondary | ICD-10-CM | POA: Diagnosis not present

## 2024-05-14 DIAGNOSIS — E1165 Type 2 diabetes mellitus with hyperglycemia: Secondary | ICD-10-CM | POA: Diagnosis present

## 2024-05-14 DIAGNOSIS — J90 Pleural effusion, not elsewhere classified: Secondary | ICD-10-CM | POA: Diagnosis not present

## 2024-05-14 DIAGNOSIS — I700 Atherosclerosis of aorta: Secondary | ICD-10-CM | POA: Diagnosis not present

## 2024-05-14 DIAGNOSIS — N400 Enlarged prostate without lower urinary tract symptoms: Secondary | ICD-10-CM | POA: Diagnosis present

## 2024-05-14 DIAGNOSIS — Y92003 Bedroom of unspecified non-institutional (private) residence as the place of occurrence of the external cause: Secondary | ICD-10-CM

## 2024-05-14 DIAGNOSIS — I10 Essential (primary) hypertension: Secondary | ICD-10-CM | POA: Diagnosis present

## 2024-05-14 DIAGNOSIS — Z882 Allergy status to sulfonamides status: Secondary | ICD-10-CM | POA: Diagnosis not present

## 2024-05-14 DIAGNOSIS — Z7951 Long term (current) use of inhaled steroids: Secondary | ICD-10-CM

## 2024-05-14 DIAGNOSIS — W19XXXA Unspecified fall, initial encounter: Secondary | ICD-10-CM

## 2024-05-14 DIAGNOSIS — R231 Pallor: Secondary | ICD-10-CM | POA: Diagnosis not present

## 2024-05-14 DIAGNOSIS — K219 Gastro-esophageal reflux disease without esophagitis: Secondary | ICD-10-CM | POA: Diagnosis present

## 2024-05-14 DIAGNOSIS — R062 Wheezing: Secondary | ICD-10-CM | POA: Diagnosis not present

## 2024-05-14 DIAGNOSIS — E118 Type 2 diabetes mellitus with unspecified complications: Secondary | ICD-10-CM | POA: Diagnosis present

## 2024-05-14 DIAGNOSIS — E7800 Pure hypercholesterolemia, unspecified: Secondary | ICD-10-CM | POA: Diagnosis present

## 2024-05-14 DIAGNOSIS — T380X5A Adverse effect of glucocorticoids and synthetic analogues, initial encounter: Secondary | ICD-10-CM | POA: Diagnosis present

## 2024-05-14 DIAGNOSIS — R0602 Shortness of breath: Secondary | ICD-10-CM | POA: Diagnosis not present

## 2024-05-14 DIAGNOSIS — J189 Pneumonia, unspecified organism: Principal | ICD-10-CM | POA: Diagnosis present

## 2024-05-14 DIAGNOSIS — Z792 Long term (current) use of antibiotics: Secondary | ICD-10-CM

## 2024-05-14 LAB — CBC WITH DIFFERENTIAL/PLATELET
Abs Immature Granulocytes: 0.07 K/uL (ref 0.00–0.07)
Basophils Absolute: 0 K/uL (ref 0.0–0.1)
Basophils Relative: 0 %
Eosinophils Absolute: 0 K/uL (ref 0.0–0.5)
Eosinophils Relative: 0 %
HCT: 34.2 % — ABNORMAL LOW (ref 39.0–52.0)
Hemoglobin: 11.7 g/dL — ABNORMAL LOW (ref 13.0–17.0)
Immature Granulocytes: 0 %
Lymphocytes Relative: 3 %
Lymphs Abs: 0.6 K/uL — ABNORMAL LOW (ref 0.7–4.0)
MCH: 31 pg (ref 26.0–34.0)
MCHC: 34.2 g/dL (ref 30.0–36.0)
MCV: 90.5 fL (ref 80.0–100.0)
Monocytes Absolute: 1.1 K/uL — ABNORMAL HIGH (ref 0.1–1.0)
Monocytes Relative: 6 %
Neutro Abs: 16.8 K/uL — ABNORMAL HIGH (ref 1.7–7.7)
Neutrophils Relative %: 91 %
Platelets: 313 K/uL (ref 150–400)
RBC: 3.78 MIL/uL — ABNORMAL LOW (ref 4.22–5.81)
RDW: 14.5 % (ref 11.5–15.5)
WBC: 18.6 K/uL — ABNORMAL HIGH (ref 4.0–10.5)
nRBC: 0 % (ref 0.0–0.2)

## 2024-05-14 LAB — BRAIN NATRIURETIC PEPTIDE: B Natriuretic Peptide: 68.9 pg/mL (ref 0.0–100.0)

## 2024-05-14 LAB — FOLATE: Folate: 15.1 ng/mL (ref >5.9)

## 2024-05-14 LAB — RESP PANEL BY RT-PCR (RSV, FLU A&B, COVID)  RVPGX2
Influenza A by PCR: NEGATIVE
Influenza B by PCR: NEGATIVE
Resp Syncytial Virus by PCR: NEGATIVE
SARS Coronavirus 2 by RT PCR: NEGATIVE

## 2024-05-14 LAB — IRON AND TIBC
Iron: 39 ug/dL — ABNORMAL LOW (ref 45–182)
Saturation Ratios: 13 % — ABNORMAL LOW (ref 17.9–39.5)
TIBC: 295 ug/dL (ref 250–450)
UIBC: 256 ug/dL

## 2024-05-14 LAB — COMPREHENSIVE METABOLIC PANEL WITH GFR
ALT: 20 U/L (ref 0–44)
AST: 22 U/L (ref 15–41)
Albumin: 3.2 g/dL — ABNORMAL LOW (ref 3.5–5.0)
Alkaline Phosphatase: 70 U/L (ref 38–126)
Anion gap: 15 (ref 5–15)
BUN: 12 mg/dL (ref 8–23)
CO2: 21 mmol/L — ABNORMAL LOW (ref 22–32)
Calcium: 8.5 mg/dL — ABNORMAL LOW (ref 8.9–10.3)
Chloride: 86 mmol/L — ABNORMAL LOW (ref 98–111)
Creatinine, Ser: 0.62 mg/dL (ref 0.61–1.24)
GFR, Estimated: >60 mL/min (ref >60)
Glucose, Bld: 243 mg/dL — ABNORMAL HIGH (ref 70–99)
Potassium: 4.5 mmol/L (ref 3.5–5.1)
Sodium: 122 mmol/L — ABNORMAL LOW (ref 135–145)
Total Bilirubin: 1.2 mg/dL (ref 0.0–1.2)
Total Protein: 6.5 g/dL (ref 6.5–8.1)

## 2024-05-14 LAB — TYPE AND SCREEN
ABO/RH(D): A POS
Antibody Screen: NEGATIVE

## 2024-05-14 LAB — RETICULOCYTES
Immature Retic Fract: 13.9 % (ref 2.3–15.9)
RBC.: 3.89 MIL/uL — ABNORMAL LOW (ref 4.22–5.81)
Retic Count, Absolute: 77 K/uL (ref 19.0–186.0)
Retic Ct Pct: 2 % (ref 0.4–3.1)

## 2024-05-14 LAB — FERRITIN: Ferritin: 80 ng/mL (ref 24–336)

## 2024-05-14 LAB — ABO/RH: ABO/RH(D): A POS

## 2024-05-14 LAB — OSMOLALITY: Osmolality: 274 mosm/kg — ABNORMAL LOW (ref 275–295)

## 2024-05-14 LAB — I-STAT CG4 LACTIC ACID, ED: Lactic Acid, Venous: 1.7 mmol/L (ref 0.5–1.9)

## 2024-05-14 LAB — ETHANOL: Alcohol, Ethyl (B): <15 mg/dL (ref <15)

## 2024-05-14 LAB — T4, FREE: Free T4: 0.76 ng/dL (ref 0.61–1.12)

## 2024-05-14 LAB — PROCALCITONIN: Procalcitonin: 3.63 ng/mL

## 2024-05-14 LAB — TSH: TSH: 0.874 u[IU]/mL (ref 0.350–4.500)

## 2024-05-14 LAB — VITAMIN B12: Vitamin B-12: 862 pg/mL (ref 180–914)

## 2024-05-14 MED ORDER — GUAIFENESIN ER 600 MG PO TB12: 600.0000 mg | ORAL_TABLET | Freq: Two times a day (BID) | ORAL | Status: DC | PRN

## 2024-05-14 MED ORDER — FOLIC ACID 1 MG PO TABS
1.0000 mg | ORAL_TABLET | Freq: Every day | ORAL | Status: DC
  Administered 2024-05-14 – 2024-05-17 (×4): 1 mg via ORAL
  Filled 2024-05-14 (×4): qty 1

## 2024-05-14 MED ORDER — SODIUM CHLORIDE 0.9 % IV SOLN: 1.0000 g | INTRAVENOUS | Status: DC

## 2024-05-14 MED ORDER — LORAZEPAM 1 MG PO TABS: 1.0000 mg | ORAL_TABLET | ORAL | Status: DC | PRN

## 2024-05-14 MED ORDER — METHYLPREDNISOLONE SODIUM SUCC 125 MG IJ SOLR
120.0000 mg | INTRAMUSCULAR | Status: AC
  Administered 2024-05-14: 120 mg via INTRAVENOUS
  Filled 2024-05-14: qty 2

## 2024-05-14 MED ORDER — THIAMINE HCL 100 MG/ML IJ SOLN
500.0000 mg | Freq: Once | INTRAMUSCULAR | Status: AC
  Administered 2024-05-14: 500 mg via INTRAVENOUS
  Filled 2024-05-14: qty 5

## 2024-05-14 MED ORDER — THIAMINE HCL 100 MG/ML IJ SOLN
100.0000 mg | Freq: Every day | INTRAMUSCULAR | Status: DC
  Filled 2024-05-14 (×3): qty 2

## 2024-05-14 MED ORDER — HYDRALAZINE HCL 20 MG/ML IJ SOLN: 5.0000 mg | INTRAMUSCULAR | Status: DC | PRN

## 2024-05-14 MED ORDER — IOHEXOL 350 MG/ML SOLN
75.0000 mL | Freq: Once | INTRAVENOUS | Status: AC | PRN
Start: 2024-05-14 — End: 2024-05-14
  Administered 2024-05-14: 75 mL via INTRAVENOUS

## 2024-05-14 MED ORDER — CEFTRIAXONE SODIUM 1 G IJ SOLR
1.0000 g | Freq: Once | INTRAMUSCULAR | Status: AC
  Administered 2024-05-14: 1 g via INTRAVENOUS
  Filled 2024-05-14: qty 10

## 2024-05-14 MED ORDER — LACTATED RINGERS IV SOLN: INTRAVENOUS | Status: DC

## 2024-05-14 MED ORDER — PREDNISONE 20 MG PO TABS
40.0000 mg | ORAL_TABLET | Freq: Every day | ORAL | Status: DC
  Administered 2024-05-15 – 2024-05-17 (×3): 40 mg via ORAL
  Filled 2024-05-14 (×3): qty 2

## 2024-05-14 MED ORDER — LORAZEPAM 2 MG/ML IJ SOLN: 1.0000 mg | INTRAMUSCULAR | Status: DC | PRN

## 2024-05-14 MED ORDER — ACETAMINOPHEN 650 MG RE SUPP
650.0000 mg | Freq: Four times a day (QID) | RECTAL | Status: DC | PRN
Start: 2024-05-14 — End: 2024-05-17

## 2024-05-14 MED ORDER — ADULT MULTIVITAMIN W/MINERALS CH
1.0000 | ORAL_TABLET | Freq: Every day | ORAL | Status: DC
  Administered 2024-05-14 – 2024-05-17 (×4): 1 via ORAL
  Filled 2024-05-14 (×4): qty 1

## 2024-05-14 MED ORDER — SODIUM CHLORIDE 0.9% FLUSH
3.0000 mL | Freq: Two times a day (BID) | INTRAVENOUS | Status: DC
  Administered 2024-05-14 – 2024-05-17 (×6): 3 mL via INTRAVENOUS

## 2024-05-14 MED ORDER — IPRATROPIUM-ALBUTEROL 0.5-2.5 (3) MG/3ML IN SOLN
3.0000 mL | Freq: Four times a day (QID) | RESPIRATORY_TRACT | Status: DC
  Administered 2024-05-14 – 2024-05-15 (×5): 3 mL via RESPIRATORY_TRACT
  Filled 2024-05-14 (×8): qty 3

## 2024-05-14 MED ORDER — THIAMINE MONONITRATE 100 MG PO TABS
100.0000 mg | ORAL_TABLET | Freq: Every day | ORAL | Status: DC
  Administered 2024-05-14 – 2024-05-17 (×4): 100 mg via ORAL
  Filled 2024-05-14 (×4): qty 1

## 2024-05-14 MED ORDER — ALBUTEROL SULFATE (2.5 MG/3ML) 0.083% IN NEBU
2.5000 mg | INHALATION_SOLUTION | RESPIRATORY_TRACT | Status: DC | PRN
  Administered 2024-05-15 – 2024-05-16 (×2): 2.5 mg via RESPIRATORY_TRACT
  Filled 2024-05-14 (×2): qty 3

## 2024-05-14 MED ORDER — SODIUM CHLORIDE 0.9 % IV SOLN
500.0000 mg | Freq: Once | INTRAVENOUS | Status: AC
  Administered 2024-05-14: 500 mg via INTRAVENOUS
  Filled 2024-05-14: qty 5

## 2024-05-14 MED ORDER — SODIUM CHLORIDE 0.9 % IV SOLN: Freq: Once | INTRAVENOUS | Status: AC

## 2024-05-14 MED ORDER — ACETAMINOPHEN 325 MG PO TABS
650.0000 mg | ORAL_TABLET | Freq: Four times a day (QID) | ORAL | Status: DC | PRN
  Administered 2024-05-14 – 2024-05-16 (×5): 650 mg via ORAL
  Filled 2024-05-14 (×5): qty 2

## 2024-05-14 NOTE — ED Provider Notes (Signed)
 [place] EMERGENCY DEPARTMENT AT [facility] Provider Note   CSN: [id number] Arrival date & time: 05/14/24  1205     Patient presents with: Shortness of Breath   [name] is a 88 y.o. male.   HPI     Pt bib ems from [place] [facility] [place]. Pt was doing PT and c.o generalized weakness and SOB. Pt legs got weak during PT and almost fell. Pt denies chest pain at this time.    88% on room air upon arrival, lungs diminished then started moving around and developed wheezing and rhonchi.   Pt given 10mg  albuterol , 0.5mg  atrovent and 125mg  solumedrol IV.    Pt also had a fall this morning where he fell out of bed, ems came out and helped him back into bed and wife states he seemed altered after the fall but by the time PT got there he was back to normal.    Hx of COPD   Allergies: Lisinopril , Other, Pneumococcal vac polyvalent, Prednisone , Prevnar 13 [pneumococcal 13-val conj vacc], Penicillins, and Sulfa antibiotics    Review of Systems  Updated Vital Signs BP (!) 140/82   Pulse (!) 106   Temp 97.8 F (36.6 C) (Oral)   Resp (!) 21   Ht 1.778 m (5' 10)   Wt 86.2 kg   SpO2 93%   BMI 27.26 kg/m   Physical Exam Vitals and nursing note reviewed.  Constitutional:      General: He is not in acute distress.    Appearance: He is well-developed.  HENT:     Head: Normocephalic and atraumatic.  Eyes:     Conjunctiva/sclera: Conjunctivae normal.  Cardiovascular:     Rate and Rhythm: Regular rhythm. Tachycardia present.  Pulmonary:     Effort: Tachypnea and accessory muscle usage present.     Breath sounds: Decreased breath sounds and wheezing present.  Abdominal:     General: There is no distension.  Skin:    General: Skin is warm and dry.  Neurological:     Mental Status: He is alert and oriented to person, place, and time.     (all labs ordered are listed, but only abnormal results are displayed) Labs Reviewed  COMPREHENSIVE METABOLIC PANEL WITH  GFR - Abnormal; Notable for the following components:      Result Value   Sodium 122 (*)    Chloride 86 (*)    CO2 21 (*)    Glucose, Bld 243 (*)    Calcium  8.5 (*)    Albumin 3.2 (*)    All other components within normal limits  CBC WITH DIFFERENTIAL/PLATELET - Abnormal; Notable for the following components:   WBC 18.6 (*)    RBC 3.78 (*)    Hemoglobin 11.7 (*)    HCT 34.2 (*)    Neutro Abs 16.8 (*)    Lymphs Abs 0.6 (*)    Monocytes Absolute 1.1 (*)    All other components within normal limits  RESP PANEL BY RT-PCR (RSV, FLU A&B, COVID)  RVPGX2  BRAIN NATRIURETIC PEPTIDE  ETHANOL  VITAMIN B12  FOLATE  IRON AND TIBC  FERRITIN  RETICULOCYTES  PROCALCITONIN  I-STAT CG4 LACTIC ACID, ED  I-STAT CG4 LACTIC ACID, ED  ABO/RH  TYPE AND SCREEN    EKG: EKG Interpretation Date/Time:  Friday May 14 2024 12:10:51 EDT Ventricular Rate:  110 PR Interval:  176 QRS Duration:  101 QT Interval:  327 QTC Calculation: 443 R Axis:   0  Text Interpretation:  Sinus tachycardia RSR' in V1 or V2, right VCD or RVH Confirmed by Garrick Charleston [phone]) on 05/14/2024 12:55:36 PM  Radiology: [facility] Chest Port 1 View Result Date: 05/14/2024 EXAM: 1 VIEW(S) XRAY OF THE CHEST 05/14/2024 12:36:07 PM COMPARISON: Comparison 01/05/2024. CLINICAL HISTORY: SOB SOB. FINDINGS: LUNGS AND PLEURA: Moderate-sized right pleural effusion is noted with associated right basilar atelectasis or infiltrate. Minimal left basilar subsegmental atelectasis is noted. No pulmonary edema. No pneumothorax. HEART AND MEDIASTINUM: No acute abnormality of the cardiac and mediastinal silhouettes. BONES AND SOFT TISSUES: No acute osseous abnormality. IMPRESSION: 1. Moderate right pleural effusion with associated right basilar atelectasis or infiltrate. 2. Minimal left basilar subsegmental atelectasis. Electronically signed by: Lynwood Seip MD 05/14/2024 12:55 PM EDT RP Workstation: [id number]     Procedures   Medications Ordered  in the ED  cefTRIAXone  (ROCEPHIN ) 1 g in sodium chloride  0.9 % 100 mL IVPB (0 g Intravenous Stopped 05/14/24 1343)  azithromycin  (ZITHROMAX ) 500 mg in sodium chloride  0.9 % 250 mL IVPB (0 mg Intravenous Stopped 05/14/24 1450)  thiamine (VITAMIN B1) 500 mg in sodium chloride  0.9 % 50 mL IVPB (0 mg Intravenous Stopped 05/14/24 1528)                                    Medical Decision Making Patient with history of COPD presents with shortness of breath, tachycardic, tachypneic with diminished lung sounds, concern for exacerbation versus pneumonia versus other acute phenomena such as heart failure, though this is less likely. Patient has multiple interventions started by EMS, on arrival continued to receive bronchodilator. Cardiac 110 sinus tach abnormal pulse ox 98% nasal cannula abnormal Home room air saturation was 88%.  Amount and/or Complexity of Data Reviewed Independent Historian: EMS    Details: At bedside Labs: ordered. Decision-making details documented in ED Course. Radiology: ordered and independent interpretation performed. Decision-making details documented in ED Course. ECG/medicine tests: ordered and independent interpretation performed. Decision-making details documented in ED Course.  Risk Prescription drug management. Decision regarding hospitalization. Diagnosis or treatment significantly limited by social determinants of health.   Patient in no distress on repeat exam I discussed all findings with the patient and his son at bedside.  With concern for right lower lobe pneumonia patient will require admission, IV antibiotics started.  Patient continues to require 2 L via nasal cannula as he is hypoxic, 88%. After discussing the admission with our hospitalist colleague, Dr. Tobie, the patient's alcohol use is a consideration for aspiration pneumonia, complicated pneumonia, CT scan ordered by Dr. Tobie.    Final diagnoses:  Community acquired pneumonia of right lower  lobe of lung    ED Discharge Orders     None          Garrick Charleston, MD 05/14/24 1545

## 2024-05-14 NOTE — ED Triage Notes (Signed)
 Pt bib ems from [place] [facility] [place]. Pt was doing PT and c.o generalized weakness and SOB. Pt legs got weak during PT and almost fell. Pt denies chest pain at this time.   88% on room air upon arrival, lungs diminished then started moving around and developed wheezing and rhonchi.  Pt given 10mg  albuterol , 0.5mg  atrovent and 125mg  solumedrol IV.   Pt also had a fall this morning where he fell out of bed, ems came out and helped him back into bed and wife states he seemed altered after the fall but by the time PT got there he was back to normal.   Hx of COPD

## 2024-05-14 NOTE — ED Notes (Signed)
 CCMD called by this RN

## 2024-05-14 NOTE — Progress Notes (Signed)
 Ct chest followup:  Narrative & Impression  EXAM: CTA of the Chest with contrast for PE 05/14/2024 05:45:00 PM   TECHNIQUE: CTA of the chest was performed after the administration of intravenous contrast (iohexol  (OMNIPAQUE ) 350 MG/ML injection 75 mL IOHEXOL  350 MG/ML SOLN). Multiplanar reformatted images are provided for review. MIP images are provided for review. Automated exposure control, iterative reconstruction, and/or weight based adjustment of the mA/kV was utilized to reduce the radiation dose to as low as reasonably achievable.   COMPARISON: 05/14/2023   CLINICAL HISTORY: Pulmonary embolism (PE) suspected, high prob. Shortness of breath   FINDINGS:   PULMONARY ARTERIES: Pulmonary arteries are adequately opacified for evaluation. No pulmonary embolism. Main pulmonary artery is normal in caliber.   MEDIASTINUM: Extensive 3-vessel coronary artery disease. The heart and pericardium demonstrate no acute abnormality. Scattered aortic atherosclerosis. There is no acute abnormality of the thoracic aorta.   LYMPH NODES: Mildly prominent mediastinal lymph nodes are stable, likely reactive. No hilar or axillary adenopathy.   LUNGS AND PLEURA: Right apical nodule measures 11 mm compared to 13 mm previously, not significantly changed. New right upper lobe pulmonary nodule on image 29 measures up to 1.4 cm. New right upper lobe nodule measures 5 mm on image 23. Large loculated right pneumothorax. Compressive atelectasis in the right middle lobe and right lower lobe. Airway thickening noted in the left lower lobe with associated left lower lobe atelectasis. No pleural effusion.   UPPER ABDOMEN: Limited images of the upper abdomen are unremarkable.   SOFT TISSUES AND BONES: Chronic moderate compression fractures at T8 and L1, stable. No acute bony abnormality. No acute soft tissue abnormality.   IMPRESSION: 1. No evidence of pulmonary embolism. 2. Large loculated right  pneumothorax with compressive atelectasis in the right middle and lower lobes. 3. New right upper lobe pulmonary nodules measuring up to 1.4 cm and 5 mm; recommend non-contrast chest CT at 3 months, PET/CT, or tissue sampling per Fleischner Society Guidelines. 4. Stable right apical solid pulmonary nodule measuring 11 mm; management as above for most suspicious nodule.     Will reach out to PCCM, Dr.Shah. CT imaging shows large loculated pleura effusion.

## 2024-05-14 NOTE — H&P (Addendum)
 History and Physical    Patient: [name] FMW:[id number] DOB: 22-Mar-1936 DOA: 05/14/2024 DOS: the patient was seen and examined on 05/14/2024 . PCP: [facility].  Patient coming from: Home Chief complaint: Chief Complaint  Patient presents with   Shortness of Breath   HPI:  [name] is a 88 y.o. male with past medical history  of allergies to multiple medications including lisinopril  Pneumovax prednisone  Prevnar penicillin and sulfa,  --Presenting with generalized weakness shortness of breath, falls today morning patient had a fall where EMS was called they helped him back in the bed and he appeared somewhat disoriented and parents brought to the emergency room today.  During initial evaluation patient was noted to be 80% on room air and was given albuterol  Atrovent and Solu-Medrol .  ED Course:  Vital signs in the ED were notable for the following:  Vitals:   05/14/24 1209 05/14/24 1210 05/14/24 1215 05/14/24 1430  BP: 130/84  124/68 (!) 140/82  Pulse: (!) 111  (!) 109 (!) 106  Temp: 97.8 F (36.6 C)     Resp: (!) 23  (!) 23 (!) 21  Height:  5' 10 (1.778 m)    Weight:  86.2 kg    SpO2: 99%  100% 93%  TempSrc: Oral     BMI (Calculated):  27.26     >>ED evaluation thus far shows: - Cmp shows sodium of 122/ cl of 86 and nad bicarb of 21, glucose 243 and lft is normal. - CBC shows wbc of 18.6 and hb of 11.7, and plt of 313. - Abnormal chest x-ray showing moderate right pleural effusion right basilar atelectasis or infiltrate along with left atelectasis.   >>While in the ED patient received the following: Medications  azithromycin  (ZITHROMAX ) 500 mg in sodium chloride  0.9 % 250 mL IVPB (500 mg Intravenous New Bag/Given 05/14/24 1346)  thiamine (VITAMIN B1) 500 mg in sodium chloride  0.9 % 50 mL IVPB (has no administration in time range)  cefTRIAXone  (ROCEPHIN ) 1 g in sodium chloride  0.9 % 100 mL IVPB (0 g Intravenous Stopped 05/14/24 1343)   Review of Systems   Respiratory:  Positive for cough, shortness of breath and wheezing.   Neurological:  Positive for dizziness and weakness.   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 No pertinent surgical history.  reports that he quit smoking about 10 years ago. His smoking use included cigarettes. He started smoking about 70 years ago. He has a 30 pack-year smoking history. He has never used smokeless tobacco. He reports current alcohol use of about 14.0 standard drinks of alcohol per week. He reports that he does not use drugs. Allergies  Allergen Reactions   Lisinopril  Cough   Other Other (See Comments)   Pneumococcal Vac Polyvalent Other (See Comments)    Fever, sweats   Prednisone  Other (See Comments)    Sweating with high dose   Prevnar 13 [Pneumococcal 13-Val Conj Vacc] Swelling  Penicillins Rash   Sulfa Antibiotics Rash   Family History  Problem Relation Age of Onset   Stroke Father    Prior to Admission medications   Medication Sig Start Date End Date Taking? Authorizing Provider  albuterol  (PROVENTIL  HFA;VENTOLIN  HFA) 108 (90 BASE) MCG/ACT inhaler Inhale 2 puffs into the lungs every 6 (six) hours as needed for wheezing or shortness of breath.    [provider]  Ascorbic Acid  (VITAMIN C  PO) Take 1 tablet by mouth daily.    [provider]  aspirin  81 MG tablet Take 81 mg by mouth daily.    [provider]  cetirizine (ZYRTEC) 10 MG tablet Take 10 mg by mouth daily.    [provider]  famotidine  (PEPCID ) 20 MG tablet One at bedtime Patient taking differently: Take 20 mg by mouth at bedtime. 04/08/16   Darlean Ozell NOVAK, MD  finasteride  (PROSCAR ) 5 MG tablet Take 5 mg by mouth daily.    [provider]  folic acid (FOLVITE) 1 MG tablet Take 1 mg by mouth daily.    [provider]  gabapentin  (NEURONTIN ) 100 MG capsule Take 1 capsule (100 mg total) by mouth at bedtime. 02/04/22   Lomax, Amy, NP  glipiZIDE  (GLUCOTROL  XL) 10 MG 24 hr tablet Take 10 mg by mouth daily. 01/23/20   [provider]  ipratropium-albuterol  (DUONEB) 0.5-2.5 (3) MG/3ML SOLN Take 3 mLs by nebulization every 4 (four) hours as needed for up to 30 doses. 01/05/24   Jerral Meth, MD  losartan (COZAAR) 100 MG tablet Take 100 mg by mouth daily. 03/20/19   [provider]  metFORMIN  (GLUCOPHAGE ) 500 MG tablet Take 1 tablet (500 mg total) by mouth 2 (two) times daily with a meal. Resume tonight. 09/09/14   Verdene Purchase, MD  methotrexate 2.5 MG tablet Take 2.5 mg by mouth once a week. Take 6 tablets orally once a week    [provider]  Multiple Vitamin (MULTIVITAMIN WITH MINERALS) TABS Take 1 tablet by mouth daily.    [provider]  omeprazole (PRILOSEC) 40 MG capsule Take 40 mg by mouth daily.    [provider]  ONE TOUCH ULTRA TEST test strip  09/28/15   [provider]  AISHA PASTOR LANCETS 33G MISC  09/28/15   [provider]  SYMBICORT  160-4.5 MCG/ACT inhaler INHALE 2 PUFFS INTO THE LUNGS 2 TIMES DAILY Patient taking differently: Inhale 2 puffs into the lungs 2 (two) times daily. 11/04/16   Darlean Ozell NOVAK, MD  Tamsulosin  HCl (FLOMAX ) 0.4 MG CAPS Take 0.4 mg by mouth daily.    [provider]                                                                                 Vitals:   05/14/24 1209 05/14/24 1210 05/14/24 1215 05/14/24 1430  BP: 130/84  124/68 (!) 140/82  Pulse: (!) 111  (!) 109 (!) 106   Resp: (!) 23  (!) 23 (!) 21  Temp: 97.8 F (36.6 C)     TempSrc: Oral     SpO2: 99%  100% 93%  Weight:  86.2 kg    Height:  5' 10 (1.778 m)     Physical Exam Vitals reviewed.  Constitutional:      General: He is not in acute distress.    Appearance: He is ill-appearing.  HENT:     Head: Normocephalic.     Right Ear: External ear normal.     Left Ear: External ear normal.  Eyes:     Extraocular Movements: Extraocular movements intact.  Cardiovascular:     Rate and Rhythm: Normal rate and regular rhythm.     Pulses: Normal pulses.     Heart sounds: Normal heart sounds.  Pulmonary:     Effort: Pulmonary effort is normal.     Breath sounds: Examination of the right-upper field reveals wheezing. Examination of the left-upper field reveals wheezing. Examination of the right-middle field reveals wheezing. Examination of the left-middle field reveals wheezing. Examination of the right-lower field reveals wheezing. Examination of the left-lower field reveals wheezing. Wheezing present.  Abdominal:     General: There is no distension.     Palpations: Abdomen is soft.     Tenderness: There is no abdominal tenderness.  Musculoskeletal:     Right lower leg: No edema.     Left lower leg: No edema.  Skin:    General: Skin is warm.  Neurological:     General: No focal deficit present.     Mental Status: He is alert and oriented to person, place, and time.     Labs on Admission: I have personally reviewed following labs and imaging studies CBC: Recent Labs  Lab 05/14/24 1225  WBC 18.6*  NEUTROABS 16.8*  HGB 11.7*  HCT 34.2*  MCV 90.5  PLT 313   Basic Metabolic Panel: Recent Labs  Lab 05/14/24 1225  NA 122*  K 4.5  CL 86*  CO2 21*  GLUCOSE 243*  BUN 12  CREATININE 0.62  CALCIUM  8.5*   GFR: Estimated Creatinine Clearance: 68.4 mL/min (by C-G formula based on SCr of 0.62 mg/dL). Liver Function Tests: Recent Labs  Lab 05/14/24 1225  AST 22  ALT 20  ALKPHOS 70   BILITOT 1.2  PROT 6.5  ALBUMIN 3.2*   No results for input(s): LIPASE, AMYLASE in the last 168 hours. No results for input(s): AMMONIA in the last 168 hours. Recent Labs    01/05/24 0425 05/14/24 1225  BUN 16 12  CREATININE 0.62 0.62    Cardiac Enzymes: No results for input(s): CKTOTAL, CKMB, CKMBINDEX, TROPONINI in the last 168 hours. BNP (last 3 results) No results for input(s): PROBNP in the last 8760 hours. HbA1C: No results for input(s): HGBA1C in the last 72 hours. CBG: No results for input(s): GLUCAP in the last 168 hours. Lipid Profile: No results for input(s): CHOL, HDL, LDLCALC, TRIG, CHOLHDL, LDLDIRECT in the last 72 hours. Thyroid  Function Tests: No results for input(s): TSH, T4TOTAL, FREET4, T3FREE, THYROIDAB in the last 72 hours. Anemia Panel: No results for input(s): VITAMINB12, FOLATE, FERRITIN, TIBC, IRON, RETICCTPCT in the last 72 hours. Urine analysis:    Component Value Date/Time   COLORURINE YELLOW 02/12/2014 1204   APPEARANCEUR CLEAR 02/12/2014 1204   LABSPEC 1.014 02/12/2014 1204   PHURINE 6.5 02/12/2014 1204   GLUCOSEU 100 (A) 02/12/2014 1204   HGBUR NEGATIVE 02/12/2014 1204   BILIRUBINUR NEGATIVE 02/12/2014 1204   KETONESUR NEGATIVE 02/12/2014 1204   PROTEINUR NEGATIVE 02/12/2014 1204   UROBILINOGEN 0.2 02/12/2014 1204   NITRITE NEGATIVE 02/12/2014 1204   LEUKOCYTESUR NEGATIVE 02/12/2014 1204   Radiological Exams on Admission: DG  Chest Port 1 View Result Date: 05/14/2024 EXAM: 1 VIEW(S) XRAY OF THE CHEST 05/14/2024 12:36:07 PM COMPARISON: Comparison 01/05/2024. CLINICAL HISTORY: SOB SOB. FINDINGS: LUNGS AND PLEURA: Moderate-sized right pleural effusion is noted with associated right basilar atelectasis or infiltrate. Minimal left basilar subsegmental atelectasis is noted. No pulmonary edema. No pneumothorax. HEART AND MEDIASTINUM: No acute abnormality of the cardiac and mediastinal  silhouettes. BONES AND SOFT TISSUES: No acute osseous abnormality. IMPRESSION: 1. Moderate right pleural effusion with associated right basilar atelectasis or infiltrate. 2. Minimal left basilar subsegmental atelectasis. Electronically signed by: Lynwood Seip MD 05/14/2024 12:55 PM EDT RP Workstation: [id number]   Data Reviewed: Relevant notes from primary care and specialist visits, past discharge summaries as available in EHR, including Care Everywhere . Prior diagnostic testing as pertinent to current admission diagnoses, Updated medications and problem lists for reconciliation .ED course, including vitals, labs, imaging, treatment and response to treatment,Triage notes, nursing and pharmacy notes and ED provider's notes.Notable results as noted in HPI.Discussed case with EDMD/ ED APP/ or Specialty MD on call and as needed.  Assessment & Plan   >> Shortness of breath/acute respiratory failure with hypoxia/CAP/COPD Attribute to patient's suspected pneumonia, procalcitonin ordered and pending will consider CTA chest PE protocol due to hypoxia.  Differentials also include pleural effusion from autoimmune issue as patient has a history of rheumatoid arthritis.  Continue with supplemental oxygen and will continue with steroid therapy as patient does have a history of known COPD as well.  Continuous pulse oximetry and telemetry monitoring.  Will also continue patient on CAP coverage with antibiotics initiated in the emergency room with Rocephin  and azithromycin .   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  >> Essential hypertension: Vitals:   05/14/24 1209 05/14/24 1215 05/14/24 1430  BP: 130/84 124/68 (!) 140/82  Blood pressure is stable, PTA meds include losartan 100. Will continue with the same.  >> Diabetes mellitus type 2: Swallow evaluation, carb consistent cardiac diet, glycemic protocol. Will hold glipizide  and metformin .  A1c if no recent 1 in the past few months.   >>  Alcohol abuse: Will verify last drink patient given thiamine stat, ethanol level ordered. Fall precaution aspiration precaution CIWA precaution.   >> Anemia:    Latest Ref Rng & Units 05/14/2024   12:25 PM 01/05/2024    4:25 AM 05/12/2023    3:14 PM  CBC  WBC 4.0 - 10.5 K/uL 18.6  7.3  5.2   Hemoglobin 13.0 - 17.0 g/dL 88.2  86.9  87.0   Hematocrit 39.0 - 52.0 % 34.2  36.5  36.5   Platelets 150 - 400 K/uL 313  282  235   Will follow, type and screen, stool occult, IV PPI.  Anemia panel.  >>Hyponatremia: Chronic hyponatremia attributed to chronic alcohol use. Will obtain serum osmolality urine osmolality urine sodium TSH urinalysis and follow.   DVT prophylaxis:  SCDs Consults:  None  Advance Care Planning:    Code Status: Full Code   Family Communication:  None Disposition Plan:  Home Severity of Illness: The appropriate patient status for this patient is INPATIENT. Inpatient status is judged to be reasonable and necessary in order to provide the required intensity of service to ensure the patient's safety. The patient's presenting symptoms, physical exam findings, and initial radiographic and laboratory data in the context of their chronic comorbidities is felt to place them at high risk for further clinical deterioration. Furthermore, it is not anticipated that the patient will be medically stable  for discharge from the hospital within 2 midnights of admission.   * I certify that at the point of admission it is my clinical judgment that the patient will require inpatient hospital care spanning beyond 2 midnights from the point of admission due to high intensity of service, high risk for further deterioration and high frequency of surveillance required.*  Unresulted Labs (From admission, onward)     Start     Ordered   05/15/24 0500  Occult blood card to lab, stool  Daily,   R        05/14/24 1418   05/15/24 0500  Comprehensive metabolic panel  Tomorrow morning,   R         05/14/24 1553   05/15/24 0500  CBC  Tomorrow morning,   R        05/14/24 1553   05/14/24 1420  Procalcitonin  Add-on,   AD        05/14/24 1419   05/14/24 1418  Vitamin B12  (Anemia Panel (PNL))  Once,   URGENT        05/14/24 1418   05/14/24 1418  Folate  (Anemia Panel (PNL))  Once,   URGENT        05/14/24 1418   05/14/24 1418  Iron and TIBC  (Anemia Panel (PNL))  Once,   URGENT        05/14/24 1418   05/14/24 1418  Ferritin  (Anemia Panel (PNL))  Once,   URGENT        05/14/24 1418   05/14/24 1418  Reticulocytes  (Anemia Panel (PNL))  Once,   URGENT        05/14/24 1418   05/14/24 1414  Type and screen  Once,   STAT        05/14/24 1418   05/14/24 1413  Ethanol  Add-on,   AD        05/14/24 1418            Meds ordered this encounter  Medications   cefTRIAXone  (ROCEPHIN ) 1 g in sodium chloride  0.9 % 100 mL IVPB    Antibiotic Indication::   CAP   azithromycin  (ZITHROMAX ) 500 mg in sodium chloride  0.9 % 250 mL IVPB    Antibiotic Indication::   Other Indication (list below)   thiamine (VITAMIN B1) 500 mg in sodium chloride  0.9 % 50 mL IVPB   OR Linked Order Group    LORazepam (ATIVAN) tablet 1-4 mg     CIWA-AR < 5 =:   0 mg     CIWA-AR 5 -10 =:   1 mg     CIWA-AR 11 -15 =:   2 mg     CIWA-AR 16 -20 =:   3 mg     CIWA-AR 16 -20 =:   Recheck CIWA-AR in 1 hour; if > 20 notify MD     CIWA-AR > 20 =:   4 mg     CIWA-AR > 20 =:   Call Rapid Response    LORazepam (ATIVAN) injection 1-4 mg     CIWA-AR < 5 =:   0 mg     CIWA-AR 5 -10 =:   1 mg     CIWA-AR 11 -15 =:   2 mg     CIWA-AR 16 -20 =:   3 mg     CIWA-AR 16 -20 =:   Recheck CIWA-AR in 1 hour; if > 20 notify MD     CIWA-AR > 20 =:  4 mg     CIWA-AR > 20 =:   Call Rapid Response   OR Linked Order Group    thiamine (VITAMIN B1) tablet 100 mg    thiamine (VITAMIN B1) injection 100 mg   folic acid (FOLVITE) tablet 1 mg   multivitamin with minerals tablet 1 tablet   FOLLOWED BY Linked Order Group     methylPREDNISolone  sodium succinate (SOLU-MEDROL ) 125 mg/2 mL injection 60 mg     IV methylprednisolone  will be converted to either a q12h or q24h frequency with the same total daily dose (TDD).  Ordered Dose: 1 to 125 mg TDD; convert to: TDD q24h.  Ordered Dose: 126 to 250 mg TDD; convert to: TDD div q12h.  Ordered Dose: >250 mg TDD; DAW.    predniSONE  (DELTASONE ) tablet 40 mg   ipratropium-albuterol  (DUONEB) 0.5-2.5 (3) MG/3ML nebulizer solution 3 mL   albuterol  (PROVENTIL ) (2.5 MG/3ML) 0.083% nebulizer solution 2.5 mg   sodium chloride  flush (NS) 0.9 % injection 3 mL   lactated ringers infusion   OR Linked Order Group    acetaminophen  (TYLENOL ) tablet 650 mg    acetaminophen  (TYLENOL ) suppository 650 mg   guaiFENesin  (MUCINEX ) 12 hr tablet 600 mg   hydrALAZINE (APRESOLINE) injection 5 mg   cefTRIAXone  (ROCEPHIN ) 1 g in sodium chloride  0.9 % 100 mL IVPB    Antibiotic Indication::   Other Indication (list below)    Other Indication::   COPD exacerbation     Orders Placed This Encounter  Procedures   Resp panel by RT-PCR (RSV, Flu A&B, Covid) Anterior Nasal Swab   DG Chest Port 1 View   CT Angio Chest PE W and/or Wo Contrast   Comprehensive metabolic panel   CBC with Differential/Platelet   Brain natriuretic peptide   Ethanol   Occult blood card to lab, stool   Vitamin B12   Folate   Iron and TIBC   Ferritin   Reticulocytes   Procalcitonin   Comprehensive metabolic panel   CBC   Diet heart healthy/carb modified Room service appropriate? Yes; Fluid consistency: Thin   ED Cardiac monitoring   Initiate Carrier Fluid Protocol   Clinical institute withdrawal assessment   Vital signs every 6 hours X 48 hours, then per unit protocol   Refer to Sidebar Report for reference: ETOH Withdrawal Guidelines   Clinical Institute Withdrawal Assessent (CIWA)   If Ativan given, reassess Clinical Institute Withdrawal Assessment (CIWA) with blood pressure and pulse rate within 1 hour of  Ativan administration   Notify Pharmacy to change IV Ativan to PO if tolerating POs well.   Notify physician (specify)   Refer to Sidebar Report:   Apply Chronic Obstructive Pulmonary Disease Care Plan   Provide Smoking / Tobacco cessation education.  Provide education material and information on community counseling.   Cardiac Monitoring Continuous x 24 hours Indications for use: Other; other indications for use: COPD exacerbation   Maintain IV access   Vital signs   Notify physician (specify)   Mobility Protocol: No Restrictions   Refer to Sidebar Report Mobility Protocol for Adult Inpatient   Initiate Adult Central Line Maintenance and Catheter Clearance Protocol for patients with central line (CVC, PICC, Port, Hemodialysis, Trialysis)   If patient diabetic or glucose greater than 140 notify physician for Sliding Scale Insulin  Orders   Intake and Output   Initiate CHG Protocol for patients in ICU/SD or any patient with a central line or foley catheter   Do not place and if present  remove PureWick   Initiate Oral Care Protocol   Initiate Carrier Fluid Protocol   Nurse to provide smoking / tobacco cessation education   RN may order General Admission PRN Orders utilizing General Admission PRN medications (through manage orders) for the following patient needs: allergy symptoms (Claritin ), cold sores (Carmex), cough (Robitussin DM), eye irritation (Liquifilm Tears), hemorrhoids (Tucks), indigestion (Maalox), minor skin irritation (Hydrocortisone Cream), muscle pain Lucienne Gay), nose irritation (saline nasal spray) and sore throat (Chloraseptic spray).   Swallow screen   Full code   Consult to hospitalist   Consult to Transition of Care Team   Nutritional services consult   Consult to Transition of Care Team   Consult to respiratory care treatment   Pharmacy consult   OT eval and treat   PT eval and treat   ED Pulse oximetry, continuous   Pulse oximetry check with vital signs   Oxygen  therapy Mode or (Route): Nasal cannula; Liters Per Minute: 2; Keep O2 saturation between: greater than 92 %   I-Stat CG4 Lactic Acid   EKG 12-Lead   ED EKG   Type and screen   ABO/Rh   Saline lock IV   Admit to Inpatient (patient's expected length of stay will be greater than 2 midnights or inpatient only procedure)   Aspiration precautions   Fall precautions    Author: Eddi LULLA Blanch, MD 12 pm- 8 pm. [facility]. 05/14/2024 3:53 PM Please note for any communication after hours contact [facility] Assigned provider on call on Amion.

## 2024-05-15 ENCOUNTER — Inpatient Hospital Stay (HOSPITAL_COMMUNITY)

## 2024-05-15 DIAGNOSIS — F1010 Alcohol abuse, uncomplicated: Secondary | ICD-10-CM | POA: Insufficient documentation

## 2024-05-15 DIAGNOSIS — J90 Pleural effusion, not elsewhere classified: Secondary | ICD-10-CM | POA: Insufficient documentation

## 2024-05-15 DIAGNOSIS — J9601 Acute respiratory failure with hypoxia: Secondary | ICD-10-CM

## 2024-05-15 DIAGNOSIS — N400 Enlarged prostate without lower urinary tract symptoms: Secondary | ICD-10-CM | POA: Insufficient documentation

## 2024-05-15 LAB — COMPREHENSIVE METABOLIC PANEL WITH GFR
ALT: 16 U/L (ref 0–44)
AST: 17 U/L (ref 15–41)
Albumin: 2.9 g/dL — ABNORMAL LOW (ref 3.5–5.0)
Alkaline Phosphatase: 63 U/L (ref 38–126)
Anion gap: 13 (ref 5–15)
BUN: 10 mg/dL (ref 8–23)
CO2: 21 mmol/L — ABNORMAL LOW (ref 22–32)
Calcium: 8.7 mg/dL — ABNORMAL LOW (ref 8.9–10.3)
Chloride: 91 mmol/L — ABNORMAL LOW (ref 98–111)
Creatinine, Ser: 0.65 mg/dL (ref 0.61–1.24)
GFR, Estimated: >60 mL/min (ref >60)
Glucose, Bld: 264 mg/dL — ABNORMAL HIGH (ref 70–99)
Potassium: 4.2 mmol/L (ref 3.5–5.1)
Sodium: 125 mmol/L — ABNORMAL LOW (ref 135–145)
Total Bilirubin: 0.5 mg/dL (ref 0.0–1.2)
Total Protein: 6.6 g/dL (ref 6.5–8.1)

## 2024-05-15 LAB — PROTEIN, PLEURAL OR PERITONEAL FLUID: Total protein, fluid: 4.7 g/dL

## 2024-05-15 LAB — GRAM STAIN

## 2024-05-15 LAB — CBC
HCT: 33.9 % — ABNORMAL LOW (ref 39.0–52.0)
Hemoglobin: 12.2 g/dL — ABNORMAL LOW (ref 13.0–17.0)
MCH: 32 pg (ref 26.0–34.0)
MCHC: 36 g/dL (ref 30.0–36.0)
MCV: 89 fL (ref 80.0–100.0)
Platelets: 311 K/uL (ref 150–400)
RBC: 3.81 MIL/uL — ABNORMAL LOW (ref 4.22–5.81)
RDW: 14.3 % (ref 11.5–15.5)
WBC: 17 K/uL — ABNORMAL HIGH (ref 4.0–10.5)
nRBC: 0 % (ref 0.0–0.2)

## 2024-05-15 LAB — CREATININE, URINE, RANDOM: Creatinine, Urine: 31 mg/dL

## 2024-05-15 LAB — GLUCOSE, PLEURAL OR PERITONEAL FLUID: Glucose, Fluid: 203 mg/dL

## 2024-05-15 LAB — GLUCOSE, CAPILLARY
Glucose-Capillary: 247 mg/dL — ABNORMAL HIGH (ref 70–99)
Glucose-Capillary: 255 mg/dL — ABNORMAL HIGH (ref 70–99)
Glucose-Capillary: 261 mg/dL — ABNORMAL HIGH (ref 70–99)
Glucose-Capillary: 277 mg/dL — ABNORMAL HIGH (ref 70–99)
Glucose-Capillary: 305 mg/dL — ABNORMAL HIGH (ref 70–99)

## 2024-05-15 LAB — OSMOLALITY, URINE: Osmolality, Ur: 387 mosm/kg (ref 300–900)

## 2024-05-15 LAB — LACTATE DEHYDROGENASE, PLEURAL OR PERITONEAL FLUID: LD, Fluid: 565 U/L — ABNORMAL HIGH (ref 3–23)

## 2024-05-15 LAB — HEMOGLOBIN A1C
Hgb A1c MFr Bld: 5.7 % — ABNORMAL HIGH (ref 4.8–5.6)
Mean Plasma Glucose: 116.89 mg/dL

## 2024-05-15 MED ORDER — GABAPENTIN 300 MG PO CAPS
300.0000 mg | ORAL_CAPSULE | Freq: Two times a day (BID) | ORAL | Status: DC
  Administered 2024-05-15 – 2024-05-17 (×5): 300 mg via ORAL
  Filled 2024-05-15 (×5): qty 1

## 2024-05-15 MED ORDER — SODIUM CHLORIDE 0.9 % IV SOLN: 2.0000 g | Freq: Once | INTRAVENOUS | Status: DC

## 2024-05-15 MED ORDER — ENSURE PLUS HIGH PROTEIN PO LIQD
237.0000 mL | Freq: Three times a day (TID) | ORAL | Status: DC
  Administered 2024-05-15 – 2024-05-17 (×6): 237 mL via ORAL

## 2024-05-15 MED ORDER — FAMOTIDINE 20 MG PO TABS
40.0000 mg | ORAL_TABLET | Freq: Every day | ORAL | Status: DC
  Administered 2024-05-15 – 2024-05-17 (×3): 40 mg via ORAL
  Filled 2024-05-15 (×3): qty 2

## 2024-05-15 MED ORDER — LIDOCAINE HCL (PF) 1 % IJ SOLN
INTRAMUSCULAR | Status: AC
  Filled 2024-05-15: qty 5

## 2024-05-15 MED ORDER — SODIUM CHLORIDE 0.9% FLUSH
10.0000 mL | Freq: Three times a day (TID) | INTRAVENOUS | Status: DC
  Administered 2024-05-15 (×2): 10 mL via INTRAPLEURAL

## 2024-05-15 MED ORDER — METRONIDAZOLE 500 MG/100ML IV SOLN
500.0000 mg | Freq: Two times a day (BID) | INTRAVENOUS | Status: DC
  Administered 2024-05-15 – 2024-05-17 (×5): 500 mg via INTRAVENOUS
  Filled 2024-05-15 (×5): qty 100

## 2024-05-15 MED ORDER — LIDOCAINE HCL (CARDIAC) PF 100 MG/5ML IV SOSY
PREFILLED_SYRINGE | INTRAVENOUS | Status: DC
Start: 2024-05-15 — End: 2024-05-15
  Filled 2024-05-15: qty 5

## 2024-05-15 MED ORDER — INSULIN ASPART 100 UNIT/ML IJ SOLN
0.0000 [IU] | Freq: Three times a day (TID) | INTRAMUSCULAR | Status: DC
  Administered 2024-05-15 (×2): 3 [IU] via SUBCUTANEOUS
  Administered 2024-05-16: 2 [IU] via SUBCUTANEOUS
  Administered 2024-05-16: 5 [IU] via SUBCUTANEOUS
  Administered 2024-05-16: 3 [IU] via SUBCUTANEOUS
  Administered 2024-05-17: 2 [IU] via SUBCUTANEOUS
  Administered 2024-05-17: 5 [IU] via SUBCUTANEOUS
  Filled 2024-05-15 (×2): qty 2

## 2024-05-15 MED ORDER — FINASTERIDE 5 MG PO TABS
5.0000 mg | ORAL_TABLET | Freq: Every day | ORAL | Status: DC
  Administered 2024-05-15 – 2024-05-17 (×3): 5 mg via ORAL
  Filled 2024-05-15 (×3): qty 1

## 2024-05-15 MED ORDER — LOSARTAN POTASSIUM 50 MG PO TABS
100.0000 mg | ORAL_TABLET | Freq: Every day | ORAL | Status: DC
  Administered 2024-05-15 – 2024-05-17 (×3): 100 mg via ORAL
  Filled 2024-05-15 (×3): qty 2

## 2024-05-15 MED ORDER — AMLODIPINE BESYLATE 5 MG PO TABS
5.0000 mg | ORAL_TABLET | Freq: Every day | ORAL | Status: DC
  Administered 2024-05-15 – 2024-05-17 (×3): 5 mg via ORAL
  Filled 2024-05-15 (×3): qty 1

## 2024-05-15 MED ORDER — LIDOCAINE HCL 1 % IJ SOLN
20.0000 mL | Freq: Once | INTRAMUSCULAR | Status: DC
  Filled 2024-05-15: qty 20

## 2024-05-15 MED ORDER — FLUTICASONE FUROATE-VILANTEROL 200-25 MCG/ACT IN AEPB
1.0000 | INHALATION_SPRAY | Freq: Every day | RESPIRATORY_TRACT | Status: DC
  Administered 2024-05-16 – 2024-05-17 (×2): 1 via RESPIRATORY_TRACT
  Filled 2024-05-15: qty 28

## 2024-05-15 MED ORDER — NAPHAZOLINE-GLYCERIN 0.012-0.25 % OP SOLN
1.0000 [drp] | Freq: Four times a day (QID) | OPHTHALMIC | Status: DC | PRN
  Filled 2024-05-15 (×2): qty 15

## 2024-05-15 MED ORDER — INSULIN ASPART 100 UNIT/ML IJ SOLN
0.0000 [IU] | INTRAMUSCULAR | Status: DC
  Administered 2024-05-15: 5 [IU] via SUBCUTANEOUS

## 2024-05-15 MED ORDER — LIDOCAINE HCL (PF) 1 % IJ SOLN
INTRAMUSCULAR | Status: AC
  Filled 2024-05-15: qty 10

## 2024-05-15 MED ORDER — SODIUM CHLORIDE 0.9 % IV SOLN
2.0000 g | INTRAVENOUS | Status: DC
  Administered 2024-05-15 – 2024-05-17 (×3): 2 g via INTRAVENOUS
  Filled 2024-05-15 (×3): qty 20

## 2024-05-15 NOTE — Plan of Care (Signed)

## 2024-05-15 NOTE — Progress Notes (Signed)
 PROGRESS NOTE    [name]  FMW:[id number] DOB: Mar 31, 1936 DOA: 05/14/2024 PCP: System, Provider Not In     Brief Narrative:  [name] is a 88 y.o. male with past medical history significant for COPD, DM, neuropathy, HLD, HTN who presents with shortness of breath and generalized weakness. He was found to have right-sided large loculated effusion    New events last 24 hours / Subjective: Underwent chest tube placement this morning by PCCM.  Patient denies any pain or discomfort today.  His main complaint is irritation in his left eye.  States that he usually uses Visine over-the-counter for dry and irritated eyes.  Also asking for nebulizer treatment  Assessment & Plan:   Principal Problem:   Pleural effusion on right Active Problems:   Diabetes mellitus type 2 with complications (HCC)   Hyponatremia   Essential hypertension   Diabetic peripheral neuropathy (HCC)   Neuropathy   Fall at home, initial encounter   Alcohol abuse   BPH (benign prostatic hyperplasia)    Right loculated pleural effusion - PCCM consulted, chest tube placed 11/1 - Cultures pending - Rocephin , Flagyl  Hypertension - Norvasc , losartan  Diabetes mellitus with hyperglycemia and neuropathy - A1c 5.7 - Sliding scale insulin  - Neurontin   Alcohol abuse - CIWA protocol - Vitamin B1, folic acid, multivitamin  BPH - Proscar   Pulmonary nodules - New right upper lobe pulmonary nodules measuring up to 1.4 cm and 5 mm; recommend non-contrast chest CT at 3 months, PET/CT, or tissue sampling per Fleischner Society Guidelines. Stable right apical solid pulmonary nodule measuring 11 mm; management as above for most suspicious nodule.   DVT prophylaxis: SCD Code Status: Full code Family Communication: Son at bedside Disposition Plan: Home Status is: Inpatient Remains inpatient appropriate because: Chest tube placed today.  On IV antibiotics    Antimicrobials:  Anti-infectives (From  admission, onward)    Start     Dose/Rate Route Frequency Ordered Stop   05/15/24 1300  cefTRIAXone  (ROCEPHIN ) 1 g in sodium chloride  0.9 % 100 mL IVPB  Status:  Discontinued        1 g 200 mL/hr over 30 Minutes Intravenous Every 24 hours 05/14/24 1553 05/15/24 0815   05/15/24 0945  cefTRIAXone  (ROCEPHIN ) 2 g in sodium chloride  0.9 % 100 mL IVPB  Status:  Discontinued        2 g 200 mL/hr over 30 Minutes Intravenous  Once 05/15/24 0846 05/15/24 0847   05/15/24 0945  metroNIDAZOLE (FLAGYL) IVPB 500 mg        500 mg 100 mL/hr over 60 Minutes Intravenous Every 12 hours 05/15/24 0846     05/15/24 0945  cefTRIAXone  (ROCEPHIN ) 2 g in sodium chloride  0.9 % 100 mL IVPB  Status:  Discontinued        2 g 200 mL/hr over 30 Minutes Intravenous  Once 05/15/24 0847 05/15/24 0856   05/15/24 0945  cefTRIAXone  (ROCEPHIN ) 2 g in sodium chloride  0.9 % 100 mL IVPB        2 g 200 mL/hr over 30 Minutes Intravenous Every 24 hours 05/15/24 0856     05/14/24 1315  cefTRIAXone  (ROCEPHIN ) 1 g in sodium chloride  0.9 % 100 mL IVPB        1 g 200 mL/hr over 30 Minutes Intravenous  Once 05/14/24 1304 05/14/24 1343   05/14/24 1315  azithromycin  (ZITHROMAX ) 500 mg in sodium chloride  0.9 % 250 mL IVPB        500 mg 250 mL/hr over  60 Minutes Intravenous  Once 05/14/24 1304 05/14/24 1450        Objective: Vitals:   05/15/24 0024 05/15/24 0437 05/15/24 0856 05/15/24 1241  BP: 116/68 122/67 (!) 149/75 126/79  Pulse: 91 91 (!) 102 93  Resp:      Temp:  97.6 F (36.4 C)  97.7 F (36.5 C)  TempSrc:    Oral  SpO2: 93% 94% 95% 96%  Weight:      Height:        Intake/Output Summary (Last 24 hours) at 05/15/2024 1412 Last data filed at 05/15/2024 1350 Gross per 24 hour  Intake 925.71 ml  Output 2450 ml  Net -1524.29 ml   Filed Weights   05/14/24 1210  Weight: 86.2 kg    Examination:  General exam: Appears calm and comfortable  Respiratory system: Clear to auscultation anteriorly. Respiratory effort  normal. No respiratory distress. No conversational dyspnea. On Nance O2 Cardiovascular system: S1 & S2 heard, RRR. No murmurs. No pedal edema. Gastrointestinal system: Abdomen is nondistended, soft and nontender. Normal bowel sounds heard. Central nervous system: Alert and oriented. No focal neurological deficits. Speech clear.  Extremities: Symmetric in appearance  Skin: No rashes, lesions or ulcers on exposed skin  Psychiatry: Judgement and insight appear normal. Mood & affect appropriate.   Data Reviewed: I have personally reviewed following labs and imaging studies  CBC: Recent Labs  Lab 05/14/24 1225 05/15/24 0555  WBC 18.6* 17.0*  NEUTROABS 16.8*  --   HGB 11.7* 12.2*  HCT 34.2* 33.9*  MCV 90.5 89.0  PLT 313 311   Basic Metabolic Panel: Recent Labs  Lab 05/14/24 1225 05/15/24 0555  NA 122* 125*  K 4.5 4.2  CL 86* 91*  CO2 21* 21*  GLUCOSE 243* 264*  BUN 12 10  CREATININE 0.62 0.65  CALCIUM  8.5* 8.7*   GFR: Estimated Creatinine Clearance: 68.4 mL/min (by C-G formula based on SCr of 0.65 mg/dL). Liver Function Tests: Recent Labs  Lab 05/14/24 1225 05/15/24 0555  AST 22 17  ALT 20 16  ALKPHOS 70 63  BILITOT 1.2 0.5  PROT 6.5 6.6  ALBUMIN 3.2* 2.9*   No results for input(s): LIPASE, AMYLASE in the last 168 hours. No results for input(s): AMMONIA in the last 168 hours. Coagulation Profile: No results for input(s): INR, PROTIME in the last 168 hours. Cardiac Enzymes: No results for input(s): CKTOTAL, CKMB, CKMBINDEX, TROPONINI in the last 168 hours. BNP (last 3 results) No results for input(s): PROBNP in the last 8760 hours. HbA1C: Recent Labs    05/15/24 0850  HGBA1C 5.7*   CBG: Recent Labs  Lab 05/15/24 1043 05/15/24 1245  GLUCAP 261* 255*   Lipid Profile: No results for input(s): CHOL, HDL, LDLCALC, TRIG, CHOLHDL, LDLDIRECT in the last 72 hours. Thyroid  Function Tests: Recent Labs    05/14/24 2023  TSH 0.874   FREET4 0.76   Anemia Panel: Recent Labs    05/14/24 1603  VITAMINB12 862  FOLATE 15.1  FERRITIN 80  TIBC 295  IRON 39*  RETICCTPCT 2.0   Sepsis Labs: Recent Labs  Lab 05/14/24 1603 05/14/24 1624  PROCALCITON 3.63  --   LATICACIDVEN  --  1.7    Recent Results (from the past 240 hours)  Resp panel by RT-PCR (RSV, Flu A&B, Covid) Anterior Nasal Swab     Status: None   Collection Time: 05/14/24 12:17 PM   Specimen: Anterior Nasal Swab  Result Value Ref Range Status   SARS Coronavirus 2  by RT PCR NEGATIVE NEGATIVE Final   Influenza A by PCR NEGATIVE NEGATIVE Final   Influenza B by PCR NEGATIVE NEGATIVE Final    Comment: (NOTE) The Xpert Xpress SARS-CoV-2/FLU/RSV plus assay is intended as an aid in the diagnosis of influenza from Nasopharyngeal swab specimens and should not be used as a sole basis for treatment. Nasal washings and aspirates are unacceptable for Xpert Xpress SARS-CoV-2/FLU/RSV testing.  Fact Sheet for Patients: bloggercourse.com  Fact Sheet for Healthcare Providers: seriousbroker.it  This test is not yet approved or cleared by the [place]  FDA and has been authorized for detection and/or diagnosis of SARS-CoV-2 by FDA under an Emergency Use Authorization (EUA). This EUA will remain in effect (meaning this test can be used) for the duration of the COVID-19 declaration under Section 564(b)(1) of the Act, 21 U.S.C. section 360bbb-3(b)(1), unless the authorization is terminated or revoked.     Resp Syncytial Virus by PCR NEGATIVE NEGATIVE Final    Comment: (NOTE) Fact Sheet for Patients: bloggercourse.com  Fact Sheet for Healthcare Providers: seriousbroker.it  This test is not yet approved or cleared by the [place]  FDA and has been authorized for detection and/or diagnosis of SARS-CoV-2 by FDA under an Emergency Use Authorization (EUA).  This EUA will remain in effect (meaning this test can be used) for the duration of the COVID-19 declaration under Section 564(b)(1) of the Act, 21 U.S.C. section 360bbb-3(b)(1), unless the authorization is terminated or revoked.  Performed at [facility], [address] [place], [place] [address]       Radiology Studies: DG CHEST PORT 1 VIEW Result Date: 05/15/2024 CLINICAL DATA:  Pleural effusion EXAM: PORTABLE CHEST 1 VIEW COMPARISON:  05/14/2024 FINDINGS: Single frontal view of the chest demonstrates stable enlargement of the cardiac silhouette. Continued ectasia and atherosclerosis of the thoracic aorta. Interval placement of a pigtail pleural drainage catheter, coiled over the right lower hemithorax. There is marked reduction in the multilocular right pleural effusion seen previously, with a small amount of fluid remaining within the major fissure. There is a trace right basilar pneumothorax likely secondary to chest tube placement. Right basilar consolidation consistent with atelectasis. Left chest is clear. No acute bony abnormalities. IMPRESSION: 1. Near complete resolution of the multilocular right pleural effusion after right pigtail pleural drainage catheter placement. Small amount of residual fluid within the major fissure. 2. Trace right sub pulmonic pneumothorax, likely secondary to chest tube placement. No tension effect or midline shift. 3. Right basilar consolidation consistent with atelectasis. These results will be called to the ordering clinician or representative by the Radiologist Assistant, and communication documented in the PACS or [employer]. Electronically Signed   By: Ozell Daring M.D.   On: 05/15/2024 12:49   CT Angio Chest PE W and/or Wo Contrast Result Date: 05/14/2024 EXAM: CTA of the Chest with contrast for PE 05/14/2024 05:45:00 PM TECHNIQUE: CTA of the chest was performed after the administration of intravenous contrast (iohexol  (OMNIPAQUE ) 350  MG/ML injection 75 mL IOHEXOL  350 MG/ML SOLN). Multiplanar reformatted images are provided for review. MIP images are provided for review. Automated exposure control, iterative reconstruction, and/or weight based adjustment of the mA/kV was utilized to reduce the radiation dose to as low as reasonably achievable. COMPARISON: 05/14/2023 CLINICAL HISTORY: Pulmonary embolism (PE) suspected, high prob. Shortness of breath FINDINGS: PULMONARY ARTERIES: Pulmonary arteries are adequately opacified for evaluation. No pulmonary embolism. Main pulmonary artery is normal in caliber. MEDIASTINUM: Extensive 3-vessel coronary artery disease. The heart and pericardium  demonstrate no acute abnormality. Scattered aortic atherosclerosis. There is no acute abnormality of the thoracic aorta. LYMPH NODES: Mildly prominent mediastinal lymph nodes are stable, likely reactive. No hilar or axillary adenopathy. LUNGS AND PLEURA: Right apical nodule measures 11 mm compared to 13 mm previously, not significantly changed. New right upper lobe pulmonary nodule on image 29 measures up to 1.4 cm. New right upper lobe nodule measures 5 mm on image 23. Large loculated right pneumothorax. Compressive atelectasis in the right middle lobe and right lower lobe. Airway thickening noted in the left lower lobe with associated left lower lobe atelectasis. No pleural effusion. UPPER ABDOMEN: Limited images of the upper abdomen are unremarkable. SOFT TISSUES AND BONES: Chronic moderate compression fractures at T8 and L1, stable. No acute bony abnormality. No acute soft tissue abnormality. IMPRESSION: 1. No evidence of pulmonary embolism. 2. Large loculated right pneumothorax with compressive atelectasis in the right middle and lower lobes. 3. New right upper lobe pulmonary nodules measuring up to 1.4 cm and 5 mm; recommend non-contrast chest CT at 3 months, PET/CT, or tissue sampling per Fleischner Society Guidelines. 4. Stable right apical solid pulmonary  nodule measuring 11 mm; management as above for most suspicious nodule. Electronically signed by: Franky Crease MD 05/14/2024 05:54 PM EDT RP Workstation: [id number]   DG Chest Port 1 View Result Date: 05/14/2024 EXAM: 1 VIEW(S) XRAY OF THE CHEST 05/14/2024 12:36:07 PM COMPARISON: Comparison 01/05/2024. CLINICAL HISTORY: SOB SOB. FINDINGS: LUNGS AND PLEURA: Moderate-sized right pleural effusion is noted with associated right basilar atelectasis or infiltrate. Minimal left basilar subsegmental atelectasis is noted. No pulmonary edema. No pneumothorax. HEART AND MEDIASTINUM: No acute abnormality of the cardiac and mediastinal silhouettes. BONES AND SOFT TISSUES: No acute osseous abnormality. IMPRESSION: 1. Moderate right pleural effusion with associated right basilar atelectasis or infiltrate. 2. Minimal left basilar subsegmental atelectasis. Electronically signed by: Lynwood Seip MD 05/14/2024 12:55 PM EDT RP Workstation: [id number]      Scheduled Meds:  amLODipine   5 mg Oral Daily   famotidine   40 mg Oral Daily   feeding supplement  237 mL Oral TID BM   finasteride   5 mg Oral Daily   fluticasone furoate-vilanterol  1 puff Inhalation Daily   folic acid  1 mg Oral Daily   gabapentin   300 mg Oral BID   insulin  aspart  0-9 Units Subcutaneous TID WC   ipratropium-albuterol   3 mL Nebulization Q6H   lidocaine (PF)       lidocaine (PF)       lidocaine  20 mL Infiltration Once   losartan  100 mg Oral Daily   multivitamin with minerals  1 tablet Oral Daily   predniSONE   40 mg Oral Q breakfast   sodium chloride  flush  10 mL Intrapleural Q8H   sodium chloride  flush  3 mL Intravenous Q12H   thiamine  100 mg Oral Daily   Or   thiamine  100 mg Intravenous Daily   Continuous Infusions:  cefTRIAXone  (ROCEPHIN )  IV Stopped (05/15/24 1141)   metronidazole Stopped (05/15/24 1131)     LOS: 1 day   Time spent: 35 minutes   Delon Hoe, DO [facility] 05/15/2024, 2:12 PM   Available via  Epic secure chat 7am-7pm After these hours, please refer to coverage provider listed on [email or web address]

## 2024-05-15 NOTE — Procedures (Signed)
 Insertion of Chest Tube Procedure Note  Zaid Tomes  [id number]  October 13, 1935  Date:05/15/24  Time:9:47 AM    Provider Performing: Zola SAILOR Arriona Prest   Procedure: Pleural Catheter Insertion w/ Imaging Guidance ([id number])  Indication(s) Effusion  Consent Risks of the procedure as well as the alternatives and risks of each were explained to the patient and/or caregiver.  Consent for the procedure was obtained and is signed in the bedside chart  Anesthesia Topical only with 1% lidocaine    Time Out Verified patient identification, verified procedure, site/side was marked, verified correct patient position, special equipment/implants available, medications/allergies/relevant history reviewed, required imaging and test results available.   Sterile Technique Maximal sterile technique including full sterile barrier drape, hand hygiene, sterile gown, sterile gloves, mask, hair covering, sterile ultrasound probe cover (if used).   Procedure Description Ultrasound used to identify appropriate pleural anatomy for placement and overlying skin marked. Area of placement cleaned and draped in sterile fashion.  A 14 French pigtail pleural catheter was placed into the right pleural space using Seldinger technique. Appropriate return of fluid was obtained.  The tube was connected to atrium and placed on -20 cm H2O wall suction.   Complications/Tolerance None; patient tolerated the procedure well. Chest X-ray is ordered to verify placement.   EBL Minimal  Specimen(s) none

## 2024-05-15 NOTE — Consult Note (Signed)
 NAME:  [name], MRN:  [id number], DOB:  1935/11/17, LOS: 1 ADMISSION DATE:  05/14/2024, CONSULTATION DATE:  05/15/24 REFERRING MD:  Dr Rojelio, CHIEF COMPLAINT:  Shortness of breath   History of Present Illness:  88 year old male with a past medical history of reported asthma/COPD, GERD, hypertension, diabetes, possible rheumatoid arthritis on methotrexate who presented from his assisted living facility with what he reports as an asthma episode for the past 3 days prior to his presentation.  He feels short of breath and weak.  He reports very soft fall from his bed and appeared somewhat disoriented so he was brought into the ER.  In the ER he was noted to be hypoxic satting 80% on room air and was given steroids and nebulized albuterol .  Laboratory workup in the ER showed leukocytosis with a white count of 18.6, hyponatremia with a sodium of 125.  CT chest PE protocol with no evidence of PE but showed right loculated pleural effusion concerning for empyema.  Pulmonary was consulted for further evaluation  Pertinent  Medical History  As mentioned above  Significant Hospital Events: Including procedures, antibiotic start and stop dates in addition to other pertinent events     Interim History / Subjective:  N/a  Objective    Blood pressure 122/67, pulse 91, temperature 97.6 F (36.4 C), resp. rate 18, height 5' 10 (1.778 m), weight 86.2 kg, SpO2 94%.    FiO2 (%):  [32 %] 32 %   Intake/Output Summary (Last 24 hours) at 05/15/2024 0806 Last data filed at 05/15/2024 0000 Gross per 24 hour  Intake 725.71 ml  Output 1050 ml  Net -324.29 ml   Filed Weights   05/14/24 1210  Weight: 86.2 kg    Examination: General: Elderly male not in acute distress HENT: Atraumatic, normocephalic Lungs: Decreased air entry on the right side Cardiovascular: Regular rate and rhythm, normal S1, normal S2 Abdomen: Soft, nontender, nondistended Extremities: Warm, well-perfused Neuro: Alert and  oriented x 3, motor and sensation grossly intact  Bedside ultrasound of the right pleural space with fluid with few septations and tethering to the diaphragm    Resolved problem list   Assessment and Plan  # Acute hypoxic respiratory failure # Loculated pleural effusion likely empyema versus parapneumonic effusion  Discussed with patient chest tube placement for fluid drainage and testing.  Patient is in agreement with proceeding with pigtail placement.  Discussed risks and benefits with patient.  In the meantime he should be covered with antibiotics with anaerobic coverage.  I will switch his ceftriaxone  to Unasyn.   Will need to monitor fluid drainage, can consider lytics if fluid pocket not completely drained.  Discussed with patient differential diagnosis including empyema, hemothorax in the setting of fall, malignant pleural effusion.       Labs   CBC: Recent Labs  Lab 05/14/24 1225 05/15/24 0555  WBC 18.6* 17.0*  NEUTROABS 16.8*  --   HGB 11.7* 12.2*  HCT 34.2* 33.9*  MCV 90.5 89.0  PLT 313 311    Basic Metabolic Panel: Recent Labs  Lab 05/14/24 1225 05/15/24 0555  NA 122* 125*  K 4.5 4.2  CL 86* 91*  CO2 21* 21*  GLUCOSE 243* 264*  BUN 12 10  CREATININE 0.62 0.65  CALCIUM  8.5* 8.7*   GFR: Estimated Creatinine Clearance: 68.4 mL/min (by C-G formula based on SCr of 0.65 mg/dL). Recent Labs  Lab 05/14/24 1225 05/14/24 1603 05/14/24 1624 05/15/24 0555  PROCALCITON  --  3.63  --   --  WBC 18.6*  --   --  17.0*  LATICACIDVEN  --   --  1.7  --     Liver Function Tests: Recent Labs  Lab 05/14/24 1225 05/15/24 0555  AST 22 17  ALT 20 16  ALKPHOS 70 63  BILITOT 1.2 0.5  PROT 6.5 6.6  ALBUMIN 3.2* 2.9*   No results for input(s): LIPASE, AMYLASE in the last 168 hours. No results for input(s): AMMONIA in the last 168 hours.  ABG No results found for: PHART, PCO2ART, PO2ART, HCO3, TCO2, ACIDBASEDEF, O2SAT   Coagulation  Profile: No results for input(s): INR, PROTIME in the last 168 hours.  Cardiac Enzymes: No results for input(s): CKTOTAL, CKMB, CKMBINDEX, TROPONINI in the last 168 hours.  HbA1C: Hgb A1c MFr Bld  Date/Time Value Ref Range Status  09/07/2014 07:10 PM 8.5 (H) 4.8 - 5.6 % Final    Comment:    (NOTE)         Pre-diabetes: 5.7 - 6.4         Diabetes: >6.4         Glycemic control for adults with diabetes: <7.0   04/06/2014 03:27 AM 6.8 (H) <5.7 % Final    Comment:    (NOTE)                                                                       According to the ADA Clinical Practice Recommendations for 2011, when HbA1c is used as a screening test:  >=6.5%   Diagnostic of Diabetes Mellitus           (if abnormal result is confirmed) 5.7-6.4%   Increased risk of developing Diabetes Mellitus References:Diagnosis and Classification of Diabetes Mellitus,Diabetes Care,2011,34(Suppl 1):S62-S69 and Standards of Medical Care in         Diabetes - 2011,Diabetes Care,2011,34 (Suppl 1):S11-S61.    CBG: No results for input(s): GLUCAP in the last 168 hours.  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   Surgical History:  History reviewed. No pertinent surgical history.   Social History:   reports that he quit smoking about 10 years ago. His smoking use included cigarettes. He started smoking about 70 years ago. He has a 30 pack-year smoking history. He has never used smokeless tobacco. He reports current alcohol use of about  14.0 standard drinks of alcohol per week. He reports that he does not use drugs.   Family History:  His family history includes Stroke in his father.   Allergies Allergies  Allergen Reactions   Lisinopril  Cough   Other Other (See Comments)   Pneumococcal Vac Polyvalent Other (See Comments)    Fever, sweats   Prednisone  Other (See Comments)    Sweating with high dose   Prevnar 13 [Pneumococcal 13-Val Conj Vacc] Swelling   Penicillins Rash   Sulfa Antibiotics Rash     Home Medications  Prior to Admission medications  Medication Sig Start Date End Date Taking? Authorizing Provider  albuterol  (PROVENTIL  HFA;VENTOLIN  HFA) 108 (90 BASE) MCG/ACT inhaler Inhale 2 puffs into the lungs every 6 (six) hours as needed for wheezing or shortness of breath.   Yes [provider]  amLODipine  (NORVASC ) 5 MG tablet Take 5 mg by mouth daily.   Yes [provider]  Ascorbic Acid  (VITAMIN C ) 1000 MG tablet Take 1,000 mg by mouth daily.   Yes [provider]  cetirizine (ZYRTEC) 10 MG tablet Take 10 mg by mouth at bedtime.   Yes [provider]  Ergocalciferol (VITAMIN D2) 50 MCG (2000 UT) TABS Take 1 tablet by mouth in the morning.   Yes [provider]  famotidine  (PEPCID ) 40 MG tablet Take 40 mg by mouth daily.   Yes [provider]  finasteride  (PROSCAR ) 5 MG tablet Take 5 mg by mouth daily.   Yes [provider]  fluticasone-salmeterol (ADVAIR) 250-50 MCG/ACT AEPB Inhale 1 puff into the lungs in the morning and at bedtime. 05/05/24  Yes [provider]  folic acid (FOLVITE) 1 MG tablet Take 1 mg by mouth daily.   Yes [provider]  gabapentin  (NEURONTIN ) 300 MG capsule Take 300 mg by mouth 2 (two) times daily.   Yes [provider]  glipiZIDE  (GLUCOTROL  XL) 10 MG 24 hr tablet Take 10 mg by mouth daily. 01/23/20  Yes [provider]  ipratropium-albuterol  (DUONEB) 0.5-2.5 (3) MG/3ML SOLN Take 3 mLs by  nebulization every 4 (four) hours as needed for up to 30 doses. 01/05/24  Yes Jerral Meth, MD  losartan (COZAAR) 100 MG tablet Take 100 mg by mouth daily. 03/20/19  Yes [provider]  metFORMIN  (GLUCOPHAGE ) 500 MG tablet Take 1 tablet (500 mg total) by mouth 2 (two) times daily with a meal. Resume tonight. Patient taking differently: Take 500-1,000 mg by mouth See admin instructions. Take 1 tablet (500mg ) by mouth every morning and then take 2 tablets (1000mg ) by mouth every evening. 09/09/14  Yes Krishnan, Gokul, MD  methotrexate 2.5 MG tablet Take 17.5 mg by mouth once a week. Take 7 tablets orally once a week on Tuesdays.   Yes [provider]  Multiple Vitamin (MULTIVITAMIN WITH MINERALS) TABS Take 1 tablet by mouth daily.   Yes [provider]  silodosin (RAPAFLO) 8 MG CAPS capsule Take 8 mg by mouth daily. 02/26/24  Yes [provider]  sodium chloride  1 g tablet Take 1 g by mouth 3 (three) times daily.   Yes [provider]  Zinc Sulfate 220 (50 Zn) MG TABS Take 220 mg by mouth daily.   Yes [provider]  ONE TOUCH ULTRA TEST test strip  09/28/15   [provider]  AISHA PASTOR LANCETS 33G MISC  09/28/15   [provider]

## 2024-05-15 NOTE — Plan of Care (Signed)
  Problem: Education: Goal: Knowledge of General Education information will improve Description: Including pain rating scale, medication(s)/side effects and non-pharmacologic comfort measures Outcome: Progressing   Problem: Health Behavior/Discharge Planning: Goal: Ability to manage health-related needs will improve Outcome: Progressing   Problem: Clinical Measurements: Goal: Ability to maintain clinical measurements within normal limits will improve Outcome: Progressing Goal: Will remain free from infection Outcome: Progressing Goal: Diagnostic test results will improve Outcome: Progressing Goal: Respiratory complications will improve Outcome: Progressing Goal: Cardiovascular complication will be avoided Outcome: Progressing   Problem: Activity: Goal: Risk for activity intolerance will decrease Outcome: Progressing   Problem: Pain Managment: Goal: General experience of comfort will improve and/or be controlled Outcome: Progressing   Problem: Safety: Goal: Ability to remain free from injury will improve Outcome: Progressing   Problem: Skin Integrity: Goal: Risk for impaired skin integrity will decrease Outcome: Progressing   Problem: Respiratory: Goal: Ability to maintain a clear airway will improve Outcome: Progressing Goal: Levels of oxygenation will improve Outcome: Progressing Goal: Ability to maintain adequate ventilation will improve Outcome: Progressing   Problem: Metabolic: Goal: Ability to maintain appropriate glucose levels will improve Outcome: Progressing   Problem: Tissue Perfusion: Goal: Adequacy of tissue perfusion will improve Outcome: Progressing

## 2024-05-15 NOTE — Discharge Instructions (Signed)

## 2024-05-15 NOTE — Evaluation (Signed)
 Physical Therapy Evaluation Patient Details Name: [name] MRN: [id number] DOB: [birth year] Today's Date: 05/15/2024  History of Present Illness  Pt is 88 year old presented to [facility] on  05/14/24 for fall and SOB. Found to have rt loculated pleural effusion. Rt chest tube placed.  PMH - copd, dm, peripheral neuropathy, sdh,  Clinical Impression  Pt admitted with above diagnosis and presents to PT with functional limitations due to deficits listed below (See PT problem list). Pt needs skilled PT to maximize independence and safety. Pt lives with elderly spouse at [facility]. Unsure if they are in independent living or assisted living. Pt able to amb short distance in room with assistance but do not feel he is at the level to return home with his elderly spouse. Patient will benefit from continued inpatient follow up therapy, <3 hours/day in order to return to a level at which he could return home.           If plan is discharge home, recommend the following: A little help with walking and/or transfers;A little help with bathing/dressing/bathroom;Assist for transportation   Can travel by private vehicle   Yes    Equipment Recommendations None recommended by PT  Recommendations for Other Services       Functional Status Assessment Patient has had a recent decline in their functional status and demonstrates the ability to make significant improvements in function in a reasonable and predictable amount of time.     Precautions / Restrictions Precautions Precautions: Fall;Other (comment) Recall of Precautions/Restrictions: Impaired Precaution/Restrictions Comments: chest tube Restrictions Weight Bearing Restrictions Per Provider Order: No      Mobility  Bed Mobility Overal bed mobility: Needs Assistance Bed Mobility: Supine to Sit, Sit to Supine     Supine to sit: Min assist, HOB elevated Sit to supine: Contact guard assist   General bed mobility comments: Assist to  bring legs off and elevate trunk.    Transfers Overall transfer level: Needs assistance Equipment used: Rollator (4 wheels) Transfers: Sit to/from Stand Sit to Stand: Min assist           General transfer comment: Assist to power up and stabilize    Ambulation/Gait Ambulation/Gait assistance: Min assist Gait Distance (Feet): 20 Feet Assistive device: Rollator (4 wheels) Gait Pattern/deviations: Step-through pattern, Decreased step length - right, Decreased step length - left, Decreased stride length, Trunk flexed Gait velocity: decr Gait velocity interpretation: <1.31 ft/sec, indicative of household ambulator   General Gait Details: Assist for balance and support  Stairs            Wheelchair Mobility     Tilt Bed    Modified Rankin (Stroke Patients Only)       Balance Overall balance assessment: Needs assistance Sitting-balance support: No upper extremity supported, Feet supported Sitting balance-Leahy Scale: Good     Standing balance support: Bilateral upper extremity supported, Single extremity supported, During functional activity Standing balance-Leahy Scale: Poor Standing balance comment: UE support and CGA for static standing                             Pertinent Vitals/Pain Pain Assessment Pain Assessment: Faces Faces Pain Scale: Hurts a little bit Pain Location: rt chest tube Pain Descriptors / Indicators: Grimacing, Guarding Pain Intervention(s): Limited activity within patient's tolerance, Monitored during session, Repositioned    Home Living Family/patient expects to be discharged to:: Private residence Living Arrangements: Spouse/significant other Available Help at Discharge:  Family;Available PRN/intermittently Type of Home:  (Not sure if they are in ILF or ALF) Home Access: Level entry       Home Layout: One level Home Equipment: Rollator (4 wheels);Cane - single point      Prior Function                Mobility Comments: Pt reports he amb in apt with cane and uses rollator if out.       Extremity/Trunk Assessment   Upper Extremity Assessment Upper Extremity Assessment: Defer to OT evaluation    Lower Extremity Assessment Lower Extremity Assessment: Generalized weakness       Communication   Communication Communication: Impaired Factors Affecting Communication: Hearing impaired    Cognition Arousal: Alert Behavior During Therapy: WFL for tasks assessed/performed   PT - Cognitive impairments: No family/caregiver present to determine baseline, Memory                         Following commands: Intact       Cueing Cueing Techniques: Tactile cues     General Comments General comments (skin integrity, edema, etc.): Pt on 4L o2    Exercises     Assessment/Plan    PT Assessment Patient needs continued PT services  PT Problem List Decreased strength;Decreased activity tolerance;Decreased mobility;Decreased balance       PT Treatment Interventions DME instruction;Functional mobility training;Balance training;Patient/family education;Gait training;Therapeutic activities;Therapeutic exercise    PT Goals (Current goals can be found in the Care Plan section)  Acute Rehab PT Goals Patient Stated Goal: return home PT Goal Formulation: With patient Time For Goal Achievement: 05/29/24 Potential to Achieve Goals: Good    Frequency Min 2X/week     Co-evaluation               AM-PAC PT 6 Clicks Mobility  Outcome Measure Help needed turning from your back to your side while in a flat bed without using bedrails?: A Little Help needed moving from lying on your back to sitting on the side of a flat bed without using bedrails?: A Little Help needed moving to and from a bed to a chair (including a wheelchair)?: A Little Help needed standing up from a chair using your arms (e.g., wheelchair or bedside chair)?: A Little Help needed to walk in hospital  room?: A Little Help needed climbing 3-5 steps with a railing? : Total 6 Click Score: 16    End of Session Equipment Utilized During Treatment: Gait belt;Oxygen Activity Tolerance: Patient tolerated treatment well Patient left: in bed;with call bell/phone within reach;with bed alarm set Nurse Communication: Mobility status PT Visit Diagnosis: Unsteadiness on feet (R26.81);Muscle weakness (generalized) (M62.81);History of falling (Z91.81);Difficulty in walking, not elsewhere classified (R26.2)    Time: 1458-1530 PT Time Calculation (min) (ACUTE ONLY): 32 min   Charges:   PT Evaluation $PT Eval Moderate Complexity: 1 Mod PT Treatments $Gait Training: 8-22 mins PT General Charges $$ ACUTE PT VISIT: 1 Visit         [facility] Acute Rehabilitation Services Office [phone]   Rodgers ORN [facility] 05/15/2024, 5:09 PM

## 2024-05-15 NOTE — Progress Notes (Signed)
 Initial Nutrition Assessment  DOCUMENTATION CODES:   Not applicable  INTERVENTION:   Ensure Plus High Protein po TID, each supplement provides 350 kcal and 20 grams of protein MVI with minerals daily Diabetes education handouts added to discharge summary  NUTRITION DIAGNOSIS:   Increased nutrient needs related to chronic illness, acute illness as evidenced by estimated needs.  GOAL:   Patient will meet greater than or equal to 90% of their needs  MONITOR:   PO intake, Supplement acceptance  REASON FOR ASSESSMENT:   Consult Other (Comment) (nutrition goals)  ASSESSMENT:   88 yo male admitted with generalized weakness and SOB s/p fall. PMH includes COPD, DM, HTN, iliac artery aneurysm, peripheral neuropathy, HLD, SDH, macular degeneration, degenerative cervical disc, former tobacco use, alcohol abuse.  Patient found to have loculated pleural effusion, empyema vs parapneumonic effusion. S/P chest tube placement this morning for fluid drainage and testing.  Weight history reviewed. 5% weight loss within the past year is not significant.    Currently on a carb modified diet.  Meal intakes: 100% of dinner 10/31  Patient did not order breakfast this morning and only ordered soup and crackers for lunch. He would benefit from a nutrition supplement to maximize oral intake of protein and calories.  Labs reviewed.  Na 125 Albumin 2.9 (low albumin is reflective of acute inflammatory process and is not a good reflector of nutrition status) CBG: 261  Medications reviewed and include pepcid , folic acid, novolog , MVI, prednisone , thiamine, IV rocephin , IV flagyl.  NUTRITION - FOCUSED PHYSICAL EXAM:  Unable to complete  Diet Order:   Diet Order             Diet Carb Modified Fluid consistency: Thin; Room service appropriate? Yes  Diet effective now                   EDUCATION NEEDS:   Education needs have been addressed  Skin:  Skin Assessment: Reviewed RN  Assessment  Last BM:  10/30  Height:   Ht Readings from Last 1 Encounters:  05/14/24 5' 10 (1.778 m)    Weight:   Wt Readings from Last 1 Encounters:  05/14/24 86.2 kg    Ideal Body Weight:  75.5 kg  BMI:  Body mass index is 27.26 kg/m.  Estimated Nutritional Needs:   Kcal:  1950-2150  Protein:  100-120 gm  Fluid:  1.9-2.1 L   Suzen HUNT RD, LDN, CNSC Contact via secure chat. If unavailable, use group chat RD Inpatient.

## 2024-05-16 ENCOUNTER — Inpatient Hospital Stay (HOSPITAL_COMMUNITY)

## 2024-05-16 DIAGNOSIS — J9811 Atelectasis: Secondary | ICD-10-CM | POA: Diagnosis not present

## 2024-05-16 DIAGNOSIS — I700 Atherosclerosis of aorta: Secondary | ICD-10-CM | POA: Diagnosis not present

## 2024-05-16 DIAGNOSIS — J939 Pneumothorax, unspecified: Secondary | ICD-10-CM | POA: Diagnosis not present

## 2024-05-16 DIAGNOSIS — R918 Other nonspecific abnormal finding of lung field: Secondary | ICD-10-CM | POA: Diagnosis not present

## 2024-05-16 DIAGNOSIS — J90 Pleural effusion, not elsewhere classified: Secondary | ICD-10-CM | POA: Diagnosis not present

## 2024-05-16 DIAGNOSIS — Z4682 Encounter for fitting and adjustment of non-vascular catheter: Secondary | ICD-10-CM | POA: Diagnosis not present

## 2024-05-16 LAB — CBC
HCT: 34.9 % — ABNORMAL LOW (ref 39.0–52.0)
Hemoglobin: 12.5 g/dL — ABNORMAL LOW (ref 13.0–17.0)
MCH: 31.8 pg (ref 26.0–34.0)
MCHC: 35.8 g/dL (ref 30.0–36.0)
MCV: 88.8 fL (ref 80.0–100.0)
Platelets: 378 K/uL (ref 150–400)
RBC: 3.93 MIL/uL — ABNORMAL LOW (ref 4.22–5.81)
RDW: 14.4 % (ref 11.5–15.5)
WBC: 15.6 K/uL — ABNORMAL HIGH (ref 4.0–10.5)
nRBC: 0 % (ref 0.0–0.2)

## 2024-05-16 LAB — BASIC METABOLIC PANEL WITH GFR
Anion gap: 13 (ref 5–15)
BUN: 12 mg/dL (ref 8–23)
CO2: 24 mmol/L (ref 22–32)
Calcium: 8.6 mg/dL — ABNORMAL LOW (ref 8.9–10.3)
Chloride: 91 mmol/L — ABNORMAL LOW (ref 98–111)
Creatinine, Ser: 0.5 mg/dL — ABNORMAL LOW (ref 0.61–1.24)
GFR, Estimated: >60 mL/min (ref >60)
Glucose, Bld: 201 mg/dL — ABNORMAL HIGH (ref 70–99)
Potassium: 3.9 mmol/L (ref 3.5–5.1)
Sodium: 128 mmol/L — ABNORMAL LOW (ref 135–145)

## 2024-05-16 LAB — GLUCOSE, CAPILLARY
Glucose-Capillary: 182 mg/dL — ABNORMAL HIGH (ref 70–99)
Glucose-Capillary: 262 mg/dL — ABNORMAL HIGH (ref 70–99)
Glucose-Capillary: 235 mg/dL — ABNORMAL HIGH (ref 70–99)
Glucose-Capillary: 221 mg/dL — ABNORMAL HIGH (ref 70–99)

## 2024-05-16 MED ORDER — INSULIN ASPART 100 UNIT/ML IJ SOLN
0.0000 [IU] | Freq: Every day | INTRAMUSCULAR | Status: DC
  Administered 2024-05-16: 2 [IU] via SUBCUTANEOUS
  Administered 2024-05-16: 3 [IU] via SUBCUTANEOUS
  Filled 2024-05-16: qty 2
  Filled 2024-05-16: qty 3

## 2024-05-16 MED ORDER — INSULIN ASPART 100 UNIT/ML IJ SOLN
3.0000 [IU] | Freq: Three times a day (TID) | INTRAMUSCULAR | Status: DC
  Administered 2024-05-16 – 2024-05-17 (×4): 3 [IU] via SUBCUTANEOUS

## 2024-05-16 NOTE — TOC Initial Note (Signed)
 Transition of Care [facility]) - Initial/Assessment Note    Patient Details  Name: [name] MRN: [id number] Date of Birth: [birth year]  Transition of Care [facility]) CM/SW Contact:    Almarie CHRISTELLA Goodie, LCSW Phone Number: 05/16/2024, 11:46 AM  Clinical Narrative:        CSW met with patient to discuss recommendation for SNF. Patient initially in agreement, saying that he would like to do rehab, but upon further discussion realized that he wants to be able to do [facility]. After CSW clarified SNF recommendation, but reported that he really wants to return back to [place] with his wife if he is able; if he is not, then he would be agreeable to SNF if he has to. CSW discussed contacting [facility] staff tomorrow (no one is available today) to discuss patient's functioning and if they feel he can return or would also recommend SNF, and patient in agreement with plan. CSW to send therapy updates to [facility] tomorrow and will follow up with patient. CSW to follow.           Expected Discharge Plan: Assisted Living Barriers to Discharge: Continued Medical Work up   Patient Goals and CMS Choice Patient states their goals for this hospitalization and ongoing recovery are:: to get back to [place], if able CMS Medicare.gov Compare Post Acute Care list provided to:: Patient Choice offered to / list presented to : Patient      Expected Discharge Plan and Services     Post Acute Care Choice: Home Health Living arrangements for the past 2 months: Assisted Living Facility                                      Prior Living Arrangements/Services Living arrangements for the past 2 months: Assisted Living Facility Lives with:: Facility Resident, Spouse Patient language and need for interpreter reviewed:: No Do you feel safe going back to the place where you live?: Yes      Need for Family Participation in Patient Care: No (Comment) Care giver support system in place?: Yes (comment) Current  home services: DME Criminal Activity/Legal Involvement Pertinent to Current Situation/Hospitalization: No - Comment as needed  Activities of Daily Living      Permission Sought/Granted Permission sought to share information with : Oceanographer granted to share information with : Yes, Verbal Permission Granted     Permission granted to share info w AGENCY: [facility]        Emotional Assessment Appearance:: Appears stated age Attitude/Demeanor/Rapport: Engaged Affect (typically observed): Appropriate Orientation: : Oriented to Self, Oriented to Place, Oriented to  Time, Oriented to Situation Alcohol / Substance Use: Not Applicable Psych Involvement: No (comment)  Admission diagnosis:  Fall at home, initial encounter [W19.CHERENE, Y92.009] Community acquired pneumonia of right lower lobe of lung [J18.9] Patient Active Problem List   Diagnosis Date Noted   Pleural effusion on right 05/15/2024   Alcohol abuse 05/15/2024   BPH (benign prostatic hyperplasia) 05/15/2024   Fall at home, initial encounter 05/14/2024   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   Neuropathy 03/12/2021   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/12/2021   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   Methotrexate, long term, current use 03/12/2021   Neuropathy associated with endocrine disorder 11/01/2018   Tension-type headache, not intractable 04/14/2018   Diabetic peripheral neuropathy (HCC) 02/25/2018   Upper airway  cough syndrome 04/20/2015   Essential hypertension 09/13/2014   COPD GOLD II 09/07/2014   Leukocytosis 09/07/2014   Anemia 09/07/2014   Chest tightness 08/26/2014   Diabetes mellitus type 2 with complications (HCC) 04/06/2014   Chronic obstructive pulmonary disease with acute exacerbation (HCC) 04/06/2014   Hyponatremia 04/06/2014   Hyperlipidemia 04/06/2014   CAP (community acquired pneumonia)  04/05/2014   Balance problem 02/15/2014   Benign paroxysmal positional vertigo 02/15/2014   Chronic tension-type headache, not intractable 02/15/2014   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 [place] [address] Phone: [phone] Fax: [phone]  [facility] [address] [place] [address] Phone: [phone] Fax: [phone]     Social Drivers of Health (SDOH) Social History: SDOH Screenings   Food Insecurity: No Food Insecurity (05/14/2024)  Housing: Low Risk  (05/14/2024)  Transportation Needs: No Transportation Needs (05/14/2024)  Utilities: Not At Risk (05/14/2024)  Social Connections: Socially Integrated (05/14/2024)  Tobacco Use: Medium Risk (05/14/2024)   SDOH Interventions:     Readmission Risk Interventions     No data to display

## 2024-05-16 NOTE — Progress Notes (Signed)
 PROGRESS NOTE    [name]  FMW:[id number] DOB: 1935/07/30 DOA: 05/14/2024 PCP: System, Provider Not In     Brief Narrative:  [name] is a 88 y.o. male with past medical history significant for COPD, DM, neuropathy, HLD, HTN who presents with shortness of breath and generalized weakness. He was found to have right-sided large loculated effusion.  He had chest tube placed by Dr. Zaida 11/1.  New events last 24 hours / Subjective: Overnight, he states that he woke up and got really confused and did not know where he was.  He pulled out his chest tube.  This morning, patient is completely alert and oriented, recalls that when he woke up, he could not remember what was going on.  He is calm now, recalls meeting me yesterday, knows why he is in the hospital.  Assessment & Plan:   Principal Problem:   Pleural effusion on right Active Problems:   Diabetes mellitus type 2 with complications (HCC)   Hyponatremia   Essential hypertension   Diabetic peripheral neuropathy (HCC)   Neuropathy   Fall at home, initial encounter   Alcohol abuse   BPH (benign prostatic hyperplasia)    Right loculated pleural effusion - PCCM consulted, chest tube placed 11/1.  Patient accidentally removed 11/2 - Gram stain negative.  Cultures pending - Rocephin , Flagyl, prednisone   - On room air - WBC improving  Hypertension - Norvasc , losartan  Diabetes mellitus with hyperglycemia and neuropathy - A1c 5.7 - Sliding scale insulin . Add meal time novolog  due to hyperglycemia on prednisone   - Neurontin   Alcohol abuse - CIWA protocol - Vitamin B1, folic acid, multivitamin  BPH - Proscar   Pulmonary nodules - New right upper lobe pulmonary nodules measuring up to 1.4 cm and 5 mm; recommend non-contrast chest CT at 3 months, PET/CT, or tissue sampling per Fleischner Society Guidelines. Stable right apical solid pulmonary nodule measuring 11 mm; management as above for most suspicious  nodule.   DVT prophylaxis: SCD Code Status: Full code Family Communication: None at bedside Disposition Plan: Home Status is: Inpatient Remains inpatient appropriate because: On IV antibiotics    Antimicrobials:  Anti-infectives (From admission, onward)    Start     Dose/Rate Route Frequency Ordered Stop   05/15/24 1300  cefTRIAXone  (ROCEPHIN ) 1 g in sodium chloride  0.9 % 100 mL IVPB  Status:  Discontinued        1 g 200 mL/hr over 30 Minutes Intravenous Every 24 hours 05/14/24 1553 05/15/24 0815   05/15/24 0945  cefTRIAXone  (ROCEPHIN ) 2 g in sodium chloride  0.9 % 100 mL IVPB  Status:  Discontinued        2 g 200 mL/hr over 30 Minutes Intravenous  Once 05/15/24 0846 05/15/24 0847   05/15/24 0945  metroNIDAZOLE (FLAGYL) IVPB 500 mg        500 mg 100 mL/hr over 60 Minutes Intravenous Every 12 hours 05/15/24 0846     05/15/24 0945  cefTRIAXone  (ROCEPHIN ) 2 g in sodium chloride  0.9 % 100 mL IVPB  Status:  Discontinued        2 g 200 mL/hr over 30 Minutes Intravenous  Once 05/15/24 0847 05/15/24 0856   05/15/24 0945  cefTRIAXone  (ROCEPHIN ) 2 g in sodium chloride  0.9 % 100 mL IVPB        2 g 200 mL/hr over 30 Minutes Intravenous Every 24 hours 05/15/24 0856     05/14/24 1315  cefTRIAXone  (ROCEPHIN ) 1 g in sodium chloride  0.9 % 100 mL IVPB  1 g 200 mL/hr over 30 Minutes Intravenous  Once 05/14/24 1304 05/14/24 1343   05/14/24 1315  azithromycin  (ZITHROMAX ) 500 mg in sodium chloride  0.9 % 250 mL IVPB        500 mg 250 mL/hr over 60 Minutes Intravenous  Once 05/14/24 1304 05/14/24 1450        Objective: Vitals:   05/15/24 1959 05/15/24 2343 05/16/24 0322 05/16/24 0811  BP: 123/66 123/77 136/68 123/62  Pulse: 96 83 97 99  Resp: 16 18 18    Temp: 97.9 F (36.6 C) (!) 97.5 F (36.4 C) (!) 97.4 F (36.3 C) 97.6 F (36.4 C)  TempSrc: Oral Oral Oral Oral  SpO2: 96% 95% 95% 93%  Weight:      Height:        Intake/Output Summary (Last 24 hours) at 05/16/2024 0957 Last  data filed at 05/16/2024 9376 Gross per 24 hour  Intake 300 ml  Output 4540 ml  Net -4240 ml   Filed Weights   05/14/24 1210  Weight: 86.2 kg    Examination:  General exam: Appears calm and comfortable  Respiratory system: Clear to auscultation anteriorly. Respiratory effort normal. No respiratory distress. No conversational dyspnea.  Cardiovascular system: S1 & S2 heard, RRR. No murmurs. No pedal edema. Gastrointestinal system: Abdomen is nondistended, soft and nontender. Normal bowel sounds heard. Central nervous system: Alert and oriented. No focal neurological deficits. Speech clear.  Extremities: Symmetric in appearance  Skin: No rashes, lesions or ulcers on exposed skin  Psychiatry: Judgement and insight appear normal. Mood & affect appropriate.   Data Reviewed: I have personally reviewed following labs and imaging studies  CBC: Recent Labs  Lab 05/14/24 1225 05/15/24 0555 05/16/24 0532  WBC 18.6* 17.0* 15.6*  NEUTROABS 16.8*  --   --   HGB 11.7* 12.2* 12.5*  HCT 34.2* 33.9* 34.9*  MCV 90.5 89.0 88.8  PLT 313 311 378   Basic Metabolic Panel: Recent Labs  Lab 05/14/24 1225 05/15/24 0555 05/16/24 0532  NA 122* 125* 128*  K 4.5 4.2 3.9  CL 86* 91* 91*  CO2 21* 21* 24  GLUCOSE 243* 264* 201*  BUN 12 10 12   CREATININE 0.62 0.65 0.50*  CALCIUM  8.5* 8.7* 8.6*   GFR: Estimated Creatinine Clearance: 68.4 mL/min (A) (by C-G formula based on SCr of 0.5 mg/dL (L)). Liver Function Tests: Recent Labs  Lab 05/14/24 1225 05/15/24 0555  AST 22 17  ALT 20 16  ALKPHOS 70 63  BILITOT 1.2 0.5  PROT 6.5 6.6  ALBUMIN 3.2* 2.9*   No results for input(s): LIPASE, AMYLASE in the last 168 hours. No results for input(s): AMMONIA in the last 168 hours. Coagulation Profile: No results for input(s): INR, PROTIME in the last 168 hours. Cardiac Enzymes: No results for input(s): CKTOTAL, CKMB, CKMBINDEX, TROPONINI in the last 168 hours. BNP (last 3  results) No results for input(s): PROBNP in the last 8760 hours. HbA1C: Recent Labs    05/15/24 0850  HGBA1C 5.7*   CBG: Recent Labs  Lab 05/15/24 1245 05/15/24 1744 05/15/24 2031 05/15/24 2353 05/16/24 0737  GLUCAP 255* 247* 305* 277* 182*   Lipid Profile: No results for input(s): CHOL, HDL, LDLCALC, TRIG, CHOLHDL, LDLDIRECT in the last 72 hours. Thyroid  Function Tests: Recent Labs    05/14/24 2023  TSH 0.874  FREET4 0.76   Anemia Panel: Recent Labs    05/14/24 1603  VITAMINB12 862  FOLATE 15.1  FERRITIN 80  TIBC 295  IRON 39*  RETICCTPCT 2.0   Sepsis Labs: Recent Labs  Lab 05/14/24 1603 05/14/24 1624  PROCALCITON 3.63  --   LATICACIDVEN  --  1.7    Recent Results (from the past 240 hours)  Resp panel by RT-PCR (RSV, Flu A&B, Covid) Anterior Nasal Swab     Status: None   Collection Time: 05/14/24 12:17 PM   Specimen: Anterior Nasal Swab  Result Value Ref Range Status   SARS Coronavirus 2 by RT PCR NEGATIVE NEGATIVE Final   Influenza A by PCR NEGATIVE NEGATIVE Final   Influenza B by PCR NEGATIVE NEGATIVE Final    Comment: (NOTE) The Xpert Xpress SARS-CoV-2/FLU/RSV plus assay is intended as an aid in the diagnosis of influenza from Nasopharyngeal swab specimens and should not be used as a sole basis for treatment. Nasal washings and aspirates are unacceptable for Xpert Xpress SARS-CoV-2/FLU/RSV testing.  Fact Sheet for Patients: bloggercourse.com  Fact Sheet for Healthcare Providers: seriousbroker.it  This test is not yet approved or cleared by the [place]  FDA and has been authorized for detection and/or diagnosis of SARS-CoV-2 by FDA under an Emergency Use Authorization (EUA). This EUA will remain in effect (meaning this test can be used) for the duration of the COVID-19 declaration under Section 564(b)(1) of the Act, 21 U.S.C. section 360bbb-3(b)(1), unless the authorization  is terminated or revoked.     Resp Syncytial Virus by PCR NEGATIVE NEGATIVE Final    Comment: (NOTE) Fact Sheet for Patients: bloggercourse.com  Fact Sheet for Healthcare Providers: seriousbroker.it  This test is not yet approved or cleared by the [place]  FDA and has been authorized for detection and/or diagnosis of SARS-CoV-2 by FDA under an Emergency Use Authorization (EUA). This EUA will remain in effect (meaning this test can be used) for the duration of the COVID-19 declaration under Section 564(b)(1) of the Act, 21 U.S.C. section 360bbb-3(b)(1), unless the authorization is terminated or revoked.  Performed at [facility], [address], [place], [place] [address]   Culture, body fluid w Gram Stain-bottle     Status: None (Preliminary result)   Collection Time: 05/15/24  9:30 AM   Specimen: Pleura  Result Value Ref Range Status   Specimen Description PLEURAL  Final   Special Requests NONE  Final   Culture   Final    NO GROWTH < 24 HOURS Performed at [facility], [address], [place], [place] [address]    Report Status PENDING  Incomplete  Gram stain     Status: None   Collection Time: 05/15/24  9:30 AM   Specimen: Pleura  Result Value Ref Range Status   Specimen Description PLEURAL  Final   Special Requests NONE  Final   Gram Stain   Final    ABUNDANT WBC PRESENT, PREDOMINANTLY PMN NO ORGANISMS SEEN Performed at [facility], [address], [place], [place] [address]    Report Status 05/15/2024 FINAL  Final      Radiology Studies: DG CHEST PORT 1 VIEW Result Date: 05/16/2024 EXAM: 1 VIEW(S) XRAY OF THE CHEST 05/16/2024 03:50:00 AM COMPARISON: 05/15/2024 CLINICAL HISTORY: Encounter for chest tube removal FINDINGS: LINES, TUBES AND DEVICES: Right chest tube removed. LUNGS AND PLEURA: Persistent small right basilar pneumothorax. Small right pleural effusion. Similar hazy opacity in  right mid lung, likely fluid in fissure. Bibasilar atelectasis. No pulmonary edema. HEART AND MEDIASTINUM: Aortic atherosclerosis. No acute abnormality of the cardiac and mediastinal silhouettes. BONES AND SOFT TISSUES: No acute osseous abnormality.  IMPRESSION: 1. Persistent small right basilar pneumothorax and small right pleural effusion. 2. Similar hazy opacity in right mid lung, likely fluid in fissure. 3. Bibasilar atelectasis. Electronically signed by: Evalene Coho MD 05/16/2024 04:43 AM EST RP Workstation: [id number]   DG CHEST PORT 1 VIEW Result Date: 05/15/2024 CLINICAL DATA:  Pleural effusion EXAM: PORTABLE CHEST 1 VIEW COMPARISON:  05/14/2024 FINDINGS: Single frontal view of the chest demonstrates stable enlargement of the cardiac silhouette. Continued ectasia and atherosclerosis of the thoracic aorta. Interval placement of a pigtail pleural drainage catheter, coiled over the right lower hemithorax. There is marked reduction in the multilocular right pleural effusion seen previously, with a small amount of fluid remaining within the major fissure. There is a trace right basilar pneumothorax likely secondary to chest tube placement. Right basilar consolidation consistent with atelectasis. Left chest is clear. No acute bony abnormalities. IMPRESSION: 1. Near complete resolution of the multilocular right pleural effusion after right pigtail pleural drainage catheter placement. Small amount of residual fluid within the major fissure. 2. Trace right sub pulmonic pneumothorax, likely secondary to chest tube placement. No tension effect or midline shift. 3. Right basilar consolidation consistent with atelectasis. These results will be called to the ordering clinician or representative by the Radiologist Assistant, and communication documented in the PACS or [employer]. Electronically Signed   By: Ozell Daring M.D.   On: 05/15/2024 12:49   CT Angio Chest PE W and/or Wo Contrast Result Date:  05/14/2024 EXAM: CTA of the Chest with contrast for PE 05/14/2024 05:45:00 PM TECHNIQUE: CTA of the chest was performed after the administration of intravenous contrast (iohexol  (OMNIPAQUE ) 350 MG/ML injection 75 mL IOHEXOL  350 MG/ML SOLN). Multiplanar reformatted images are provided for review. MIP images are provided for review. Automated exposure control, iterative reconstruction, and/or weight based adjustment of the mA/kV was utilized to reduce the radiation dose to as low as reasonably achievable. COMPARISON: 05/14/2023 CLINICAL HISTORY: Pulmonary embolism (PE) suspected, high prob. Shortness of breath FINDINGS: PULMONARY ARTERIES: Pulmonary arteries are adequately opacified for evaluation. No pulmonary embolism. Main pulmonary artery is normal in caliber. MEDIASTINUM: Extensive 3-vessel coronary artery disease. The heart and pericardium demonstrate no acute abnormality. Scattered aortic atherosclerosis. There is no acute abnormality of the thoracic aorta. LYMPH NODES: Mildly prominent mediastinal lymph nodes are stable, likely reactive. No hilar or axillary adenopathy. LUNGS AND PLEURA: Right apical nodule measures 11 mm compared to 13 mm previously, not significantly changed. New right upper lobe pulmonary nodule on image 29 measures up to 1.4 cm. New right upper lobe nodule measures 5 mm on image 23. Large loculated right pneumothorax. Compressive atelectasis in the right middle lobe and right lower lobe. Airway thickening noted in the left lower lobe with associated left lower lobe atelectasis. No pleural effusion. UPPER ABDOMEN: Limited images of the upper abdomen are unremarkable. SOFT TISSUES AND BONES: Chronic moderate compression fractures at T8 and L1, stable. No acute bony abnormality. No acute soft tissue abnormality. IMPRESSION: 1. No evidence of pulmonary embolism. 2. Large loculated right pneumothorax with compressive atelectasis in the right middle and lower lobes. 3. New right upper lobe  pulmonary nodules measuring up to 1.4 cm and 5 mm; recommend non-contrast chest CT at 3 months, PET/CT, or tissue sampling per Fleischner Society Guidelines. 4. Stable right apical solid pulmonary nodule measuring 11 mm; management as above for most suspicious nodule. Electronically signed by: Franky Crease MD 05/14/2024 05:54 PM EDT RP Workstation: [id number]   DG Chest Port 1 View Result Date:  05/14/2024 EXAM: 1 VIEW(S) XRAY OF THE CHEST 05/14/2024 12:36:07 PM COMPARISON: Comparison 01/05/2024. CLINICAL HISTORY: SOB SOB. FINDINGS: LUNGS AND PLEURA: Moderate-sized right pleural effusion is noted with associated right basilar atelectasis or infiltrate. Minimal left basilar subsegmental atelectasis is noted. No pulmonary edema. No pneumothorax. HEART AND MEDIASTINUM: No acute abnormality of the cardiac and mediastinal silhouettes. BONES AND SOFT TISSUES: No acute osseous abnormality. IMPRESSION: 1. Moderate right pleural effusion with associated right basilar atelectasis or infiltrate. 2. Minimal left basilar subsegmental atelectasis. Electronically signed by: Lynwood Seip MD 05/14/2024 12:55 PM EDT RP Workstation: [id number]      Scheduled Meds:  amLODipine   5 mg Oral Daily   famotidine   40 mg Oral Daily   feeding supplement  237 mL Oral TID BM   finasteride   5 mg Oral Daily   fluticasone furoate-vilanterol  1 puff Inhalation Daily   folic acid  1 mg Oral Daily   gabapentin   300 mg Oral BID   insulin  aspart  0-5 Units Subcutaneous QHS   insulin  aspart  0-9 Units Subcutaneous TID WC   lidocaine  20 mL Infiltration Once   losartan  100 mg Oral Daily   multivitamin with minerals  1 tablet Oral Daily   predniSONE   40 mg Oral Q breakfast   sodium chloride  flush  10 mL Intrapleural Q8H   sodium chloride  flush  3 mL Intravenous Q12H   thiamine  100 mg Oral Daily   Or   thiamine  100 mg Intravenous Daily   Continuous Infusions:  cefTRIAXone  (ROCEPHIN )  IV 2 g (05/16/24 0828)   metronidazole 500  mg (05/16/24 0827)     LOS: 2 days   Time spent: 25 minutes   Delon Hoe, DO [facility] 05/16/2024, 9:57 AM   Available via Epic secure chat 7am-7pm After these hours, please refer to coverage provider listed on [email or web address]

## 2024-05-16 NOTE — Progress Notes (Signed)
 Interim PCCM Progress Note   Acute hypoxic respiratory failure Loculated pleural effusion likely empyema versus parapneumonic effusion Notified by beside RN staff, that patient had accidentally removed chest tube. Per staff, suspect patient sun downing or having some delirium overnight.  Patient on RA, not in distress, O2 sats 95%. Patient complains of no chest pain  Chest X-ray shows no pneumothorax  Per RN staff, chest tube output during day after insertion ~1600cc Only output on night shift about 30 cc of fluid recorded earlier on during shift Performed beside ultrasound    P: Dressing placed over insertion site-3 sided, no evidence of significant trauma  Obtain another chest X-ray later this morning  From previous ultrasound of right loculated effusion, pocket much larger, now significantly decreased, suspect not needing immediate chest tube placement at this time  Continue to monitor respiratory status, if patient has increase O2 needs, or has any respiratory distress, Please notify PCCM.  PCCM will follow up during day rounds  Sherlean Sharps AGACNP-BC   [facility] 05/16/2024, 4:28 AM  Please see [email or web address] for pager details.  From 7A-7P if no response, please call [phone]. After hours, please call ELink [phone].

## 2024-05-16 NOTE — Progress Notes (Signed)
 NAME:  [name], MRN:  [id number], DOB:  07-08-1936, LOS: 2 ADMISSION DATE:  05/14/2024, CONSULTATION DATE:  05/16/24 REFERRING MD:  Dr Rojelio, CHIEF COMPLAINT:  Shortness of breath   History of Present Illness:  88 year old male with a past medical history of reported asthma/COPD, GERD, hypertension, diabetes, possible rheumatoid arthritis on methotrexate who presented from his assisted living facility with what he reports as an asthma episode for the past 3 days prior to his presentation.  He feels short of breath and weak.  He reports very soft fall from his bed and appeared somewhat disoriented so he was brought into the ER.  In the ER he was noted to be hypoxic satting 80% on room air and was given steroids and nebulized albuterol .  Laboratory workup in the ER showed leukocytosis with a white count of 18.6, hyponatremia with a sodium of 125.  CT chest PE protocol with no evidence of PE but showed right loculated pleural effusion concerning for empyema.  Pulmonary was consulted for further evaluation  Pertinent  Medical History  As mentioned above  Significant Hospital Events: Including procedures, antibiotic start and stop dates in addition to other pertinent events     Interim History / Subjective:  N/a  Objective    Blood pressure 123/62, pulse 99, temperature 97.6 F (36.4 C), temperature source Oral, resp. rate 18, height 5' 10 (1.778 m), weight 86.2 kg, SpO2 93%.    FiO2 (%):  [32 %] 32 %   Intake/Output Summary (Last 24 hours) at 05/16/2024 1133 Last data filed at 05/16/2024 9376 Gross per 24 hour  Intake 300 ml  Output 4540 ml  Net -4240 ml   Filed Weights   05/14/24 1210  Weight: 86.2 kg    Examination: General: Elderly male not in acute distress HENT: Atraumatic normocephalic Lungs: Clear to auscultation bilaterally, minimal decreased air entry in the right lung base Cardiovascular: Regular rate and rhythm, normal S1, normal S2 Abdomen: Soft, nontender,  nondistended Extremities: Warm, well-perfused Neuro: Alert and oriented x 3, slightly more delirious today  Bedside ultrasound of the right pleural space with fluid with few septations and tethering to the diaphragm 05/15/2024    Bedside ultrasound 05/16/2024   Resolved problem list   Assessment and Plan  # Acute hypoxic respiratory failure # Loculated pleural effusion likely empyema versus parapneumonic effusion  Effusion status post chest tube.  Neutrophil predominant exudate likely in the setting of parapneumonic effusion.  Cytology remains pending.  Cultures with no growth to date.  Unfortunately the patient pulled the tube yesterday night.  He has a residual pocket on ultrasound but too small for reinsertion of chest tube.   he also is noted to have fluid that is loculated around the fissure evident on his post chest tube removal x-rays as well as on his prior CT scan.  -Continue to monitor pleural effusion with repeat x-rays and ultrasound as needed - Continue empiric antibiotic coverage, recommend prolonged antibiotic course for 3 weeks total.  Once close to discharge he can be discharged on oral antibiotics such as Augmentin.  Pulmonary will continue to follow   Labs   CBC: Recent Labs  Lab 05/14/24 1225 05/15/24 0555 05/16/24 0532  WBC 18.6* 17.0* 15.6*  NEUTROABS 16.8*  --   --   HGB 11.7* 12.2* 12.5*  HCT 34.2* 33.9* 34.9*  MCV 90.5 89.0 88.8  PLT 313 311 378    Basic Metabolic Panel: Recent Labs  Lab 05/14/24 1225 05/15/24 0555 05/16/24 0532  NA 122* 125* 128*  K 4.5 4.2 3.9  CL 86* 91* 91*  CO2 21* 21* 24  GLUCOSE 243* 264* 201*  BUN 12 10 12   CREATININE 0.62 0.65 0.50*  CALCIUM  8.5* 8.7* 8.6*   GFR: Estimated Creatinine Clearance: 68.4 mL/min (A) (by C-G formula based on SCr of 0.5 mg/dL (L)). Recent Labs  Lab 05/14/24 1225 05/14/24 1603 05/14/24 1624 05/15/24 0555 05/16/24 0532  PROCALCITON  --  3.63  --   --   --   WBC 18.6*  --   --   17.0* 15.6*  LATICACIDVEN  --   --  1.7  --   --     Liver Function Tests: Recent Labs  Lab 05/14/24 1225 05/15/24 0555  AST 22 17  ALT 20 16  ALKPHOS 70 63  BILITOT 1.2 0.5  PROT 6.5 6.6  ALBUMIN 3.2* 2.9*   No results for input(s): LIPASE, AMYLASE in the last 168 hours. No results for input(s): AMMONIA in the last 168 hours.  ABG No results found for: PHART, PCO2ART, PO2ART, HCO3, TCO2, ACIDBASEDEF, O2SAT   Coagulation Profile: No results for input(s): INR, PROTIME in the last 168 hours.  Cardiac Enzymes: No results for input(s): CKTOTAL, CKMB, CKMBINDEX, TROPONINI in the last 168 hours.  HbA1C: Hgb A1c MFr Bld  Date/Time Value Ref Range Status  05/15/2024 08:50 AM 5.7 (H) 4.8 - 5.6 % Final    Comment:    (NOTE) Diagnosis of Diabetes The following HbA1c ranges recommended by the American Diabetes Association (ADA) may be used as an aid in the diagnosis of diabetes mellitus.  Hemoglobin             Suggested A1C NGSP%              Diagnosis  <5.7                   Non Diabetic  5.7-6.4                Pre-Diabetic  >6.4                   Diabetic  <7.0                   Glycemic control for                       adults with diabetes.    09/07/2014 07:10 PM 8.5 (H) 4.8 - 5.6 % Final    Comment:    (NOTE)         Pre-diabetes: 5.7 - 6.4         Diabetes: >6.4         Glycemic control for adults with diabetes: <7.0     CBG: Recent Labs  Lab 05/15/24 1245 05/15/24 1744 05/15/24 2031 05/15/24 2353 05/16/24 0737  GLUCAP 255* 247* 305* 277* 182*    Review of Systems:   As above   Past Medical History:  He,  has a past medical history of Asthma, Bronchitis, COPD (chronic obstructive pulmonary disease) (HCC), Degenerative cervical disc, Diabetes mellitus, Diabetic peripheral neuropathy (HCC) (02/25/2018), Hematoma, Hypercholesterolemia, Hypertension, Iliac artery aneurysm, Lung nodule, Macular degeneration, Peripheral  neuropathy, Progressive gait disorder, Progressive gait disorder (09/15/2013), Skull fracture (HCC), and Subdural hematoma (HCC).   Surgical History:  History reviewed. No pertinent surgical history.   Social History:   reports that he quit smoking about 10 years ago. His smoking use included cigarettes.  He started smoking about 70 years ago. He has a 30 pack-year smoking history. He has never used smokeless tobacco. He reports current alcohol use of about 14.0 standard drinks of alcohol per week. He reports that he does not use drugs.   Family History:  His family history includes Stroke in his father.   Allergies Allergies  Allergen Reactions   Lisinopril  Cough   Other Other (See Comments)   Pneumococcal Vac Polyvalent Other (See Comments)    Fever, sweats   Prednisone  Other (See Comments)    Sweating with high dose   Prevnar 13 [Pneumococcal 13-Val Conj Vacc] Swelling   Penicillins Rash   Sulfa Antibiotics Rash     Home Medications  Prior to Admission medications   Medication Sig Start Date End Date Taking? Authorizing Provider  albuterol  (PROVENTIL  HFA;VENTOLIN  HFA) 108 (90 BASE) MCG/ACT inhaler Inhale 2 puffs into the lungs every 6 (six) hours as needed for wheezing or shortness of breath.   Yes [provider]  amLODipine  (NORVASC ) 5 MG tablet Take 5 mg by mouth daily.   Yes [provider]  Ascorbic Acid  (VITAMIN C ) 1000 MG tablet Take 1,000 mg by mouth daily.   Yes [provider]  cetirizine (ZYRTEC) 10 MG tablet Take 10 mg by mouth at bedtime.   Yes [provider]  Ergocalciferol (VITAMIN D2) 50 MCG (2000 UT) TABS Take 1 tablet by mouth in the morning.   Yes [provider]  famotidine  (PEPCID ) 40 MG tablet Take 40 mg by mouth daily.   Yes [provider]  finasteride  (PROSCAR ) 5 MG tablet Take 5 mg by mouth daily.   Yes [provider]  fluticasone-salmeterol (ADVAIR) 250-50 MCG/ACT AEPB Inhale 1 puff into  the lungs in the morning and at bedtime. 05/05/24  Yes [provider]  folic acid (FOLVITE) 1 MG tablet Take 1 mg by mouth daily.   Yes [provider]  gabapentin  (NEURONTIN ) 300 MG capsule Take 300 mg by mouth 2 (two) times daily.   Yes [provider]  glipiZIDE  (GLUCOTROL  XL) 10 MG 24 hr tablet Take 10 mg by mouth daily. 01/23/20  Yes [provider]  ipratropium-albuterol  (DUONEB) 0.5-2.5 (3) MG/3ML SOLN Take 3 mLs by nebulization every 4 (four) hours as needed for up to 30 doses. 01/05/24  Yes Jerral Meth, MD  losartan (COZAAR) 100 MG tablet Take 100 mg by mouth daily. 03/20/19  Yes [provider]  metFORMIN  (GLUCOPHAGE ) 500 MG tablet Take 1 tablet (500 mg total) by mouth 2 (two) times daily with a meal. Resume tonight. Patient taking differently: Take 500-1,000 mg by mouth See admin instructions. Take 1 tablet (500mg ) by mouth every morning and then take 2 tablets (1000mg ) by mouth every evening. 09/09/14  Yes Krishnan, Gokul, MD  methotrexate 2.5 MG tablet Take 17.5 mg by mouth once a week. Take 7 tablets orally once a week on Tuesdays.   Yes [provider]  Multiple Vitamin (MULTIVITAMIN WITH MINERALS) TABS Take 1 tablet by mouth daily.   Yes [provider]  silodosin (RAPAFLO) 8 MG CAPS capsule Take 8 mg by mouth daily. 02/26/24  Yes [provider]  sodium chloride  1 g tablet Take 1 g by mouth 3 (three) times daily.   Yes [provider]  Zinc Sulfate 220 (50 Zn) MG TABS Take 220 mg by mouth daily.   Yes [provider]  ONE TOUCH ULTRA TEST test strip  09/28/15   [provider]  ONETOUCH DELICA LANCETS 33G MISC  09/28/15   [provider]

## 2024-05-16 NOTE — Plan of Care (Signed)

## 2024-05-16 NOTE — Plan of Care (Signed)
  Problem: Education: Goal: Knowledge of General Education information will improve Description: Including pain rating scale, medication(s)/side effects and non-pharmacologic comfort measures Outcome: Progressing   Problem: Health Behavior/Discharge Planning: Goal: Ability to manage health-related needs will improve Outcome: Progressing   Problem: Clinical Measurements: Goal: Ability to maintain clinical measurements within normal limits will improve Outcome: Progressing Goal: Will remain free from infection Outcome: Progressing Goal: Diagnostic test results will improve Outcome: Progressing Goal: Respiratory complications will improve Outcome: Progressing Goal: Cardiovascular complication will be avoided Outcome: Progressing   Problem: Activity: Goal: Risk for activity intolerance will decrease Outcome: Progressing   Problem: Nutrition: Goal: Adequate nutrition will be maintained Outcome: Progressing   Problem: Coping: Goal: Level of anxiety will decrease Outcome: Progressing   Problem: Elimination: Goal: Will not experience complications related to bowel motility Outcome: Progressing Goal: Will not experience complications related to urinary retention Outcome: Progressing   Problem: Pain Managment: Goal: General experience of comfort will improve and/or be controlled Outcome: Progressing   Problem: Safety: Goal: Ability to remain free from injury will improve Outcome: Progressing   Problem: Skin Integrity: Goal: Risk for impaired skin integrity will decrease Outcome: Progressing   Problem: Education: Goal: Knowledge of disease or condition will improve Outcome: Progressing Goal: Knowledge of the prescribed therapeutic regimen will improve Outcome: Progressing Goal: Individualized Educational Video(s) Outcome: Progressing   Problem: Activity: Goal: Ability to tolerate increased activity will improve Outcome: Progressing Goal: Will verbalize the  importance of balancing activity with adequate rest periods Outcome: Progressing

## 2024-05-16 NOTE — Evaluation (Signed)
 Occupational Therapy Evaluation Patient Details Name: [name] MRN: [id number] DOB: 1936/06/19 Today's Date: 05/16/2024   History of Present Illness   Pt is 88 year old presented to [facility] on  05/14/24 for fall and SOB. Found to have rt loculated pleural effusion. Rt chest tube placed.  PMH - copd, dm, peripheral neuropathy, sdh,     Clinical Impressions Pt admitted based on above, and was seen based on problem list below. Pt reports mod I with ADLs PTA, and living with wife at ALF. Today pt is requiring set up  to total assist for ADLs. Functional transfers are  max  assist, mod assist with RW. No family present for baseline cog, today pt presents with poor stm, attention, problem solving, and online awareness. Given pt's level of assistance required, recommending <3 hours of skilled rehab daily. OT will continue to follow acutely to maximize functional independence.        If plan is discharge home, recommend the following:   A lot of help with walking and/or transfers;A lot of help with bathing/dressing/bathroom     Functional Status Assessment   Patient has had a recent decline in their functional status and demonstrates the ability to make significant improvements in function in a reasonable and predictable amount of time.     Equipment Recommendations   Other (comment) (Defer to next venue)      Precautions/Restrictions   Precautions Precautions: Fall;Other (comment) Recall of Precautions/Restrictions: Impaired Restrictions Weight Bearing Restrictions Per Provider Order: No     Mobility Bed Mobility Overal bed mobility: Needs Assistance Bed Mobility: Supine to Sit, Sit to Supine     Supine to sit: Min assist, HOB elevated Sit to supine: Contact guard assist   General bed mobility comments: Min HH assist for trunk, CGA to return to bed    Transfers Overall transfer level: Needs assistance Equipment used: Rolling walker (2 wheels), 1 person hand  held assist Transfers: Sit to/from Stand, Bed to chair/wheelchair/BSC Sit to Stand: Mod assist     Step pivot transfers: Max assist     General transfer comment: inital STS and SPT max assist with one person [facility] assist d/t pt in soiled linens and lack of RW. Once RW present mod assist for STS, min assist for ~79ft step pivot      Balance Overall balance assessment: Needs assistance Sitting-balance support: No upper extremity supported, Feet supported Sitting balance-Leahy Scale: Good     Standing balance support: Bilateral upper extremity supported, Single extremity supported, During functional activity Standing balance-Leahy Scale: Poor Standing balance comment: reliant on RW       ADL either performed or assessed with clinical judgement   ADL Overall ADL's : Needs assistance/impaired Eating/Feeding: Set up;Sitting   Grooming: Set up;Sitting   Upper Body Bathing: Set up;Sitting   Lower Body Bathing: Sit to/from stand;Moderate assistance   Upper Body Dressing : Set up;Sitting   Lower Body Dressing: Sit to/from stand;Moderate assistance   Toilet Transfer: Moderate assistance;Stand-pivot;BSC/3in1;Rolling walker (2 wheels)   Toileting- Clothing Manipulation and Hygiene: Total assistance;Sit to/from stand Toileting - Clothing Manipulation Details (indicate cue type and reason): Pt using BUE for support     Functional mobility during ADLs: Moderate assistance;Maximal assistance;Rolling walker (2 wheels) General ADL Comments: Pt with poor standing balance     Vision Baseline Vision/History: 1 Wears glasses Vision Assessment?: No apparent visual deficits            Pertinent Vitals/Pain Pain Assessment Pain Assessment: Faces Faces Pain Scale:  Hurts a little bit Pain Location: Ribs Pain Descriptors / Indicators: Grimacing, Guarding Pain Intervention(s): Limited activity within patient's tolerance     Extremity/Trunk Assessment Upper Extremity Assessment Upper  Extremity Assessment: Generalized weakness   Lower Extremity Assessment Lower Extremity Assessment: Defer to PT evaluation       Communication Communication Communication: Impaired Factors Affecting Communication: Hearing impaired   Cognition Arousal: Alert Behavior During Therapy: WFL for tasks assessed/performed Cognition: Cognition impaired, No family/caregiver present to determine baseline     Awareness: Online awareness impaired Memory impairment (select all impairments): Short-term memory Attention impairment (select first level of impairment): Sustained attention, Selective attention Executive functioning impairment (select all impairments): Problem solving, Organization OT - Cognition Comments: Improved ltm, poor stm, attention, decreased insight into deficits     Following commands: Intact       Cueing  General Comments   Cueing Techniques: Tactile cues  Pt on RA, no apparent distress, unable to check vitals d/t managing pt's toileting needs           Home Living Family/patient expects to be discharged to:: Private residence Living Arrangements: Spouse/significant other Available Help at Discharge: Family;Available PRN/intermittently Type of Home: Other(Comment) (ALF)             Bathroom Shower/Tub: Walk-in shower   Bathroom Toilet: Handicapped height Bathroom Accessibility: Yes How Accessible: Accessible via wheelchair Home Equipment: Rollator (4 wheels);Cane - single point;Grab bars - toilet;Grab bars - tub/shower;Hand held shower head;Shower seat - built in          Prior Functioning/Environment Prior Level of Function : Independent/Modified Independent     Mobility Comments: Reports use of SPC in home, rollator for commuity ADLs Comments: Ind    OT Problem List: Decreased strength;Decreased range of motion;Decreased activity tolerance;Impaired balance (sitting and/or standing);Decreased cognition;Decreased safety awareness;Decreased  knowledge of use of DME or AE;Cardiopulmonary status limiting activity   OT Treatment/Interventions: Self-care/ADL training;Therapeutic exercise;DME and/or AE instruction;Energy conservation;Therapeutic activities;Patient/family education;Balance training      OT Goals(Current goals can be found in the care plan section)   Acute Rehab OT Goals Patient Stated Goal: To go home to wife OT Goal Formulation: With patient Time For Goal Achievement: 05/30/24 Potential to Achieve Goals: Good   OT Frequency:  Min 2X/week       AM-PAC OT 6 Clicks Daily Activity     Outcome Measure Help from another person eating meals?: None Help from another person taking care of personal grooming?: A Little Help from another person toileting, which includes using toliet, bedpan, or urinal?: Total Help from another person bathing (including washing, rinsing, drying)?: A Lot Help from another person to put on and taking off regular upper body clothing?: A Little Help from another person to put on and taking off regular lower body clothing?: A Lot 6 Click Score: 15   End of Session Equipment Utilized During Treatment: Gait belt;Rolling walker (2 wheels) Nurse Communication: Mobility status  Activity Tolerance: Patient tolerated treatment well Patient left: in bed;with call bell/phone within reach;with bed alarm set  OT Visit Diagnosis: Unsteadiness on feet (R26.81);Other abnormalities of gait and mobility (R26.89);Muscle weakness (generalized) (M62.81)                Time: 8481-8457 OT Time Calculation (min): 24 min Charges:  OT General Charges $OT Visit: 1 Visit OT Evaluation $OT Eval Moderate Complexity: 1 Mod  Adrianne BROCKS, OT  Acute Rehabilitation Services Office [phone] Secure chat preferred   Adrianne GORMAN Savers 05/16/2024, 4:26 PM

## 2024-05-17 ENCOUNTER — Telehealth: Payer: Self-pay | Admitting: Pulmonary Disease

## 2024-05-17 DIAGNOSIS — R918 Other nonspecific abnormal finding of lung field: Secondary | ICD-10-CM

## 2024-05-17 DIAGNOSIS — J90 Pleural effusion, not elsewhere classified: Secondary | ICD-10-CM | POA: Diagnosis not present

## 2024-05-17 DIAGNOSIS — Z87891 Personal history of nicotine dependence: Secondary | ICD-10-CM

## 2024-05-17 LAB — BASIC METABOLIC PANEL WITH GFR
Anion gap: 10 (ref 5–15)
BUN: 15 mg/dL (ref 8–23)
CO2: 24 mmol/L (ref 22–32)
Calcium: 8.5 mg/dL — ABNORMAL LOW (ref 8.9–10.3)
Chloride: 93 mmol/L — ABNORMAL LOW (ref 98–111)
Creatinine, Ser: 0.46 mg/dL — ABNORMAL LOW (ref 0.61–1.24)
GFR, Estimated: >60 mL/min (ref >60)
Glucose, Bld: 151 mg/dL — ABNORMAL HIGH (ref 70–99)
Potassium: 4.3 mmol/L (ref 3.5–5.1)
Sodium: 127 mmol/L — ABNORMAL LOW (ref 135–145)

## 2024-05-17 LAB — BODY FLUID CELL COUNT WITH DIFFERENTIAL
Eos, Fluid: 4 %
Lymphs, Fluid: 7 %
Monocyte-Macrophage-Serous Fluid: 9 % — ABNORMAL LOW (ref 50–90)
Neutrophil Count, Fluid: 80 % — ABNORMAL HIGH (ref 0–25)
Total Nucleated Cell Count, Fluid: >25914 uL — ABNORMAL HIGH (ref 0–1000)

## 2024-05-17 LAB — CBC
HCT: 33 % — ABNORMAL LOW (ref 39.0–52.0)
Hemoglobin: 11.5 g/dL — ABNORMAL LOW (ref 13.0–17.0)
MCH: 31.3 pg (ref 26.0–34.0)
MCHC: 34.8 g/dL (ref 30.0–36.0)
MCV: 89.9 fL (ref 80.0–100.0)
Platelets: 383 K/uL (ref 150–400)
RBC: 3.67 MIL/uL — ABNORMAL LOW (ref 4.22–5.81)
RDW: 14.4 % (ref 11.5–15.5)
WBC: 14 K/uL — ABNORMAL HIGH (ref 4.0–10.5)
nRBC: 0 % (ref 0.0–0.2)

## 2024-05-17 LAB — GLUCOSE, CAPILLARY
Glucose-Capillary: 165 mg/dL — ABNORMAL HIGH (ref 70–99)
Glucose-Capillary: 270 mg/dL — ABNORMAL HIGH (ref 70–99)

## 2024-05-17 LAB — PATHOLOGIST SMEAR REVIEW

## 2024-05-17 MED ORDER — GUAIFENESIN ER 600 MG PO TB12: 600.0000 mg | ORAL_TABLET | Freq: Two times a day (BID) | ORAL | 0 refills | Status: AC | PRN

## 2024-05-17 MED ORDER — CLINDAMYCIN HCL 300 MG PO CAPS: 300.0000 mg | ORAL_CAPSULE | Freq: Three times a day (TID) | ORAL | 0 refills | Status: AC

## 2024-05-17 NOTE — Progress Notes (Signed)
 AVS completed for discharge packet and placed with chart.

## 2024-05-17 NOTE — Evaluation (Addendum)
 Clinical/Bedside Swallow Evaluation Patient Details  Name: [name] MRN: [id number] Date of Birth: Sep 22, 1935  Today's Date: 05/17/2024 Time: SLP Start Time (ACUTE ONLY): 0919 SLP Stop Time (ACUTE ONLY): 0929 SLP Time Calculation (min) (ACUTE ONLY): 10 min  Past Medical History:  Past Medical History:  Diagnosis Date   Asthma    Bronchitis    COPD (chronic obstructive pulmonary disease) (HCC)    Degenerative cervical disc    Diabetes mellitus    Diabetic peripheral neuropathy (HCC) 02/25/2018   Hematoma    left subdoral   Hypercholesterolemia    Hypertension    Iliac artery aneurysm    Lung nodule    Macular degeneration    Peripheral neuropathy    Progressive gait disorder    Progressive gait disorder 09/15/2013   Skull fracture (HCC)    Subdural hematoma (HCC)    Past Surgical History: History reviewed. No pertinent surgical history. HPI:  Pt is 88 year old presented to [facility] on  05/14/24 for fall and SOB. Found to have rt loculated pleural effusion. Rt chest tube placed. CXR 11/2 Slightly increased asymmetric hazy right lung opacities may  represent layering pleural effusion or atelectasis. Found to have Loculated pleural effusion likely parapneumonic effusion Effusion in the setting of pneumonia.  Chest CT revealed Large loculated right pneumothorax with compressive atelectasis in the right middle and lower lobes. new right upper lobe pulmonary nodules measuring up to 1.4 cm and 5 mm; recommend non-contrast chest CT at 3 months,    PMH - copd, dm, peripheral neuropathy, sdh.    Assessment / Plan / Recommendation  Clinical Impression  Pt alert and eager to go back to ALF today. Pt states he coughs some during the day (without po's) with his asthma and now coughing up phlegm but rarely coughs with food/liquid. Oral abilities functional with intact appearing manipulation and mastication with dentures in place (eats regular texture at baseline with difficulty chewing steak).  He consumed between 2-3 oz water via staw consecutively without immediate s/s aspiration. He did have 2 mild delayed coughs during assessment and stated it may have been due to the dry cracker. He did not exhibit increased work of breathing or respiratory difficulty and states last time he had pna was 2015. He is at slightly higher risk for silent aspiration due to COPD however feel instrumental assessment is needed at this time. Recommend continue regular/thin and educated pt to take breaks when eating/drinking if he notices andy shortness of breath. If he returns to hospital in near future with pna, an MBS may be warranted. No follow up needed presently. SLP Visit Diagnosis: Dysphagia, unspecified (R13.10)    Aspiration Risk  Mild aspiration risk    Diet Recommendation Regular;Thin liquid    Liquid Administration via: Straw;Cup Medication Administration: Whole meds with liquid Supervision: Patient able to self feed Compensations: Slow rate;Small sips/bites Postural Changes: Seated upright at 90 degrees    Other  Recommendations Oral Care Recommendations: Oral care BID     Assistance Recommended at Discharge    Functional Status Assessment Patient has not had a recent decline in their functional status  Frequency and Duration            Prognosis        Swallow Study   General Date of Onset: 05/17/24 HPI: Pt is 88 year old presented to [facility] on  05/14/24 for fall and SOB. Found to have rt loculated pleural effusion. Rt chest tube placed. CXR 11/2 Slightly increased asymmetric  hazy right lung opacities may  represent layering pleural effusion or atelectasis. Found to have Loculated pleural effusion likely parapneumonic effusion Effusion in the setting of pneumonia.  Chest CT revealed Large loculated right pneumothorax with compressive atelectasis in the right middle and lower lobes. new right upper lobe pulmonary nodules measuring up to 1.4 cm and 5 mm; recommend non-contrast chest CT  at 3 months,    PMH - copd, dm, peripheral neuropathy, sdh. Type of Study: Bedside Swallow Evaluation Previous Swallow Assessment:  (none) Diet Prior to this Study: Regular;Thin liquids (Level 0) Temperature Spikes Noted: No Respiratory Status: Room air History of Recent Intubation: No Behavior/Cognition: Alert;Cooperative;Pleasant mood Oral Cavity Assessment: Within Functional Limits Oral Care Completed by SLP: No Oral Cavity - Dentition: Dentures, top;Dentures, bottom Vision: Functional for self-feeding Self-Feeding Abilities: Able to feed self Patient Positioning: Upright in bed Baseline Vocal Quality: Normal Volitional Cough: Strong Volitional Swallow: Able to elicit    Oral/Motor/Sensory Function Overall Oral Motor/Sensory Function: Within functional limits   Ice Chips Ice chips: Not tested   Thin Liquid Thin Liquid: Within functional limits Presentation: Cup;Straw    Nectar Thick Nectar Thick Liquid: Not tested   Honey Thick Honey Thick Liquid: Not tested   Puree Puree: Within functional limits   Solid     Solid: Within functional limits      Dustin Olam Bull 05/17/2024,9:54 AM

## 2024-05-17 NOTE — Discharge Summary (Signed)
 Physician Discharge Summary  [name] FMW:[id number] DOB: 1935-12-28 DOA: 05/14/2024  PCP: System, Provider Not In  Admit date: 05/14/2024 Discharge date: 05/17/2024  Admitted From: Assisted living facility Disposition: Assisted living facility  Recommendations for Outpatient Follow-up:  Follow up with PCP in 1-2 weeks at assisted living facility Please obtain BMP/CBC in one week Pulmonary clinic will schedule follow-up  Home Health: PT/OT Equipment/Devices: Available at home  Discharge Condition: Fair CODE STATUS: Full code Diet recommendation: Low-salt diet, nutritional supplements  Discharge summary: 88 year old gentleman from assisted living facility with medical history of COPD, type 2 diabetes, neuropathy, hyperlipidemia and hypertension presented with generalized weakness and shortness of breath.  He was found to have large loculated right-sided pleural effusion.  Admitted to the hospital with pulmonary consultation.  Patient underwent chest tube placement on 11/1, however it spontaneously fell off.  Blood cultures were negative.  Fluid culture with plenty of WBC, no growth in 48 hours.  Lungs are expanding.  Still has evidence of remaining pleural effusion.  As per pulmonary recommendations, decided to treat conservatively with prolonged antibiotics and follow-up.  Right-sided pneumonia with parapneumonic effusion: Chest tube placed 11/1, spontaneously came off.  Gram stain negative.  Final cultures negative.  Patient was treated with Rocephin  Flagyl and prednisone .  WC count improving.  Blood cultures negative. Patient to be discharged home on clindamycin 300 mg 3 times daily for 4 weeks(patient has allergy to penicillin) He will continue to do incentive spirometry, mucolytic's and mobility with PT OT at home. Patient will follow-up with pulmonary clinic for repeat scan and evaluation. Seen by speech therapy, no obvious evidence of aspiration.  Hyponatremia: Chronic.  At  about his baseline.  Chronic medical issues including Hypertension, blood pressure stable on Norvasc  and losartan.  Continue. Type 2 diabetes, on glipizide .  Continue. BPH, on Proscar .  Continue.  Medically stabilized to go home with close outpatient follow-up.   Discharge Diagnoses:  Principal Problem:   Pleural effusion on right Active Problems:   Diabetes mellitus type 2 with complications (HCC)   Hyponatremia   Essential hypertension   Diabetic peripheral neuropathy (HCC)   Neuropathy   Fall at home, initial encounter   Alcohol abuse   BPH (benign prostatic hyperplasia)    Discharge Instructions  Discharge Instructions     Diet - low sodium heart healthy   Complete by: As directed    Increase activity slowly   Complete by: As directed    No wound care   Complete by: As directed       Allergies as of 05/17/2024       Reactions   Lisinopril  Cough   Other Other (See Comments)   Pneumococcal Vac Polyvalent Other (See Comments)   Fever, sweats   Prednisone  Other (See Comments)   Sweating with high dose   Prevnar 13 [pneumococcal 13-val Conj Vacc] Swelling   Penicillins Rash   Sulfa Antibiotics Rash        Medication List     TAKE these medications    albuterol  108 (90 Base) MCG/ACT inhaler Commonly known as: VENTOLIN  HFA Inhale 2 puffs into the lungs every 6 (six) hours as needed for wheezing or shortness of breath.   amLODipine  5 MG tablet Commonly known as: NORVASC  Take 5 mg by mouth daily.   cetirizine 10 MG tablet Commonly known as: ZYRTEC Take 10 mg by mouth at bedtime.   clindamycin 300 MG capsule Commonly known as: Cleocin Take 1 capsule (300 mg total) by mouth 3 (  three) times daily for 28 days.   famotidine  40 MG tablet Commonly known as: PEPCID  Take 40 mg by mouth daily.   finasteride  5 MG tablet Commonly known as: PROSCAR  Take 5 mg by mouth daily.   fluticasone-salmeterol 250-50 MCG/ACT Aepb Commonly known as: ADVAIR Inhale  1 puff into the lungs in the morning and at bedtime.   folic acid 1 MG tablet Commonly known as: FOLVITE Take 1 mg by mouth daily.   gabapentin  300 MG capsule Commonly known as: NEURONTIN  Take 300 mg by mouth 2 (two) times daily.   glipiZIDE  10 MG 24 hr tablet Commonly known as: GLUCOTROL  XL Take 10 mg by mouth daily.   guaiFENesin  600 MG 12 hr tablet Commonly known as: MUCINEX  Take 1 tablet (600 mg total) by mouth 2 (two) times daily as needed for up to 14 days for cough or to loosen phlegm.   ipratropium-albuterol  0.5-2.5 (3) MG/3ML Soln Commonly known as: DUONEB Take 3 mLs by nebulization every 4 (four) hours as needed for up to 30 doses.   losartan 100 MG tablet Commonly known as: COZAAR Take 100 mg by mouth daily.   metFORMIN  500 MG tablet Commonly known as: GLUCOPHAGE  Take 1 tablet (500 mg total) by mouth 2 (two) times daily with a meal. Resume tonight. What changed:  how much to take when to take this additional instructions   methotrexate 2.5 MG tablet Commonly known as: RHEUMATREX Take 17.5 mg by mouth once a week. Take 7 tablets orally once a week on Tuesdays.   multivitamin with minerals Tabs tablet Take 1 tablet by mouth daily.   ONE TOUCH ULTRA TEST test strip Generic drug: glucose blood   OneTouch Delica Lancets 33G Misc   silodosin 8 MG Caps capsule Commonly known as: RAPAFLO Take 8 mg by mouth daily.   sodium chloride  1 g tablet Take 1 g by mouth 3 (three) times daily.   vitamin C  1000 MG tablet Take 1,000 mg by mouth daily.   Vitamin D2 50 MCG (2000 UT) Tabs Take 1 tablet by mouth in the morning.   Zinc Sulfate 220 (50 Zn) MG Tabs Take 220 mg by mouth daily.        Allergies  Allergen Reactions   Lisinopril  Cough   Other Other (See Comments)   Pneumococcal Vac Polyvalent Other (See Comments)    Fever, sweats   Prednisone  Other (See Comments)    Sweating with high dose   Prevnar 13 [Pneumococcal 13-Val Conj Vacc] Swelling    Penicillins Rash   Sulfa Antibiotics Rash    Consultations: Pulmonary   Procedures/Studies: DG CHEST PORT 1 VIEW Result Date: 05/16/2024 CLINICAL DATA:  Pleural effusion EXAM: PORTABLE CHEST 1 VIEW COMPARISON:  Earlier same day chest radiograph FINDINGS: Normal lung volumes. Slightly increased asymmetric hazy right lung opacities, including pleural effusion loculated along the minor fissure. Trace blunting of right costophrenic angle. No definite pneumothorax. Similar cardiomediastinal silhouette. No acute osseous abnormality. IMPRESSION: 1. Slightly increased asymmetric hazy right lung opacities may represent layering pleural effusion or atelectasis. 2. Similar loculated pleural effusion along the right minor fissure. 3. No definite pneumothorax. Electronically Signed   By: Limin  Xu M.D.   On: 05/16/2024 11:10   DG CHEST PORT 1 VIEW Result Date: 05/16/2024 EXAM: 1 VIEW(S) XRAY OF THE CHEST 05/16/2024 03:50:00 AM COMPARISON: 05/15/2024 CLINICAL HISTORY: Encounter for chest tube removal FINDINGS: LINES, TUBES AND DEVICES: Right chest tube removed. LUNGS AND PLEURA: Persistent small right basilar pneumothorax. Small right pleural  effusion. Similar hazy opacity in right mid lung, likely fluid in fissure. Bibasilar atelectasis. No pulmonary edema. HEART AND MEDIASTINUM: Aortic atherosclerosis. No acute abnormality of the cardiac and mediastinal silhouettes. BONES AND SOFT TISSUES: No acute osseous abnormality. IMPRESSION: 1. Persistent small right basilar pneumothorax and small right pleural effusion. 2. Similar hazy opacity in right mid lung, likely fluid in fissure. 3. Bibasilar atelectasis. Electronically signed by: Evalene Coho MD 05/16/2024 04:43 AM EST RP Workstation: [id number]   DG CHEST PORT 1 VIEW Result Date: 05/15/2024 CLINICAL DATA:  Pleural effusion EXAM: PORTABLE CHEST 1 VIEW COMPARISON:  05/14/2024 FINDINGS: Single frontal view of the chest demonstrates stable enlargement of the  cardiac silhouette. Continued ectasia and atherosclerosis of the thoracic aorta. Interval placement of a pigtail pleural drainage catheter, coiled over the right lower hemithorax. There is marked reduction in the multilocular right pleural effusion seen previously, with a small amount of fluid remaining within the major fissure. There is a trace right basilar pneumothorax likely secondary to chest tube placement. Right basilar consolidation consistent with atelectasis. Left chest is clear. No acute bony abnormalities. IMPRESSION: 1. Near complete resolution of the multilocular right pleural effusion after right pigtail pleural drainage catheter placement. Small amount of residual fluid within the major fissure. 2. Trace right sub pulmonic pneumothorax, likely secondary to chest tube placement. No tension effect or midline shift. 3. Right basilar consolidation consistent with atelectasis. These results will be called to the ordering clinician or representative by the Radiologist Assistant, and communication documented in the PACS or [employer]. Electronically Signed   By: Ozell Daring M.D.   On: 05/15/2024 12:49   CT Angio Chest PE W and/or Wo Contrast Result Date: 05/14/2024 EXAM: CTA of the Chest with contrast for PE 05/14/2024 05:45:00 PM TECHNIQUE: CTA of the chest was performed after the administration of intravenous contrast (iohexol  (OMNIPAQUE ) 350 MG/ML injection 75 mL IOHEXOL  350 MG/ML SOLN). Multiplanar reformatted images are provided for review. MIP images are provided for review. Automated exposure control, iterative reconstruction, and/or weight based adjustment of the mA/kV was utilized to reduce the radiation dose to as low as reasonably achievable. COMPARISON: 05/14/2023 CLINICAL HISTORY: Pulmonary embolism (PE) suspected, high prob. Shortness of breath FINDINGS: PULMONARY ARTERIES: Pulmonary arteries are adequately opacified for evaluation. No pulmonary embolism. Main pulmonary artery is  normal in caliber. MEDIASTINUM: Extensive 3-vessel coronary artery disease. The heart and pericardium demonstrate no acute abnormality. Scattered aortic atherosclerosis. There is no acute abnormality of the thoracic aorta. LYMPH NODES: Mildly prominent mediastinal lymph nodes are stable, likely reactive. No hilar or axillary adenopathy. LUNGS AND PLEURA: Right apical nodule measures 11 mm compared to 13 mm previously, not significantly changed. New right upper lobe pulmonary nodule on image 29 measures up to 1.4 cm. New right upper lobe nodule measures 5 mm on image 23. Large loculated right pneumothorax. Compressive atelectasis in the right middle lobe and right lower lobe. Airway thickening noted in the left lower lobe with associated left lower lobe atelectasis. No pleural effusion. UPPER ABDOMEN: Limited images of the upper abdomen are unremarkable. SOFT TISSUES AND BONES: Chronic moderate compression fractures at T8 and L1, stable. No acute bony abnormality. No acute soft tissue abnormality. IMPRESSION: 1. No evidence of pulmonary embolism. 2. Large loculated right pneumothorax with compressive atelectasis in the right middle and lower lobes. 3. New right upper lobe pulmonary nodules measuring up to 1.4 cm and 5 mm; recommend non-contrast chest CT at 3 months, PET/CT, or tissue sampling per Fleischner Society Guidelines.  4. Stable right apical solid pulmonary nodule measuring 11 mm; management as above for most suspicious nodule. Electronically signed by: Franky Crease MD 05/14/2024 05:54 PM EDT RP Workstation: [id number]   DG Chest Port 1 View Result Date: 05/14/2024 EXAM: 1 VIEW(S) XRAY OF THE CHEST 05/14/2024 12:36:07 PM COMPARISON: Comparison 01/05/2024. CLINICAL HISTORY: SOB SOB. FINDINGS: LUNGS AND PLEURA: Moderate-sized right pleural effusion is noted with associated right basilar atelectasis or infiltrate. Minimal left basilar subsegmental atelectasis is noted. No pulmonary edema. No pneumothorax.  HEART AND MEDIASTINUM: No acute abnormality of the cardiac and mediastinal silhouettes. BONES AND SOFT TISSUES: No acute osseous abnormality. IMPRESSION: 1. Moderate right pleural effusion with associated right basilar atelectasis or infiltrate. 2. Minimal left basilar subsegmental atelectasis. Electronically signed by: Lynwood Seip MD 05/14/2024 12:55 PM EDT RP Workstation: [id number]   (Echo, Carotid, EGD, Colonoscopy, ERCP)    Subjective: Patient seen and examined.  He tells me he feels fine and wants to go home.  Case discussed with pulmonary.  Patient and his wife said that patient gets severe allergic reaction to penicillin so we will avoid it. Son arrived at the bedside.  Care plan discussed. Patient insist that he has some chronic shortness of breath and that his only issue he has now but he denies any other complaints.  Remains afebrile overnight.  On room air.   Discharge Exam: Vitals:   05/17/24 0815 05/17/24 0908  BP:  129/71  Pulse:  97  Resp:    Temp:    SpO2: 94% 91%   Vitals:   05/17/24 0037 05/17/24 0425 05/17/24 0815 05/17/24 0908  BP: 131/80 (!) 142/72  129/71  Pulse: 80 81  97  Resp: 20 20    Temp: 98.2 F (36.8 C) (!) 97.3 F (36.3 C)    TempSrc: Oral Oral    SpO2: 93% 95% 94% 91%  Weight:      Height:        General: Pt is alert, awake, not in acute distress.  Talkative. Cardiovascular: RRR, S1/S2 +, no rubs, no gallops Respiratory: Poor air entry at right base and some conducted upper airway sounds.  On room air.  Looks comfortable. Abdominal: Soft, NT, ND, bowel sounds + Extremities: no edema, no cyanosis    The results of significant diagnostics from this hospitalization (including imaging, microbiology, ancillary and laboratory) are listed below for reference.     Microbiology: Recent Results (from the past 240 hours)  Resp panel by RT-PCR (RSV, Flu A&B, Covid) Anterior Nasal Swab     Status: None   Collection Time: 05/14/24 12:17 PM    Specimen: Anterior Nasal Swab  Result Value Ref Range Status   SARS Coronavirus 2 by RT PCR NEGATIVE NEGATIVE Final   Influenza A by PCR NEGATIVE NEGATIVE Final   Influenza B by PCR NEGATIVE NEGATIVE Final    Comment: (NOTE) The Xpert Xpress SARS-CoV-2/FLU/RSV plus assay is intended as an aid in the diagnosis of influenza from Nasopharyngeal swab specimens and should not be used as a sole basis for treatment. Nasal washings and aspirates are unacceptable for Xpert Xpress SARS-CoV-2/FLU/RSV testing.  Fact Sheet for Patients: bloggercourse.com  Fact Sheet for Healthcare Providers: seriousbroker.it  This test is not yet approved or cleared by the [place]  FDA and has been authorized for detection and/or diagnosis of SARS-CoV-2 by FDA under an Emergency Use Authorization (EUA). This EUA will remain in effect (meaning this test can be used) for the duration of the COVID-19 declaration under Section 564(b)(1)  of the Act, 21 U.S.C. section 360bbb-3(b)(1), unless the authorization is terminated or revoked.     Resp Syncytial Virus by PCR NEGATIVE NEGATIVE Final    Comment: (NOTE) Fact Sheet for Patients: bloggercourse.com  Fact Sheet for Healthcare Providers: seriousbroker.it  This test is not yet approved or cleared by the [place]  FDA and has been authorized for detection and/or diagnosis of SARS-CoV-2 by FDA under an Emergency Use Authorization (EUA). This EUA will remain in effect (meaning this test can be used) for the duration of the COVID-19 declaration under Section 564(b)(1) of the Act, 21 U.S.C. section 360bbb-3(b)(1), unless the authorization is terminated or revoked.  Performed at [facility], [address], [place], [place] [address]   Culture, body fluid w Gram Stain-bottle     Status: None (Preliminary result)   Collection Time: 05/15/24  9:30 AM    Specimen: Pleura  Result Value Ref Range Status   Specimen Description PLEURAL  Final   Special Requests NONE  Final   Culture   Final    NO GROWTH 2 DAYS Performed at [facility], [address], [place], [place] [address]    Report Status PENDING  Incomplete  Gram stain     Status: None   Collection Time: 05/15/24  9:30 AM   Specimen: Pleura  Result Value Ref Range Status   Specimen Description PLEURAL  Final   Special Requests NONE  Final   Gram Stain   Final    ABUNDANT WBC PRESENT, PREDOMINANTLY PMN NO ORGANISMS SEEN Performed at [facility], [address] [place], [place] [address]    Report Status 05/15/2024 FINAL  Final     Labs: BNP (last 3 results) Recent Labs    05/14/24 1225  BNP 68.9   Basic Metabolic Panel: Recent Labs  Lab 05/14/24 1225 05/15/24 0555 05/16/24 0532 05/17/24 0149  NA 122* 125* 128* 127*  K 4.5 4.2 3.9 4.3  CL 86* 91* 91* 93*  CO2 21* 21* 24 24  GLUCOSE 243* 264* 201* 151*  BUN 12 10 12 15   CREATININE 0.62 0.65 0.50* 0.46*  CALCIUM  8.5* 8.7* 8.6* 8.5*   Liver Function Tests: Recent Labs  Lab 05/14/24 1225 05/15/24 0555  AST 22 17  ALT 20 16  ALKPHOS 70 63  BILITOT 1.2 0.5  PROT 6.5 6.6  ALBUMIN 3.2* 2.9*   No results for input(s): LIPASE, AMYLASE in the last 168 hours. No results for input(s): AMMONIA in the last 168 hours. CBC: Recent Labs  Lab 05/14/24 1225 05/15/24 0555 05/16/24 0532 05/17/24 0149  WBC 18.6* 17.0* 15.6* 14.0*  NEUTROABS 16.8*  --   --   --   HGB 11.7* 12.2* 12.5* 11.5*  HCT 34.2* 33.9* 34.9* 33.0*  MCV 90.5 89.0 88.8 89.9  PLT 313 311 378 383   Cardiac Enzymes: No results for input(s): CKTOTAL, CKMB, CKMBINDEX, TROPONINI in the last 168 hours. BNP: Invalid input(s): POCBNP CBG: Recent Labs  Lab 05/16/24 0737 05/16/24 1139 05/16/24 1714 05/16/24 2057 05/17/24 0847  GLUCAP 182* 262* 235* 221* 165*   D-Dimer No results for input(s): DDIMER in the  last 72 hours. Hgb A1c Recent Labs    05/15/24 0850  HGBA1C 5.7*   Lipid Profile No results for input(s): CHOL, HDL, LDLCALC, TRIG, CHOLHDL, LDLDIRECT in the last 72 hours. Thyroid  function studies Recent Labs    05/14/24 2023  TSH 0.874   Anemia work up [employer]    05/14/24  1603  VITAMINB12 862  FOLATE 15.1  FERRITIN 80  TIBC 295  IRON 39*  RETICCTPCT 2.0   Urinalysis    Component Value Date/Time   COLORURINE YELLOW 02/12/2014 1204   APPEARANCEUR CLEAR 02/12/2014 1204   LABSPEC 1.014 02/12/2014 1204   PHURINE 6.5 02/12/2014 1204   GLUCOSEU 100 (A) 02/12/2014 1204   HGBUR NEGATIVE 02/12/2014 1204   BILIRUBINUR NEGATIVE 02/12/2014 1204   KETONESUR NEGATIVE 02/12/2014 1204   PROTEINUR NEGATIVE 02/12/2014 1204   UROBILINOGEN 0.2 02/12/2014 1204   NITRITE NEGATIVE 02/12/2014 1204   LEUKOCYTESUR NEGATIVE 02/12/2014 1204   Sepsis Labs Recent Labs  Lab 05/14/24 1225 05/15/24 0555 05/16/24 0532 05/17/24 0149  WBC 18.6* 17.0* 15.6* 14.0*   Microbiology Recent Results (from the past 240 hours)  Resp panel by RT-PCR (RSV, Flu A&B, Covid) Anterior Nasal Swab     Status: None   Collection Time: 05/14/24 12:17 PM   Specimen: Anterior Nasal Swab  Result Value Ref Range Status   SARS Coronavirus 2 by RT PCR NEGATIVE NEGATIVE Final   Influenza A by PCR NEGATIVE NEGATIVE Final   Influenza B by PCR NEGATIVE NEGATIVE Final    Comment: (NOTE) The Xpert Xpress SARS-CoV-2/FLU/RSV plus assay is intended as an aid in the diagnosis of influenza from Nasopharyngeal swab specimens and should not be used as a sole basis for treatment. Nasal washings and aspirates are unacceptable for Xpert Xpress SARS-CoV-2/FLU/RSV testing.  Fact Sheet for Patients: bloggercourse.com  Fact Sheet for Healthcare Providers: seriousbroker.it  This test is not yet approved or cleared by the [place]  FDA and has been  authorized for detection and/or diagnosis of SARS-CoV-2 by FDA under an Emergency Use Authorization (EUA). This EUA will remain in effect (meaning this test can be used) for the duration of the COVID-19 declaration under Section 564(b)(1) of the Act, 21 U.S.C. section 360bbb-3(b)(1), unless the authorization is terminated or revoked.     Resp Syncytial Virus by PCR NEGATIVE NEGATIVE Final    Comment: (NOTE) Fact Sheet for Patients: bloggercourse.com  Fact Sheet for Healthcare Providers: seriousbroker.it  This test is not yet approved or cleared by the [place]  FDA and has been authorized for detection and/or diagnosis of SARS-CoV-2 by FDA under an Emergency Use Authorization (EUA). This EUA will remain in effect (meaning this test can be used) for the duration of the COVID-19 declaration under Section 564(b)(1) of the Act, 21 U.S.C. section 360bbb-3(b)(1), unless the authorization is terminated or revoked.  Performed at [facility], [address], [place], [place] [address]   Culture, body fluid w Gram Stain-bottle     Status: None (Preliminary result)   Collection Time: 05/15/24  9:30 AM   Specimen: Pleura  Result Value Ref Range Status   Specimen Description PLEURAL  Final   Special Requests NONE  Final   Culture   Final    NO GROWTH 2 DAYS Performed at [facility], [address] [place], [place] [address]    Report Status PENDING  Incomplete  Gram stain     Status: None   Collection Time: 05/15/24  9:30 AM   Specimen: Pleura  Result Value Ref Range Status   Specimen Description PLEURAL  Final   Special Requests NONE  Final   Gram Stain   Final    ABUNDANT WBC PRESENT, PREDOMINANTLY PMN NO ORGANISMS SEEN Performed at [facility], [address], [place], [place] [address]    Report Status 05/15/2024 FINAL  Final     Time coordinating discharge: 40 minutes  SIGNED:   Renato Applebaum,  MD  [facility] 05/17/2024, 11:32 AM

## 2024-05-17 NOTE — Progress Notes (Addendum)
 NAME:  [name], MRN:  [id number], DOB:  May 12, 1936, LOS: 3 ADMISSION DATE:  05/14/2024, CONSULTATION DATE:  05/17/24 REFERRING MD:  Dr Rojelio, CHIEF COMPLAINT:  Shortness of breath   History of Present Illness:  88 year old male with a past medical history of reported asthma/COPD, GERD, hypertension, diabetes, possible rheumatoid arthritis on methotrexate who presented from his assisted living facility with what he reports as an asthma episode for the past 3 days prior to his presentation.  He feels short of breath and weak.  He reports very soft fall from his bed and appeared somewhat disoriented so he was brought into the ER.  In the ER he was noted to be hypoxic satting 80% on room air and was given steroids and nebulized albuterol .  Laboratory workup in the ER showed leukocytosis with a white count of 18.6, hyponatremia with a sodium of 125.  CT chest PE protocol with no evidence of PE but showed right loculated pleural effusion concerning for empyema.  Pulmonary was consulted for further evaluation  Pertinent  Medical History  As mentioned above  Significant Hospital Events: Including procedures, antibiotic start and stop dates in addition to other pertinent events     Interim History / Subjective:  No acute events overnight.  Feels well.  Breathing okay.  Objective    Blood pressure (!) 142/72, pulse 81, temperature (!) 97.3 F (36.3 C), temperature source Oral, resp. rate 20, height 5' 10 (1.778 m), weight 86.2 kg, SpO2 94%.        Intake/Output Summary (Last 24 hours) at 05/17/2024 0854 Last data filed at 05/16/2024 2058 Gross per 24 hour  Intake 206.02 ml  Output 300 ml  Net -93.98 ml   Filed Weights   05/14/24 1210  Weight: 86.2 kg    Examination: General: Send up in chair, in no acute distress HENT: No icterus Lungs: Normal work of breathing, on room air Cardiovascular: Warm Abdomen: Nondistended Neuro: Alert, Mild signs of disorientation albeit transient,  largely oriented  Bedside ultrasound of the right pleural space with fluid with few septations and tethering to the diaphragm 05/15/2024    Bedside ultrasound 05/16/2024   Resolved problem list   Assessment and Plan  Acute hypoxic respiratory failure (resolved) Loculated pleural effusion likely parapneumonic effusion Effusion in the setting of pneumonia.  Gram stain negative.  Presumed parapneumonic.  Status post chest tube placement.  Removed iatrogenically in the setting of delirium by patient.  Serial monitoring with stability.  Small residual effusion not amenable to intervention. -- Recommend discharge with total 4 weeks of antibiotics, clindamycin would be a reasonable choice with penicillin allergy of rash -- Recommend close PCP follow-up   Lung nodules: -- Will arrange outpatient pulmonary follow-up for further evaluation of this, repeat imaging in the future recommended by radiology  Pulmonary will sign off.  Labs   CBC: Recent Labs  Lab 05/14/24 1225 05/15/24 0555 05/16/24 0532 05/17/24 0149  WBC 18.6* 17.0* 15.6* 14.0*  NEUTROABS 16.8*  --   --   --   HGB 11.7* 12.2* 12.5* 11.5*  HCT 34.2* 33.9* 34.9* 33.0*  MCV 90.5 89.0 88.8 89.9  PLT 313 311 378 383    Basic Metabolic Panel: Recent Labs  Lab 05/14/24 1225 05/15/24 0555 05/16/24 0532 05/17/24 0149  NA 122* 125* 128* 127*  K 4.5 4.2 3.9 4.3  CL 86* 91* 91* 93*  CO2 21* 21* 24 24  GLUCOSE 243* 264* 201* 151*  BUN 12 10 12 15   CREATININE  0.62 0.65 0.50* 0.46*  CALCIUM  8.5* 8.7* 8.6* 8.5*   GFR: Estimated Creatinine Clearance: 68.4 mL/min (A) (by C-G formula based on SCr of 0.46 mg/dL (L)). Recent Labs  Lab 05/14/24 1225 05/14/24 1603 05/14/24 1624 05/15/24 0555 05/16/24 0532 05/17/24 0149  PROCALCITON  --  3.63  --   --   --   --   WBC 18.6*  --   --  17.0* 15.6* 14.0*  LATICACIDVEN  --   --  1.7  --   --   --     Liver Function Tests: Recent Labs  Lab 05/14/24 1225 05/15/24 0555   AST 22 17  ALT 20 16  ALKPHOS 70 63  BILITOT 1.2 0.5  PROT 6.5 6.6  ALBUMIN 3.2* 2.9*   No results for input(s): LIPASE, AMYLASE in the last 168 hours. No results for input(s): AMMONIA in the last 168 hours.  ABG No results found for: PHART, PCO2ART, PO2ART, HCO3, TCO2, ACIDBASEDEF, O2SAT   Coagulation Profile: No results for input(s): INR, PROTIME in the last 168 hours.  Cardiac Enzymes: No results for input(s): CKTOTAL, CKMB, CKMBINDEX, TROPONINI in the last 168 hours.  HbA1C: Hgb A1c MFr Bld  Date/Time Value Ref Range Status  05/15/2024 08:50 AM 5.7 (H) 4.8 - 5.6 % Final    Comment:    (NOTE) Diagnosis of Diabetes The following HbA1c ranges recommended by the American Diabetes Association (ADA) may be used as an aid in the diagnosis of diabetes mellitus.  Hemoglobin             Suggested A1C NGSP%              Diagnosis  <5.7                   Non Diabetic  5.7-6.4                Pre-Diabetic  >6.4                   Diabetic  <7.0                   Glycemic control for                       adults with diabetes.    09/07/2014 07:10 PM 8.5 (H) 4.8 - 5.6 % Final    Comment:    (NOTE)         Pre-diabetes: 5.7 - 6.4         Diabetes: >6.4         Glycemic control for adults with diabetes: <7.0     CBG: Recent Labs  Lab 05/16/24 0737 05/16/24 1139 05/16/24 1714 05/16/24 2057 05/17/24 0847  GLUCAP 182* 262* 235* 221* 165*    Review of Systems:   As above   Past Medical History:  He,  has a past medical history of Asthma, Bronchitis, COPD (chronic obstructive pulmonary disease) (HCC), Degenerative cervical disc, Diabetes mellitus, Diabetic peripheral neuropathy (HCC) (02/25/2018), Hematoma, Hypercholesterolemia, Hypertension, Iliac artery aneurysm, Lung nodule, Macular degeneration, Peripheral neuropathy, Progressive gait disorder, Progressive gait disorder (09/15/2013), Skull fracture (HCC), and Subdural hematoma  (HCC).   Surgical History:  History reviewed. No pertinent surgical history.   Social History:   reports that he quit smoking about 10 years ago. His smoking use included cigarettes. He started smoking about 70 years ago. He has a 30 pack-year smoking history. He has never used smokeless tobacco.  He reports current alcohol use of about 14.0 standard drinks of alcohol per week. He reports that he does not use drugs.   Family History:  His family history includes Stroke in his father.   Allergies Allergies  Allergen Reactions   Lisinopril  Cough   Other Other (See Comments)   Pneumococcal Vac Polyvalent Other (See Comments)    Fever, sweats   Prednisone  Other (See Comments)    Sweating with high dose   Prevnar 13 [Pneumococcal 13-Val Conj Vacc] Swelling   Penicillins Rash   Sulfa Antibiotics Rash     Home Medications  Prior to Admission medications   Medication Sig Start Date End Date Taking? Authorizing Provider  albuterol  (PROVENTIL  HFA;VENTOLIN  HFA) 108 (90 BASE) MCG/ACT inhaler Inhale 2 puffs into the lungs every 6 (six) hours as needed for wheezing or shortness of breath.   Yes [provider]  amLODipine  (NORVASC ) 5 MG tablet Take 5 mg by mouth daily.   Yes [provider]  Ascorbic Acid  (VITAMIN C ) 1000 MG tablet Take 1,000 mg by mouth daily.   Yes [provider]  cetirizine (ZYRTEC) 10 MG tablet Take 10 mg by mouth at bedtime.   Yes [provider]  Ergocalciferol (VITAMIN D2) 50 MCG (2000 UT) TABS Take 1 tablet by mouth in the morning.   Yes [provider]  famotidine  (PEPCID ) 40 MG tablet Take 40 mg by mouth daily.   Yes [provider]  finasteride  (PROSCAR ) 5 MG tablet Take 5 mg by mouth daily.   Yes [provider]  fluticasone-salmeterol (ADVAIR) 250-50 MCG/ACT AEPB Inhale 1 puff into the lungs in the morning and at bedtime. 05/05/24  Yes [provider]  folic acid (FOLVITE) 1 MG tablet Take 1 mg  by mouth daily.   Yes [provider]  gabapentin  (NEURONTIN ) 300 MG capsule Take 300 mg by mouth 2 (two) times daily.   Yes [provider]  glipiZIDE  (GLUCOTROL  XL) 10 MG 24 hr tablet Take 10 mg by mouth daily. 01/23/20  Yes [provider]  ipratropium-albuterol  (DUONEB) 0.5-2.5 (3) MG/3ML SOLN Take 3 mLs by nebulization every 4 (four) hours as needed for up to 30 doses. 01/05/24  Yes Jerral Meth, MD  losartan (COZAAR) 100 MG tablet Take 100 mg by mouth daily. 03/20/19  Yes [provider]  metFORMIN  (GLUCOPHAGE ) 500 MG tablet Take 1 tablet (500 mg total) by mouth 2 (two) times daily with a meal. Resume tonight. Patient taking differently: Take 500-1,000 mg by mouth See admin instructions. Take 1 tablet (500mg ) by mouth every morning and then take 2 tablets (1000mg ) by mouth every evening. 09/09/14  Yes Krishnan, Gokul, MD  methotrexate 2.5 MG tablet Take 17.5 mg by mouth once a week. Take 7 tablets orally once a week on Tuesdays.   Yes [provider]  Multiple Vitamin (MULTIVITAMIN WITH MINERALS) TABS Take 1 tablet by mouth daily.   Yes [provider]  silodosin (RAPAFLO) 8 MG CAPS capsule Take 8 mg by mouth daily. 02/26/24  Yes [provider]  sodium chloride  1 g tablet Take 1 g by mouth 3 (three) times daily.   Yes [provider]  Zinc Sulfate 220 (50 Zn) MG TABS Take 220 mg by mouth daily.   Yes [provider]  ONE TOUCH ULTRA TEST test strip  09/28/15   [provider]  AISHA PASTOR LANCETS 33G MISC  09/28/15   [provider]    Donnice JONELLE Beals, MD  See AMION

## 2024-05-17 NOTE — Care Management Important Message (Signed)
 Important Message  Patient Details  Name: [name] MRN: [id number] Date of Birth: 10/07/1935   Important Message Given:  Yes - Medicare IM   Patient left prior to IM delivery will mail a copy to the patient home address.   Dakota Stangl 05/17/2024, 4:22 PM

## 2024-05-17 NOTE — NC FL2 (Addendum)
 [place]  MEDICAID [id number] LEVEL OF CARE FORM     IDENTIFICATION  Patient Name: [name] Birthdate: 1936/01/05 Sex: male Admission Date (Current Location): 05/14/2024  [facility] and [place] Number:  Producer, Television/film/video and Address:  [facility], [address] [place], [place] [address]      Provider Number: [id number]  Attending Physician Name and Address:  Raenelle Coria, MD  Relative Name and Phone Number:  Brailyn Delman, son - [phone]    Current Level of Care: Hospital Recommended Level of Care: Assisted Living Facility Prior Approval Number:    Date Approved/Denied:   PASRR Number:    Discharge Plan: Other (Comment) ([facility])    Current Diagnoses: Patient Active Problem List   Diagnosis Date Noted   Pleural effusion on right 05/15/2024   Alcohol abuse 05/15/2024   BPH (benign prostatic hyperplasia) 05/15/2024   Fall at home, initial encounter 05/14/2024   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   Neuropathy 03/12/2021   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/12/2021   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   Methotrexate, long term, current use 03/12/2021   Neuropathy associated with endocrine disorder 11/01/2018   Tension-type headache, not intractable 04/14/2018   Diabetic peripheral neuropathy (HCC) 02/25/2018   Upper airway cough syndrome 04/20/2015   Essential hypertension 09/13/2014   COPD GOLD II 09/07/2014   Leukocytosis 09/07/2014   Anemia 09/07/2014   Chest tightness 08/26/2014   Diabetes mellitus type 2 with complications (HCC) 04/06/2014   Chronic obstructive pulmonary disease with acute exacerbation (HCC) 04/06/2014   Hyponatremia 04/06/2014   Hyperlipidemia 04/06/2014   CAP (community acquired pneumonia) 04/05/2014   Balance problem 02/15/2014   Benign paroxysmal positional vertigo 02/15/2014   Chronic tension-type headache, not  intractable 02/15/2014   Progressive gait disorder 09/15/2013   Chronic tension headaches 09/15/2013   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   Iliac aneurysm 03/17/2012    Orientation RESPIRATION BLADDER Height & Weight     Self, Time, Situation, Place  Normal Continent Weight: 86.2 kg Height:  5' 10 (177.8 cm)  BEHAVIORAL SYMPTOMS/MOOD NEUROLOGICAL BOWEL NUTRITION STATUS      Continent Diet (See Discharge Summary)  AMBULATORY STATUS COMMUNICATION OF NEEDS Skin   Supervision Verbally Normal                       Personal Care Assistance Level of Assistance  Bathing, Dressing, Total care, Feeding Bathing Assistance: Independent Feeding assistance: Independent Dressing Assistance: Limited assistance     Functional Limitations Info  Sight, Hearing, Speech Sight Info: Impaired Hearing Info: Adequate Speech Info: Adequate    SPECIAL CARE FACTORS FREQUENCY  PT (By licensed PT), OT (By licensed OT)     PT Frequency: 2 x per week OT Frequency: 2 x per week            Contractures Contractures Info: Present    Additional Factors Info  Code Status, Allergies Code Status Info: Full Code Allergies Info: Lisinopril , pneumococcal vaccine, prednisone , prevnar, penicillin, sulfa           Current Medications (05/17/2024):  This is the current hospital active medication list Current Facility-Administered Medications  Medication Dose Route Frequency Provider Last Rate Last Admin   acetaminophen  (TYLENOL ) tablet 650 mg  650 mg Oral Q6H PRN Patel, Ekta V, MD   650 mg at 05/16/24 2211   Or   acetaminophen  (TYLENOL ) suppository 650 mg  650 mg Rectal  Q6H PRN Tobie Hassani GAILS, MD       albuterol  (PROVENTIL ) (2.5 MG/3ML) 0.083% nebulizer solution 2.5 mg  2.5 mg Nebulization Q2H PRN Patel, Ekta V, MD   2.5 mg at 05/16/24 1005   amLODipine  (NORVASC ) tablet 5 mg  5 mg Oral Daily Rojelio Nest, DO   5 mg at 05/17/24 0925   cefTRIAXone  (ROCEPHIN ) 2 g in sodium chloride  0.9 % 100 mL IVPB   2 g Intravenous Q24H Zaida Zola SAILOR, MD   Stopped at 05/16/24 1029   famotidine  (PEPCID ) tablet 40 mg  40 mg Oral Daily Rojelio Nest, DO   40 mg at 05/17/24 0925   feeding supplement (ENSURE PLUS HIGH PROTEIN) liquid 237 mL  237 mL Oral TID BM Rojelio Nest, DO   237 mL at 05/17/24 9071   finasteride  (PROSCAR ) tablet 5 mg  5 mg Oral Daily Rojelio Nest, DO   5 mg at 05/17/24 0925   fluticasone furoate-vilanterol (BREO ELLIPTA) 200-25 MCG/ACT 1 puff  1 puff Inhalation Daily Rojelio Nest, DO   1 puff at 05/17/24 0815   folic acid (FOLVITE) tablet 1 mg  1 mg Oral Daily Patel, Ekta V, MD   1 mg at 05/17/24 9074   gabapentin  (NEURONTIN ) capsule 300 mg  300 mg Oral BID Rojelio Nest, DO   300 mg at 05/17/24 9074   guaiFENesin  (MUCINEX ) 12 hr tablet 600 mg  600 mg Oral BID PRN Patel, Ekta V, MD       hydrALAZINE (APRESOLINE) injection 5 mg  5 mg Intravenous Q4H PRN Patel, Ekta V, MD       insulin  aspart (novoLOG ) injection 0-5 Units  0-5 Units Subcutaneous QHS Howerter, Justin B, DO   2 Units at 05/16/24 2122   insulin  aspart (novoLOG ) injection 0-9 Units  0-9 Units Subcutaneous TID WC Rojelio Nest, DO   2 Units at 05/17/24 9071   insulin  aspart (novoLOG ) injection 3 Units  3 Units Subcutaneous TID WC Rojelio Nest, DO   3 Units at 05/17/24 0929   lidocaine (XYLOCAINE) 1 % (with pres) injection 20 mL  20 mL Infiltration Once Hattar, Zola SAILOR, MD       LORazepam (ATIVAN) tablet 1-4 mg  1-4 mg Oral Q1H PRN Patel, Ekta V, MD       Or   LORazepam (ATIVAN) injection 1-4 mg  1-4 mg Intravenous Q1H PRN Patel, Ekta V, MD       losartan (COZAAR) tablet 100 mg  100 mg Oral Daily Rojelio Nest, DO   100 mg at 05/17/24 9074   metroNIDAZOLE (FLAGYL) IVPB 500 mg  500 mg Intravenous Q12H Hattar, Laith N, MD 100 mL/hr at 05/17/24 0932 500 mg at 05/17/24 0932   multivitamin with minerals tablet 1 tablet  1 tablet Oral Daily Tobie Jermel GAILS, MD   1 tablet at 05/17/24 0925   naphazoline-glycerin (CLEAR EYES  REDNESS) ophth solution 1-2 drop  1-2 drop Left Eye QID PRN Rojelio Nest, DO       predniSONE  (DELTASONE ) tablet 40 mg  40 mg Oral Q breakfast Tobie Vivan V, MD   40 mg at 05/17/24 0926   sodium chloride  flush (NS) 0.9 % injection 10 mL  10 mL Intrapleural Q8H Hattar, Zola SAILOR, MD   10 mL at 05/15/24 2113   sodium chloride  flush (NS) 0.9 % injection 3 mL  3 mL Intravenous Q12H Tobie Dayden GAILS, MD   3 mL at 05/16/24 2124   thiamine (VITAMIN B1) tablet 100 mg  100 mg Oral Daily Patel, Ekta V, MD   100 mg at 05/17/24 9074   Or   thiamine (VITAMIN B1) injection 100 mg  100 mg Intravenous Daily Patel, Ekta V, MD         Discharge Medications: TAKE these medications     albuterol  108 (90 Base) MCG/ACT inhaler Commonly known as: VENTOLIN  HFA Inhale 2 puffs into the lungs every 6 (six) hours as needed for wheezing or shortness of breath.    amLODipine  5 MG tablet Commonly known as: NORVASC  Take 5 mg by mouth daily.    cetirizine 10 MG tablet Commonly known as: ZYRTEC Take 10 mg by mouth at bedtime.    clindamycin 300 MG capsule Commonly known as: Cleocin Take 1 capsule (300 mg total) by mouth 3 (three) times daily for 28 days.    famotidine  40 MG tablet Commonly known as: PEPCID  Take 40 mg by mouth daily.    finasteride  5 MG tablet Commonly known as: PROSCAR  Take 5 mg by mouth daily.    fluticasone-salmeterol 250-50 MCG/ACT Aepb Commonly known as: ADVAIR Inhale 1 puff into the lungs in the morning and at bedtime.    folic acid 1 MG tablet Commonly known as: FOLVITE Take 1 mg by mouth daily.    gabapentin  300 MG capsule Commonly known as: NEURONTIN  Take 300 mg by mouth 2 (two) times daily.    glipiZIDE  10 MG 24 hr tablet Commonly known as: GLUCOTROL  XL Take 10 mg by mouth daily.    guaiFENesin  600 MG 12 hr tablet Commonly known as: MUCINEX  Take 1 tablet (600 mg total) by mouth 2 (two) times daily as needed for up to 14 days for cough or to loosen phlegm.     ipratropium-albuterol  0.5-2.5 (3) MG/3ML Soln Commonly known as: DUONEB Take 3 mLs by nebulization every 4 (four) hours as needed for up to 30 doses.    losartan 100 MG tablet Commonly known as: COZAAR Take 100 mg by mouth daily.    metFORMIN  500 MG tablet Commonly known as: GLUCOPHAGE  Take 1 tablet (500 mg total) by mouth 2 (two) times daily with a meal. Resume tonight. What changed:  how much to take when to take this additional instructions    methotrexate 2.5 MG tablet Commonly known as: RHEUMATREX Take 17.5 mg by mouth once a week. Take 7 tablets orally once a week on Tuesdays.    multivitamin with minerals Tabs tablet Take 1 tablet by mouth daily.    ONE TOUCH ULTRA TEST test strip Generic drug: glucose blood    OneTouch Delica Lancets 33G Misc    silodosin 8 MG Caps capsule Commonly known as: RAPAFLO Take 8 mg by mouth daily.    sodium chloride  1 g tablet Take 1 g by mouth 3 (three) times daily.    vitamin C  1000 MG tablet Take 1,000 mg by mouth daily.    Vitamin D2 50 MCG (2000 UT) Tabs Take 1 tablet by mouth in the morning.    Zinc Sulfate 220 (50 Zn) MG Tabs Take 220 mg by mouth daily.      Relevant Imaging Results:  Relevant Lab Results:   Additional Information SS# [id number]  Rosaline JONELLE Joe, RN

## 2024-05-17 NOTE — Plan of Care (Signed)
  Problem: Education: Goal: Knowledge of General Education information will improve Description: Including pain rating scale, medication(s)/side effects and non-pharmacologic comfort measures Outcome: Completed/Met   Problem: Health Behavior/Discharge Planning: Goal: Ability to manage health-related needs will improve Outcome: Completed/Met   Problem: Clinical Measurements: Goal: Ability to maintain clinical measurements within normal limits will improve Outcome: Completed/Met Goal: Will remain free from infection Outcome: Completed/Met Goal: Diagnostic test results will improve Outcome: Completed/Met Goal: Respiratory complications will improve Outcome: Completed/Met Goal: Cardiovascular complication will be avoided Outcome: Completed/Met   Problem: Activity: Goal: Risk for activity intolerance will decrease Outcome: Completed/Met   Problem: Nutrition: Goal: Adequate nutrition will be maintained Outcome: Completed/Met   Problem: Coping: Goal: Level of anxiety will decrease Outcome: Completed/Met   Problem: Elimination: Goal: Will not experience complications related to bowel motility Outcome: Completed/Met Goal: Will not experience complications related to urinary retention Outcome: Completed/Met   Problem: Pain Managment: Goal: General experience of comfort will improve and/or be controlled Outcome: Completed/Met   Problem: Safety: Goal: Ability to remain free from injury will improve Outcome: Completed/Met   Problem: Skin Integrity: Goal: Risk for impaired skin integrity will decrease Outcome: Completed/Met   Problem: Education: Goal: Knowledge of disease or condition will improve Outcome: Completed/Met Goal: Knowledge of the prescribed therapeutic regimen will improve Outcome: Completed/Met Goal: Individualized Educational Video(s) Outcome: Completed/Met   Problem: Activity: Goal: Ability to tolerate increased activity will improve Outcome:  Completed/Met Goal: Will verbalize the importance of balancing activity with adequate rest periods Outcome: Completed/Met   Problem: Respiratory: Goal: Ability to maintain a clear airway will improve Outcome: Completed/Met Goal: Levels of oxygenation will improve Outcome: Completed/Met Goal: Ability to maintain adequate ventilation will improve Outcome: Completed/Met   Problem: Education: Goal: Ability to describe self-care measures that may prevent or decrease complications (Diabetes Survival Skills Education) will improve Outcome: Completed/Met Goal: Individualized Educational Video(s) Outcome: Completed/Met   Problem: Coping: Goal: Ability to adjust to condition or change in health will improve Outcome: Completed/Met   Problem: Fluid Volume: Goal: Ability to maintain a balanced intake and output will improve Outcome: Completed/Met   Problem: Health Behavior/Discharge Planning: Goal: Ability to identify and utilize available resources and services will improve Outcome: Completed/Met Goal: Ability to manage health-related needs will improve Outcome: Completed/Met   Problem: Metabolic: Goal: Ability to maintain appropriate glucose levels will improve Outcome: Completed/Met   Problem: Nutritional: Goal: Maintenance of adequate nutrition will improve Outcome: Completed/Met Goal: Progress toward achieving an optimal weight will improve Outcome: Completed/Met   Problem: Skin Integrity: Goal: Risk for impaired skin integrity will decrease Outcome: Completed/Met   Problem: Tissue Perfusion: Goal: Adequacy of tissue perfusion will improve Outcome: Completed/Met

## 2024-05-17 NOTE — Telephone Encounter (Signed)
 Appointment scheduled.

## 2024-05-17 NOTE — Telephone Encounter (Signed)
 Please schedule hospital follow-up visit with Dr. Zaida, recent admission with pleural effusion and needs follow-up for lung nodules.  Thank you.

## 2024-05-17 NOTE — Progress Notes (Signed)
 Physical Therapy Treatment Patient Details Name: [name] MRN: [id number] DOB: 05/30/1936 Today's Date: 05/17/2024   History of Present Illness Pt is 88 year old presented to [facility] on  05/14/24 for fall and SOB. Found to have rt loculated pleural effusion. Rt chest tube placed 11/1 and pt accidently dislodged 11/2.  PMH - copd, dm, peripheral neuropathy, sdh,    PT Comments  Pt received in bed, eager to participate in therapy. He required min assist supine to sit, min assist sit to stand, CGA SPT with RW, CGA amb 50' with RW, and supervision sit to supine. Mildly unsteady gait but no overt LOB. He required progressively less assist throughout the session. Pt very vocal in his desire to return to ALF. Updated d/c rec to HHPT.      If plan is discharge home, recommend the following: A little help with walking and/or transfers;A little help with bathing/dressing/bathroom;Assist for transportation   Can travel by private vehicle     Yes  Equipment Recommendations  None recommended by PT    Recommendations for Other Services       Precautions / Restrictions Precautions Precautions: Fall Recall of Precautions/Restrictions: Impaired     Mobility  Bed Mobility Overal bed mobility: Needs Assistance Bed Mobility: Supine to Sit, Sit to Supine     Supine to sit: Min assist, HOB elevated, Used rails Sit to supine: Supervision, HOB elevated   General bed mobility comments: assist to elevate trunk for OOB, no physical assist for return to bed    Transfers Overall transfer level: Needs assistance Equipment used: Rolling walker (2 wheels) Transfers: Sit to/from Stand, Bed to chair/wheelchair/BSC Sit to Stand: Min assist   Step pivot transfers: Contact guard assist       General transfer comment: assist to power up from bed for initial stance. Progressed to CGA on 2nd STS.    Ambulation/Gait Ambulation/Gait assistance: Contact guard assist Gait Distance (Feet): 50  Feet Assistive device: Rolling walker (2 wheels) Gait Pattern/deviations: Step-through pattern, Decreased stride length, Trunk flexed Gait velocity: decreased Gait velocity interpretation: <1.31 ft/sec, indicative of household ambulator   General Gait Details: mildly unsteady but no overt LOB. Pt reports he typically ambulates in shoes due to neuropathy. Pt in gripper socks for session.   Stairs             Wheelchair Mobility     Tilt Bed    Modified Rankin (Stroke Patients Only)       Balance Overall balance assessment: Needs assistance Sitting-balance support: No upper extremity supported, Feet supported Sitting balance-Leahy Scale: Good     Standing balance support: Bilateral upper extremity supported, During functional activity, Reliant on assistive device for balance Standing balance-Leahy Scale: Poor                              Communication Communication Communication: Impaired Factors Affecting Communication: Hearing impaired  Cognition Arousal: Alert Behavior During Therapy: WFL for tasks assessed/performed   PT - Cognitive impairments: Memory, Safety/Judgement                         Following commands: Intact      Cueing Cueing Techniques: Verbal cues  Exercises      General Comments General comments (skin integrity, edema, etc.): VSS on RA      Pertinent Vitals/Pain Pain Assessment Pain Assessment: No/denies pain    Home Living  Prior Function            PT Goals (current goals can now be found in the care plan section) Acute Rehab PT Goals Patient Stated Goal: return home Progress towards PT goals: Progressing toward goals    Frequency    Min 2X/week      PT Plan      Co-evaluation              AM-PAC PT 6 Clicks Mobility   Outcome Measure  Help needed turning from your back to your side while in a flat bed without using bedrails?: A Little Help  needed moving from lying on your back to sitting on the side of a flat bed without using bedrails?: A Little Help needed moving to and from a bed to a chair (including a wheelchair)?: A Little Help needed standing up from a chair using your arms (e.g., wheelchair or bedside chair)?: A Little Help needed to walk in hospital room?: A Little Help needed climbing 3-5 steps with a railing? : A Lot 6 Click Score: 17    End of Session Equipment Utilized During Treatment: Gait belt Activity Tolerance: Patient tolerated treatment well Patient left: in bed;with call bell/phone within reach;with bed alarm set;with family/visitor present Nurse Communication: Mobility status PT Visit Diagnosis: Unsteadiness on feet (R26.81);Muscle weakness (generalized) (M62.81);History of falling (Z91.81);Difficulty in walking, not elsewhere classified (R26.2)     Time: 9056-8989 PT Time Calculation (min) (ACUTE ONLY): 27 min  Charges:    $Gait Training: 8-22 mins $Therapeutic Activity: 8-22 mins PT General Charges $$ ACUTE PT VISIT: 1 Visit                     Sari MATSU., PT  Office # [phone]    Erven Sari Shaker 05/17/2024, 11:01 AM

## 2024-05-17 NOTE — Progress Notes (Addendum)
 Transition of Care [facility]) - Inpatient Brief Assessment   Patient Details  Name: [name] MRN: [id number] Date of Birth: 09/06/1935  Transition of Care [facility]) CM/SW Contact:    Rosaline JONELLE Joe, RN Phone Number: 05/17/2024, 10:36 AM   Clinical Narrative: Patient was seen by PT today and patient is stable to return to the ALF for care.  I called and left a message with Eleanor Flair, CM with [facility].  FL2 will be completed and patient will be set up with [facility] Pt/OT at the facility.  DME at the ALF includes Rolator, Cane and shower seat.  CM will follow up with the facility.  Son to provide transportation to the facility once I am able to coordinate patient's return to the facility.  I called and spoke with Karna, CM at [facility] and she is aware that patient is being discharged back to the ALF - once I speak with Eleanor Flair - FL2 was sent to the facility via fax.  I provided the patient/ son with Medicare choice regarding home health agency and patient prefers [facility] [place] [place] with [facility] accepted the patient for [facility] Pt, Ot services.  Once I am able to speak with Eleanor Flair, CM with [facility] - patient can be transferred by car back to the facility for care.  Bedside nursing was requested to call report to [facility] at [phone].  Discharge summary and FL2 were faxed to the facility to fax # [phone].   Transition of Care Asessment: Insurance and Status: (P) Insurance coverage has been reviewed Patient has primary care physician: (P) Yes Home environment has been reviewed: (P) from [facility] Prior level of function:: (P) care at the assisted living Prior/Current Home Services: (P) No current home services Social Drivers of Health Review: (P) SDOH reviewed needs interventions Readmission risk has been reviewed: (P) Yes Transition of care needs: (P) transition of care needs identified, TOC will continue to follow

## 2024-05-18 DIAGNOSIS — G629 Polyneuropathy, unspecified: Secondary | ICD-10-CM | POA: Diagnosis not present

## 2024-05-18 DIAGNOSIS — M069 Rheumatoid arthritis, unspecified: Secondary | ICD-10-CM | POA: Diagnosis not present

## 2024-05-18 DIAGNOSIS — M6281 Muscle weakness (generalized): Secondary | ICD-10-CM | POA: Diagnosis not present

## 2024-05-18 LAB — CYTOLOGY - NON PAP

## 2024-05-19 DIAGNOSIS — M069 Rheumatoid arthritis, unspecified: Secondary | ICD-10-CM | POA: Diagnosis not present

## 2024-05-19 DIAGNOSIS — M6281 Muscle weakness (generalized): Secondary | ICD-10-CM | POA: Diagnosis not present

## 2024-05-19 DIAGNOSIS — G629 Polyneuropathy, unspecified: Secondary | ICD-10-CM | POA: Diagnosis not present

## 2024-05-20 LAB — CULTURE, BODY FLUID W GRAM STAIN -BOTTLE: Culture: NO GROWTH

## 2024-05-25 ENCOUNTER — Inpatient Hospital Stay

## 2024-05-25 DIAGNOSIS — R2689 Other abnormalities of gait and mobility: Secondary | ICD-10-CM | POA: Diagnosis not present

## 2024-05-25 DIAGNOSIS — M6281 Muscle weakness (generalized): Secondary | ICD-10-CM | POA: Diagnosis not present

## 2024-05-28 DIAGNOSIS — M069 Rheumatoid arthritis, unspecified: Secondary | ICD-10-CM | POA: Diagnosis not present

## 2024-05-28 DIAGNOSIS — J90 Pleural effusion, not elsewhere classified: Secondary | ICD-10-CM | POA: Diagnosis not present

## 2024-05-28 DIAGNOSIS — J189 Pneumonia, unspecified organism: Secondary | ICD-10-CM | POA: Diagnosis not present

## 2024-05-28 DIAGNOSIS — J449 Chronic obstructive pulmonary disease, unspecified: Secondary | ICD-10-CM | POA: Diagnosis not present

## 2024-05-31 ENCOUNTER — Ambulatory Visit (INDEPENDENT_AMBULATORY_CARE_PROVIDER_SITE_OTHER)

## 2024-05-31 ENCOUNTER — Ambulatory Visit

## 2024-05-31 VITALS — BP 111/71 | HR 89 | Temp 98.8°F | Ht 70.0 in | Wt 187.8 lb

## 2024-05-31 DIAGNOSIS — J90 Pleural effusion, not elsewhere classified: Secondary | ICD-10-CM | POA: Diagnosis not present

## 2024-05-31 DIAGNOSIS — R911 Solitary pulmonary nodule: Secondary | ICD-10-CM

## 2024-05-31 DIAGNOSIS — G629 Polyneuropathy, unspecified: Secondary | ICD-10-CM | POA: Diagnosis not present

## 2024-05-31 DIAGNOSIS — M069 Rheumatoid arthritis, unspecified: Secondary | ICD-10-CM | POA: Diagnosis not present

## 2024-05-31 DIAGNOSIS — M6281 Muscle weakness (generalized): Secondary | ICD-10-CM | POA: Diagnosis not present

## 2024-05-31 DIAGNOSIS — R918 Other nonspecific abnormal finding of lung field: Secondary | ICD-10-CM | POA: Diagnosis not present

## 2024-05-31 DIAGNOSIS — I700 Atherosclerosis of aorta: Secondary | ICD-10-CM | POA: Diagnosis not present

## 2024-05-31 NOTE — Progress Notes (Signed)
 Subjective:   PATIENT ID: [name] GENDER: male DOB: 1936/04/07, MRN: [id number]   HPI Discussed the use of AI scribe software for clinical note transcription with the patient, who gave verbal consent to proceed.  History of Present Illness [name] is an 88 year old male who presents for follow-up after hospitalization for pneumonia with pleural effusion.  He was recently hospitalized for pneumonia, during which he developed a pleural effusion requiring drainage with a chest tube. The tube was removed after he accidentally pulled it out, but subsequent ultrasound showed a significant reduction in the fluid pocket. He was discharged with a 28-day course of clindamycin. Since discharge, he feels 'okay' but notes some fatigue and a recent fall at home. No fever, chills, or night sweats. His appetite remains excellent, and he continues to eat well. He reports that he continues to take clindamycin and has not experienced any side effects from the medication.  He experiences occasional coughing. He is cautious while eating and drinking due to his dentures, which necessitate consuming softer foods.  During his recent hospitalization, a CT scan revealed new lung nodules, which were not present in a previous scan from 2024. He does not recall having them before.  He reports bilateral leg swelling, which he associates with prolonged standing and walking. He does not currently take a diuretic and has not been advised to limit salt intake.  He moved from [place]  to his current location and lives in an assisted living facility. 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   Skull fracture (HCC)    Subdural hematoma (HCC)      Family History  Problem Relation Age of Onset   Stroke Father      Social History   Socioeconomic History   Marital status: Married    Spouse name: [name]   Number of children: 3   Years of education: 13   Highest education level: Not on file  Occupational History   Occupation: Retired  Tobacco Use   Smoking status: Former    Current packs/day: 0.00    Average packs/day: 0.5 packs/day for 60.0 years (30.0 ttl pk-yrs)    Types: Cigarettes    Start date: 04/07/1954    Quit date: 04/07/2014    Years since quitting: 10.1   Smokeless tobacco: Never  Vaping Use   Vaping status: Never Used  Substance and Sexual Activity   Alcohol use: Yes    Alcohol/week: 14.0 standard drinks of alcohol    Types: 14 Standard drinks or equivalent per week    Comment: 1-2 before dinner   Drug use: No   Sexual activity: Not on file  Other Topics Concern   Not on file  Social History Narrative   Patient is married [name]).   Patient has three children and 7 grandchildren.   Patient is retired.   Patient drinks  one serving of coffee daily.   Patient is right-handed.   Patient has a college education.   Social Drivers of Corporate Investment Banker Strain: Not on file  Food Insecurity: No Food Insecurity (05/14/2024)   Hunger Vital Sign    Worried About Running Out of Food in the Last Year: Never true    Ran Out of Food in the Last Year: Never true  Transportation Needs: No Transportation Needs (05/14/2024)   PRAPARE - Administrator, Civil Service (Medical): No    Lack of Transportation (Non-Medical): No  Physical Activity: Not on file  Stress: Not on file  Social Connections: Socially Integrated (05/14/2024)    Social Connection and Isolation Panel    Frequency of Communication with Friends and Family: Twice a week    Frequency of Social Gatherings with Friends and Family: Twice a week    Attends Religious Services: 1 to 4 times per year    Active Member of [employer] or Organizations: No    Attends Engineer, Structural: 1 to 4 times per year    Marital Status: Married  Catering Manager Violence: Not At Risk (05/14/2024)   Humiliation, Afraid, Rape, and Kick questionnaire    Fear of Current or Ex-Partner: No    Emotionally Abused: No    Physically Abused: No    Sexually Abused: No     Allergies  Allergen Reactions   Lisinopril  Cough   Other Other (See Comments)   Pneumococcal Vac Polyvalent Other (See Comments)    Fever, sweats   Prednisone  Other (See Comments)    Sweating with high dose   Prevnar 13 [Pneumococcal 13-Val Conj Vacc] Swelling   Penicillins Rash   Sulfa Antibiotics Rash     Outpatient Medications Prior to Visit  Medication Sig Dispense Refill   albuterol  (PROVENTIL  HFA;VENTOLIN  HFA) 108 (90 BASE) MCG/ACT inhaler Inhale 2 puffs into the lungs every 6 (six) hours as needed for wheezing or shortness of breath.     amLODipine  (NORVASC ) 5 MG tablet Take 5 mg by mouth daily.     Ascorbic Acid  (VITAMIN C ) 1000 MG tablet Take 1,000 mg by mouth daily.     cetirizine (ZYRTEC) 10 MG tablet Take 10 mg by mouth at bedtime.     clindamycin (CLEOCIN) 300 MG capsule Take 1 capsule (300 mg total) by mouth 3 (three) times daily for 28 days. 84 capsule 0   Ergocalciferol (VITAMIN D2) 50 MCG (2000 UT) TABS Take 1 tablet by mouth in the morning.     famotidine  (PEPCID ) 40 MG tablet Take 40 mg by mouth daily.     finasteride  (PROSCAR ) 5 MG tablet Take 5 mg by mouth daily.     fluticasone-salmeterol (ADVAIR) 250-50 MCG/ACT AEPB Inhale 1 puff into the lungs in the morning and at bedtime.     folic acid (FOLVITE) 1 MG tablet Take 1 mg by mouth daily.     gabapentin  (NEURONTIN ) 300 MG  capsule Take 300 mg by mouth 2 (two) times daily.     glipiZIDE  (GLUCOTROL  XL) 10 MG 24 hr tablet Take 10 mg by mouth daily.     guaiFENesin  (MUCINEX ) 600 MG 12 hr tablet Take 1 tablet (600 mg total) by mouth 2 (two) times daily as needed for up to 14 days for cough or to loosen phlegm. 28 tablet 0   ipratropium-albuterol  (DUONEB) 0.5-2.5 (3) MG/3ML SOLN Take 3 mLs by nebulization every 4 (four) hours as needed for up to 30  doses. 90 mL 0   losartan (COZAAR) 100 MG tablet Take 100 mg by mouth daily.     metFORMIN  (GLUCOPHAGE ) 500 MG tablet Take 1 tablet (500 mg total) by mouth 2 (two) times daily with a meal. Resume tonight.     methotrexate 2.5 MG tablet Take 17.5 mg by mouth once a week. Take 7 tablets orally once a week on Tuesdays.     Multiple Vitamin (MULTIVITAMIN WITH MINERALS) TABS Take 1 tablet by mouth daily.     ONE TOUCH ULTRA TEST test strip      ONETOUCH DELICA LANCETS 33G MISC      silodosin (RAPAFLO) 8 MG CAPS capsule Take 8 mg by mouth daily.     sodium chloride  1 g tablet Take 1 g by mouth 3 (three) times daily.     Zinc Sulfate 220 (50 Zn) MG TABS Take 220 mg by mouth daily.     No facility-administered medications prior to visit.    ROS Reviewed all systems and reported negative except as above     Objective:   Vitals:   05/31/24 1601  BP: 111/71  Pulse: 89  Temp: 98.8 F (37.1 C)  TempSrc: Oral  SpO2: 94%  Weight: 187 lb 12.8 oz (85.2 kg)  Height: 5' 10 (1.778 m)    Physical Exam Physical Exam GENERAL: Appropriate to age, no acute distress. HEAD EYES EARS NOSE THROAT: Moist mucous membranes, atraumatic, normocephalic. CHEST: Crackles on left lung, clear to auscultation on right lung, no wheezing, no rales. CARDIAC: Regular rate and rhythm, normal S1, normal S2, no murmurs, no rubs, no gallops. ABDOMEN: Soft, nontender. NEUROLOGICAL: Motor and sensation grossly intact, alert and oriented times X 3. EXTREMITIES: Warm, well perfused, edema in both  legs.     CBC    Component Value Date/Time   WBC 14.0 (H) 05/17/2024 0149   RBC 3.67 (L) 05/17/2024 0149   HGB 11.5 (L) 05/17/2024 0149   HGB 13.3 03/12/2021 1623   HCT 33.0 (L) 05/17/2024 0149   HCT 39.7 03/12/2021 1623   PLT 383 05/17/2024 0149   PLT 263 03/12/2021 1623   MCV 89.9 05/17/2024 0149   MCV 94 03/12/2021 1623   MCH 31.3 05/17/2024 0149   MCHC 34.8 05/17/2024 0149   RDW 14.4 05/17/2024 0149   RDW 14.8 03/12/2021 1623   LYMPHSABS 0.6 (L) 05/14/2024 1225   LYMPHSABS 1.2 03/12/2021 1623   MONOABS 1.1 (H) 05/14/2024 1225   EOSABS 0.0 05/14/2024 1225   EOSABS 0.1 03/12/2021 1623   BASOSABS 0.0 05/14/2024 1225   BASOSABS 0.1 03/12/2021 1623     Chest imaging:  PFT:    Latest Ref Rng & Units 11/09/2015   10:57 AM 11/30/2013    3:43 PM  PFT Results  FVC-Pre L 2.94  2.87   FVC-Predicted Pre % 71  68   FVC-Post L 3.03  3.03   FVC-Predicted Post % 73  71   Pre FEV1/FVC % % 59  59   Post FEV1/FCV % % 61  58   FEV1-Pre L 1.73  1.68   FEV1-Predicted Pre % 58  55   FEV1-Post L 1.86  1.76   DLCO uncorrected ml/min/mmHg 21.39  25.25   DLCO UNC% % 66  78   DLCO corrected ml/min/mmHg 21.51    DLCO COR %Predicted % 66    DLVA Predicted % 71  78   TLC L 8.72  7.72   TLC % Predicted % 123  109  RV % Predicted % 207  178     Labs:    Echo:       Assessment & Plan:   Assessment and Plan Assessment & Plan Right loculated pleural effusion (parapneumonic/empyema) status post drainage, on prolonged antibiotics Status post drainage with small residual fluid pocket. On clindamycin without significant side effects. Coughing present, aiding secretion clearance. - Continue clindamycin as prescribed. - Ordered repeat chest x-ray to assess fluid status. - Advised to report new symptoms such as increased shortness of breath, fever, or chills post-antibiotics.  Solitary pulmonary nodule, right upper lobe, under surveillance Solitary pulmonary nodule <1.5 cm,  differential includes infection-related changes vs. malignancy. Surveillance preferred over biopsy. - Ordered repeat CT scan in three months to monitor nodule size and characteristics.  Bilateral lower extremity edema, possible heart failure Bilateral lower extremity edema, possibly heart failure. No diuretics used. - Advised discussion with primary care provider for evaluation and management, including diuretics consideration. - Recommended reducing salt intake to manage fluid retention.        Zola Herter, MD [facility] Office: [phone]

## 2024-05-31 NOTE — Patient Instructions (Signed)
  VISIT SUMMARY: You had a follow-up appointment today after your recent hospitalization for pneumonia with pleural effusion. You mentioned feeling okay overall but noted some fatigue and a recent fall. You are continuing your prescribed medication without any side effects. We discussed your current symptoms and reviewed your recent medical history, including new findings from your recent CT scan.  YOUR PLAN: -RIGHT LOCULATED PLEURAL EFFUSION (PARAPNEUMONIC/EMPYEMA): This is a condition where infected fluid collects in the space around your lungs. You are currently on clindamycin to treat this infection. Please continue taking clindamycin as prescribed. We have ordered a repeat chest x-ray to check the fluid status. Report any new symptoms such as increased shortness of breath, fever, or chills after finishing the antibiotics.  -SOLITARY PULMONARY NODULE, RIGHT UPPER LOBE: A solitary pulmonary nodule is a small, round growth in the lung. It can be due to an infection or, less commonly, cancer. We will monitor this nodule with a repeat CT scan in three months to see if there are any changes.  -BILATERAL LOWER EXTREMITY EDEMA, POSSIBLE HEART FAILURE: This is swelling in both legs, which could be a sign of heart failure. We recommend discussing this with your primary care provider to evaluate and manage it, possibly with diuretics. Reducing your salt intake can also help manage the fluid retention.  INSTRUCTIONS: Please follow up with your primary care provider to discuss the swelling in your legs and consider the use of diuretics. Continue taking clindamycin as prescribed and report any new symptoms such as increased shortness of breath, fever, or chills after finishing the antibiotics. We have scheduled a repeat chest x-ray to assess your fluid status and a repeat CT scan in three months to monitor the lung nodule.                         Contains text generated by [employer]bridge.

## 2024-06-02 DIAGNOSIS — M069 Rheumatoid arthritis, unspecified: Secondary | ICD-10-CM | POA: Diagnosis not present

## 2024-06-02 DIAGNOSIS — M6281 Muscle weakness (generalized): Secondary | ICD-10-CM | POA: Diagnosis not present

## 2024-06-02 DIAGNOSIS — G629 Polyneuropathy, unspecified: Secondary | ICD-10-CM | POA: Diagnosis not present

## 2024-06-02 DIAGNOSIS — R053 Chronic cough: Secondary | ICD-10-CM | POA: Diagnosis not present

## 2024-06-02 DIAGNOSIS — J45909 Unspecified asthma, uncomplicated: Secondary | ICD-10-CM | POA: Diagnosis not present

## 2024-06-07 DIAGNOSIS — G629 Polyneuropathy, unspecified: Secondary | ICD-10-CM | POA: Diagnosis not present

## 2024-06-07 DIAGNOSIS — M6281 Muscle weakness (generalized): Secondary | ICD-10-CM | POA: Diagnosis not present

## 2024-06-07 DIAGNOSIS — M069 Rheumatoid arthritis, unspecified: Secondary | ICD-10-CM | POA: Diagnosis not present

## 2024-06-08 DIAGNOSIS — R2689 Other abnormalities of gait and mobility: Secondary | ICD-10-CM | POA: Diagnosis not present

## 2024-06-08 DIAGNOSIS — M6281 Muscle weakness (generalized): Secondary | ICD-10-CM | POA: Diagnosis not present

## 2024-06-09 DIAGNOSIS — M069 Rheumatoid arthritis, unspecified: Secondary | ICD-10-CM | POA: Diagnosis not present

## 2024-06-09 DIAGNOSIS — M6281 Muscle weakness (generalized): Secondary | ICD-10-CM | POA: Diagnosis not present

## 2024-06-09 DIAGNOSIS — G629 Polyneuropathy, unspecified: Secondary | ICD-10-CM | POA: Diagnosis not present

## 2024-06-11 DIAGNOSIS — M069 Rheumatoid arthritis, unspecified: Secondary | ICD-10-CM | POA: Diagnosis not present

## 2024-06-11 DIAGNOSIS — M6281 Muscle weakness (generalized): Secondary | ICD-10-CM | POA: Diagnosis not present

## 2024-06-11 DIAGNOSIS — G629 Polyneuropathy, unspecified: Secondary | ICD-10-CM | POA: Diagnosis not present

## 2024-06-14 DIAGNOSIS — G629 Polyneuropathy, unspecified: Secondary | ICD-10-CM | POA: Diagnosis not present

## 2024-06-14 DIAGNOSIS — M069 Rheumatoid arthritis, unspecified: Secondary | ICD-10-CM | POA: Diagnosis not present

## 2024-06-14 DIAGNOSIS — M6281 Muscle weakness (generalized): Secondary | ICD-10-CM | POA: Diagnosis not present

## 2024-06-15 DIAGNOSIS — M6281 Muscle weakness (generalized): Secondary | ICD-10-CM | POA: Diagnosis not present

## 2024-06-15 DIAGNOSIS — R2689 Other abnormalities of gait and mobility: Secondary | ICD-10-CM | POA: Diagnosis not present

## 2024-06-16 DIAGNOSIS — G629 Polyneuropathy, unspecified: Secondary | ICD-10-CM | POA: Diagnosis not present

## 2024-06-16 DIAGNOSIS — M069 Rheumatoid arthritis, unspecified: Secondary | ICD-10-CM | POA: Diagnosis not present

## 2024-06-16 DIAGNOSIS — M6281 Muscle weakness (generalized): Secondary | ICD-10-CM | POA: Diagnosis not present

## 2024-06-18 DIAGNOSIS — M069 Rheumatoid arthritis, unspecified: Secondary | ICD-10-CM | POA: Diagnosis not present

## 2024-06-18 DIAGNOSIS — G629 Polyneuropathy, unspecified: Secondary | ICD-10-CM | POA: Diagnosis not present

## 2024-06-22 DIAGNOSIS — M069 Rheumatoid arthritis, unspecified: Secondary | ICD-10-CM | POA: Diagnosis not present

## 2024-06-22 DIAGNOSIS — M6281 Muscle weakness (generalized): Secondary | ICD-10-CM | POA: Diagnosis not present

## 2024-06-22 DIAGNOSIS — G629 Polyneuropathy, unspecified: Secondary | ICD-10-CM | POA: Diagnosis not present

## 2024-06-23 DIAGNOSIS — G629 Polyneuropathy, unspecified: Secondary | ICD-10-CM | POA: Diagnosis not present

## 2024-06-23 DIAGNOSIS — M069 Rheumatoid arthritis, unspecified: Secondary | ICD-10-CM | POA: Diagnosis not present

## 2024-06-23 DIAGNOSIS — M6281 Muscle weakness (generalized): Secondary | ICD-10-CM | POA: Diagnosis not present

## 2024-06-24 DIAGNOSIS — E871 Hypo-osmolality and hyponatremia: Secondary | ICD-10-CM | POA: Diagnosis not present

## 2024-06-24 DIAGNOSIS — Z79899 Other long term (current) drug therapy: Secondary | ICD-10-CM | POA: Diagnosis not present

## 2024-06-24 DIAGNOSIS — M069 Rheumatoid arthritis, unspecified: Secondary | ICD-10-CM | POA: Diagnosis not present

## 2024-08-13 ENCOUNTER — Other Ambulatory Visit

## 2024-09-08 ENCOUNTER — Ambulatory Visit
